# Patient Record
Sex: Female | Born: 1955
Health system: Southern US, Community
[De-identification: ages and names within clinical notes are randomized; demographics above are authoritative.]

## PROBLEM LIST (undated history)

## (undated) DIAGNOSIS — M51369 Other intervertebral disc degeneration, lumbar region without mention of lumbar back pain or lower extremity pain: Secondary | ICD-10-CM

## (undated) DIAGNOSIS — H6122 Impacted cerumen, left ear: Secondary | ICD-10-CM

## (undated) DIAGNOSIS — E785 Hyperlipidemia, unspecified: Secondary | ICD-10-CM

## (undated) DIAGNOSIS — M5136 Other intervertebral disc degeneration, lumbar region: Secondary | ICD-10-CM

## (undated) HISTORY — DX: Impacted cerumen, left ear: H61.22

## (undated) HISTORY — DX: Hyperlipidemia, unspecified: E78.5

## (undated) HISTORY — PX: TONSILLECTOMY: SUR1361

## (undated) HISTORY — DX: Other intervertebral disc degeneration, lumbar region without mention of lumbar back pain or lower extremity pain: M51.369

## (undated) HISTORY — PX: ABDOMINAL HYSTERECTOMY: SHX81

## (undated) HISTORY — PX: LAPAROTOMY: SHX154

## (undated) HISTORY — PX: APPENDECTOMY: SHX54

## (undated) HISTORY — DX: Other intervertebral disc degeneration, lumbar region: M51.36

## (undated) HISTORY — PX: JOINT REPLACEMENT: SHX530

---

## 2011-08-15 DIAGNOSIS — R609 Edema, unspecified: Secondary | ICD-10-CM | POA: Diagnosis not present

## 2011-08-17 DIAGNOSIS — N289 Disorder of kidney and ureter, unspecified: Secondary | ICD-10-CM | POA: Diagnosis not present

## 2011-08-17 DIAGNOSIS — R609 Edema, unspecified: Secondary | ICD-10-CM | POA: Diagnosis not present

## 2011-08-17 DIAGNOSIS — N23 Unspecified renal colic: Secondary | ICD-10-CM | POA: Diagnosis not present

## 2011-08-18 DIAGNOSIS — M7989 Other specified soft tissue disorders: Secondary | ICD-10-CM | POA: Diagnosis not present

## 2011-08-28 DIAGNOSIS — M7989 Other specified soft tissue disorders: Secondary | ICD-10-CM | POA: Diagnosis not present

## 2011-08-28 DIAGNOSIS — R609 Edema, unspecified: Secondary | ICD-10-CM | POA: Diagnosis not present

## 2011-09-05 DIAGNOSIS — R609 Edema, unspecified: Secondary | ICD-10-CM | POA: Diagnosis not present

## 2011-09-19 DIAGNOSIS — R609 Edema, unspecified: Secondary | ICD-10-CM | POA: Diagnosis not present

## 2011-09-27 DIAGNOSIS — F3131 Bipolar disorder, current episode depressed, mild: Secondary | ICD-10-CM | POA: Diagnosis not present

## 2011-10-03 DIAGNOSIS — K219 Gastro-esophageal reflux disease without esophagitis: Secondary | ICD-10-CM | POA: Diagnosis not present

## 2011-10-03 DIAGNOSIS — R609 Edema, unspecified: Secondary | ICD-10-CM | POA: Diagnosis not present

## 2011-10-16 DIAGNOSIS — R609 Edema, unspecified: Secondary | ICD-10-CM | POA: Diagnosis not present

## 2011-10-24 DIAGNOSIS — F3131 Bipolar disorder, current episode depressed, mild: Secondary | ICD-10-CM | POA: Diagnosis not present

## 2011-10-31 DIAGNOSIS — R609 Edema, unspecified: Secondary | ICD-10-CM | POA: Diagnosis not present

## 2012-01-05 DIAGNOSIS — R609 Edema, unspecified: Secondary | ICD-10-CM | POA: Diagnosis not present

## 2012-01-05 DIAGNOSIS — R079 Chest pain, unspecified: Secondary | ICD-10-CM | POA: Diagnosis not present

## 2012-01-06 DIAGNOSIS — R079 Chest pain, unspecified: Secondary | ICD-10-CM | POA: Diagnosis not present

## 2012-01-10 DIAGNOSIS — I872 Venous insufficiency (chronic) (peripheral): Secondary | ICD-10-CM | POA: Diagnosis not present

## 2012-01-10 DIAGNOSIS — R638 Other symptoms and signs concerning food and fluid intake: Secondary | ICD-10-CM | POA: Diagnosis not present

## 2012-01-10 DIAGNOSIS — F17200 Nicotine dependence, unspecified, uncomplicated: Secondary | ICD-10-CM | POA: Diagnosis not present

## 2012-01-10 DIAGNOSIS — E7800 Pure hypercholesterolemia, unspecified: Secondary | ICD-10-CM | POA: Diagnosis not present

## 2012-01-15 DIAGNOSIS — N3289 Other specified disorders of bladder: Secondary | ICD-10-CM | POA: Diagnosis not present

## 2012-01-18 DIAGNOSIS — E785 Hyperlipidemia, unspecified: Secondary | ICD-10-CM | POA: Diagnosis not present

## 2012-01-18 DIAGNOSIS — I10 Essential (primary) hypertension: Secondary | ICD-10-CM | POA: Diagnosis not present

## 2012-01-18 DIAGNOSIS — F17200 Nicotine dependence, unspecified, uncomplicated: Secondary | ICD-10-CM | POA: Diagnosis not present

## 2012-01-18 DIAGNOSIS — I872 Venous insufficiency (chronic) (peripheral): Secondary | ICD-10-CM | POA: Diagnosis not present

## 2012-01-18 DIAGNOSIS — F319 Bipolar disorder, unspecified: Secondary | ICD-10-CM | POA: Diagnosis not present

## 2012-01-22 DIAGNOSIS — F3131 Bipolar disorder, current episode depressed, mild: Secondary | ICD-10-CM | POA: Diagnosis not present

## 2012-01-29 DIAGNOSIS — F39 Unspecified mood [affective] disorder: Secondary | ICD-10-CM | POA: Diagnosis not present

## 2012-02-01 DIAGNOSIS — R112 Nausea with vomiting, unspecified: Secondary | ICD-10-CM | POA: Diagnosis not present

## 2012-02-01 DIAGNOSIS — R638 Other symptoms and signs concerning food and fluid intake: Secondary | ICD-10-CM | POA: Diagnosis not present

## 2012-02-01 DIAGNOSIS — I10 Essential (primary) hypertension: Secondary | ICD-10-CM | POA: Diagnosis not present

## 2012-02-01 DIAGNOSIS — F319 Bipolar disorder, unspecified: Secondary | ICD-10-CM | POA: Diagnosis not present

## 2012-02-01 DIAGNOSIS — R609 Edema, unspecified: Secondary | ICD-10-CM | POA: Diagnosis not present

## 2012-02-01 DIAGNOSIS — F17200 Nicotine dependence, unspecified, uncomplicated: Secondary | ICD-10-CM | POA: Diagnosis not present

## 2012-02-05 DIAGNOSIS — F39 Unspecified mood [affective] disorder: Secondary | ICD-10-CM | POA: Diagnosis not present

## 2012-02-07 DIAGNOSIS — M549 Dorsalgia, unspecified: Secondary | ICD-10-CM | POA: Diagnosis not present

## 2012-02-09 DIAGNOSIS — R1031 Right lower quadrant pain: Secondary | ICD-10-CM | POA: Diagnosis not present

## 2012-02-13 DIAGNOSIS — F17200 Nicotine dependence, unspecified, uncomplicated: Secondary | ICD-10-CM | POA: Diagnosis not present

## 2012-02-13 DIAGNOSIS — R609 Edema, unspecified: Secondary | ICD-10-CM | POA: Diagnosis not present

## 2012-02-13 DIAGNOSIS — M549 Dorsalgia, unspecified: Secondary | ICD-10-CM | POA: Diagnosis not present

## 2012-02-13 DIAGNOSIS — R209 Unspecified disturbances of skin sensation: Secondary | ICD-10-CM | POA: Diagnosis not present

## 2012-02-13 DIAGNOSIS — I889 Nonspecific lymphadenitis, unspecified: Secondary | ICD-10-CM | POA: Diagnosis not present

## 2012-02-14 DIAGNOSIS — F39 Unspecified mood [affective] disorder: Secondary | ICD-10-CM | POA: Diagnosis not present

## 2012-02-19 DIAGNOSIS — R609 Edema, unspecified: Secondary | ICD-10-CM | POA: Diagnosis not present

## 2012-02-19 DIAGNOSIS — I889 Nonspecific lymphadenitis, unspecified: Secondary | ICD-10-CM | POA: Diagnosis not present

## 2012-02-20 DIAGNOSIS — F39 Unspecified mood [affective] disorder: Secondary | ICD-10-CM | POA: Diagnosis not present

## 2012-02-22 DIAGNOSIS — R609 Edema, unspecified: Secondary | ICD-10-CM | POA: Diagnosis not present

## 2012-02-22 DIAGNOSIS — G589 Mononeuropathy, unspecified: Secondary | ICD-10-CM | POA: Diagnosis not present

## 2012-02-22 DIAGNOSIS — E876 Hypokalemia: Secondary | ICD-10-CM | POA: Diagnosis not present

## 2012-02-22 DIAGNOSIS — F17200 Nicotine dependence, unspecified, uncomplicated: Secondary | ICD-10-CM | POA: Diagnosis not present

## 2012-02-22 DIAGNOSIS — R638 Other symptoms and signs concerning food and fluid intake: Secondary | ICD-10-CM | POA: Diagnosis not present

## 2012-02-22 DIAGNOSIS — F319 Bipolar disorder, unspecified: Secondary | ICD-10-CM | POA: Diagnosis not present

## 2012-02-27 DIAGNOSIS — Z8249 Family history of ischemic heart disease and other diseases of the circulatory system: Secondary | ICD-10-CM | POA: Diagnosis not present

## 2012-02-27 DIAGNOSIS — R609 Edema, unspecified: Secondary | ICD-10-CM | POA: Diagnosis not present

## 2012-02-27 DIAGNOSIS — F39 Unspecified mood [affective] disorder: Secondary | ICD-10-CM | POA: Diagnosis not present

## 2012-02-27 DIAGNOSIS — E7800 Pure hypercholesterolemia, unspecified: Secondary | ICD-10-CM | POA: Diagnosis not present

## 2012-02-27 DIAGNOSIS — J449 Chronic obstructive pulmonary disease, unspecified: Secondary | ICD-10-CM | POA: Diagnosis not present

## 2012-03-20 DIAGNOSIS — M7989 Other specified soft tissue disorders: Secondary | ICD-10-CM | POA: Diagnosis not present

## 2012-03-26 DIAGNOSIS — I509 Heart failure, unspecified: Secondary | ICD-10-CM | POA: Diagnosis not present

## 2012-03-26 DIAGNOSIS — I10 Essential (primary) hypertension: Secondary | ICD-10-CM | POA: Diagnosis not present

## 2012-03-26 DIAGNOSIS — E785 Hyperlipidemia, unspecified: Secondary | ICD-10-CM | POA: Diagnosis not present

## 2012-03-26 DIAGNOSIS — F3130 Bipolar disorder, current episode depressed, mild or moderate severity, unspecified: Secondary | ICD-10-CM | POA: Diagnosis not present

## 2012-03-27 DIAGNOSIS — I10 Essential (primary) hypertension: Secondary | ICD-10-CM | POA: Diagnosis not present

## 2012-03-27 DIAGNOSIS — I509 Heart failure, unspecified: Secondary | ICD-10-CM | POA: Diagnosis not present

## 2012-03-27 DIAGNOSIS — F17200 Nicotine dependence, unspecified, uncomplicated: Secondary | ICD-10-CM | POA: Diagnosis not present

## 2012-03-27 DIAGNOSIS — R05 Cough: Secondary | ICD-10-CM | POA: Diagnosis not present

## 2012-03-27 DIAGNOSIS — R059 Cough, unspecified: Secondary | ICD-10-CM | POA: Diagnosis not present

## 2012-04-11 DIAGNOSIS — F319 Bipolar disorder, unspecified: Secondary | ICD-10-CM | POA: Diagnosis not present

## 2012-04-11 DIAGNOSIS — F17200 Nicotine dependence, unspecified, uncomplicated: Secondary | ICD-10-CM | POA: Diagnosis not present

## 2012-04-11 DIAGNOSIS — R638 Other symptoms and signs concerning food and fluid intake: Secondary | ICD-10-CM | POA: Diagnosis not present

## 2012-04-11 DIAGNOSIS — E876 Hypokalemia: Secondary | ICD-10-CM | POA: Diagnosis not present

## 2012-04-11 DIAGNOSIS — R609 Edema, unspecified: Secondary | ICD-10-CM | POA: Diagnosis not present

## 2012-04-17 DIAGNOSIS — F3130 Bipolar disorder, current episode depressed, mild or moderate severity, unspecified: Secondary | ICD-10-CM | POA: Diagnosis not present

## 2012-04-25 DIAGNOSIS — F329 Major depressive disorder, single episode, unspecified: Secondary | ICD-10-CM | POA: Diagnosis not present

## 2012-04-25 DIAGNOSIS — G589 Mononeuropathy, unspecified: Secondary | ICD-10-CM | POA: Diagnosis not present

## 2012-04-25 DIAGNOSIS — C467 Kaposi's sarcoma of other sites: Secondary | ICD-10-CM | POA: Diagnosis not present

## 2012-04-25 DIAGNOSIS — F319 Bipolar disorder, unspecified: Secondary | ICD-10-CM | POA: Diagnosis not present

## 2012-04-25 DIAGNOSIS — M6281 Muscle weakness (generalized): Secondary | ICD-10-CM | POA: Diagnosis not present

## 2012-04-25 DIAGNOSIS — F17200 Nicotine dependence, unspecified, uncomplicated: Secondary | ICD-10-CM | POA: Diagnosis not present

## 2012-05-15 DIAGNOSIS — F3130 Bipolar disorder, current episode depressed, mild or moderate severity, unspecified: Secondary | ICD-10-CM | POA: Diagnosis not present

## 2012-05-16 DIAGNOSIS — F3130 Bipolar disorder, current episode depressed, mild or moderate severity, unspecified: Secondary | ICD-10-CM | POA: Diagnosis not present

## 2012-05-23 DIAGNOSIS — F17200 Nicotine dependence, unspecified, uncomplicated: Secondary | ICD-10-CM | POA: Diagnosis not present

## 2012-05-23 DIAGNOSIS — R638 Other symptoms and signs concerning food and fluid intake: Secondary | ICD-10-CM | POA: Diagnosis not present

## 2012-05-23 DIAGNOSIS — K219 Gastro-esophageal reflux disease without esophagitis: Secondary | ICD-10-CM | POA: Diagnosis not present

## 2012-05-23 DIAGNOSIS — F319 Bipolar disorder, unspecified: Secondary | ICD-10-CM | POA: Diagnosis not present

## 2012-05-30 DIAGNOSIS — Z01419 Encounter for gynecological examination (general) (routine) without abnormal findings: Secondary | ICD-10-CM | POA: Diagnosis not present

## 2012-06-20 DIAGNOSIS — F3130 Bipolar disorder, current episode depressed, mild or moderate severity, unspecified: Secondary | ICD-10-CM | POA: Diagnosis not present

## 2012-06-20 DIAGNOSIS — F411 Generalized anxiety disorder: Secondary | ICD-10-CM | POA: Diagnosis not present

## 2012-07-09 DIAGNOSIS — F3130 Bipolar disorder, current episode depressed, mild or moderate severity, unspecified: Secondary | ICD-10-CM | POA: Diagnosis not present

## 2012-07-09 DIAGNOSIS — F411 Generalized anxiety disorder: Secondary | ICD-10-CM | POA: Diagnosis not present

## 2012-07-18 DIAGNOSIS — F3130 Bipolar disorder, current episode depressed, mild or moderate severity, unspecified: Secondary | ICD-10-CM | POA: Diagnosis not present

## 2012-07-18 DIAGNOSIS — F411 Generalized anxiety disorder: Secondary | ICD-10-CM | POA: Diagnosis not present

## 2012-07-30 DIAGNOSIS — F3130 Bipolar disorder, current episode depressed, mild or moderate severity, unspecified: Secondary | ICD-10-CM | POA: Diagnosis not present

## 2012-07-30 DIAGNOSIS — F411 Generalized anxiety disorder: Secondary | ICD-10-CM | POA: Diagnosis not present

## 2012-08-15 DIAGNOSIS — F3130 Bipolar disorder, current episode depressed, mild or moderate severity, unspecified: Secondary | ICD-10-CM | POA: Diagnosis not present

## 2012-08-15 DIAGNOSIS — F411 Generalized anxiety disorder: Secondary | ICD-10-CM | POA: Diagnosis not present

## 2012-09-03 DIAGNOSIS — F411 Generalized anxiety disorder: Secondary | ICD-10-CM | POA: Diagnosis not present

## 2012-09-03 DIAGNOSIS — F3130 Bipolar disorder, current episode depressed, mild or moderate severity, unspecified: Secondary | ICD-10-CM | POA: Diagnosis not present

## 2012-09-26 DIAGNOSIS — F3130 Bipolar disorder, current episode depressed, mild or moderate severity, unspecified: Secondary | ICD-10-CM | POA: Diagnosis not present

## 2012-09-26 DIAGNOSIS — F411 Generalized anxiety disorder: Secondary | ICD-10-CM | POA: Diagnosis not present

## 2012-10-03 DIAGNOSIS — E876 Hypokalemia: Secondary | ICD-10-CM | POA: Diagnosis not present

## 2012-10-03 DIAGNOSIS — B359 Dermatophytosis, unspecified: Secondary | ICD-10-CM | POA: Diagnosis not present

## 2012-10-03 DIAGNOSIS — Z23 Encounter for immunization: Secondary | ICD-10-CM | POA: Diagnosis not present

## 2012-10-03 DIAGNOSIS — R638 Other symptoms and signs concerning food and fluid intake: Secondary | ICD-10-CM | POA: Diagnosis not present

## 2012-10-03 DIAGNOSIS — F319 Bipolar disorder, unspecified: Secondary | ICD-10-CM | POA: Diagnosis not present

## 2012-11-07 DIAGNOSIS — F3130 Bipolar disorder, current episode depressed, mild or moderate severity, unspecified: Secondary | ICD-10-CM | POA: Diagnosis not present

## 2012-11-07 DIAGNOSIS — F411 Generalized anxiety disorder: Secondary | ICD-10-CM | POA: Diagnosis not present

## 2012-11-28 DIAGNOSIS — F3130 Bipolar disorder, current episode depressed, mild or moderate severity, unspecified: Secondary | ICD-10-CM | POA: Diagnosis not present

## 2012-11-28 DIAGNOSIS — F411 Generalized anxiety disorder: Secondary | ICD-10-CM | POA: Diagnosis not present

## 2012-12-19 DIAGNOSIS — F3130 Bipolar disorder, current episode depressed, mild or moderate severity, unspecified: Secondary | ICD-10-CM | POA: Diagnosis not present

## 2012-12-19 DIAGNOSIS — F411 Generalized anxiety disorder: Secondary | ICD-10-CM | POA: Diagnosis not present

## 2013-01-23 DIAGNOSIS — F411 Generalized anxiety disorder: Secondary | ICD-10-CM | POA: Diagnosis not present

## 2013-01-23 DIAGNOSIS — F3130 Bipolar disorder, current episode depressed, mild or moderate severity, unspecified: Secondary | ICD-10-CM | POA: Diagnosis not present

## 2013-02-18 DIAGNOSIS — F411 Generalized anxiety disorder: Secondary | ICD-10-CM | POA: Diagnosis not present

## 2013-02-18 DIAGNOSIS — F21 Schizotypal disorder: Secondary | ICD-10-CM | POA: Diagnosis not present

## 2013-02-24 DIAGNOSIS — M549 Dorsalgia, unspecified: Secondary | ICD-10-CM | POA: Diagnosis not present

## 2013-03-05 DIAGNOSIS — M549 Dorsalgia, unspecified: Secondary | ICD-10-CM | POA: Diagnosis not present

## 2013-03-05 DIAGNOSIS — R638 Other symptoms and signs concerning food and fluid intake: Secondary | ICD-10-CM | POA: Diagnosis not present

## 2013-03-05 DIAGNOSIS — E876 Hypokalemia: Secondary | ICD-10-CM | POA: Diagnosis not present

## 2013-03-05 DIAGNOSIS — F319 Bipolar disorder, unspecified: Secondary | ICD-10-CM | POA: Diagnosis not present

## 2013-04-17 DIAGNOSIS — F3130 Bipolar disorder, current episode depressed, mild or moderate severity, unspecified: Secondary | ICD-10-CM | POA: Diagnosis not present

## 2013-04-17 DIAGNOSIS — F411 Generalized anxiety disorder: Secondary | ICD-10-CM | POA: Diagnosis not present

## 2013-05-08 DIAGNOSIS — F17200 Nicotine dependence, unspecified, uncomplicated: Secondary | ICD-10-CM | POA: Diagnosis not present

## 2013-05-08 DIAGNOSIS — F411 Generalized anxiety disorder: Secondary | ICD-10-CM | POA: Diagnosis not present

## 2013-05-08 DIAGNOSIS — Z9889 Other specified postprocedural states: Secondary | ICD-10-CM | POA: Diagnosis not present

## 2013-05-08 DIAGNOSIS — K044 Acute apical periodontitis of pulpal origin: Secondary | ICD-10-CM | POA: Diagnosis not present

## 2013-05-08 DIAGNOSIS — R030 Elevated blood-pressure reading, without diagnosis of hypertension: Secondary | ICD-10-CM | POA: Diagnosis not present

## 2013-05-08 DIAGNOSIS — Z79899 Other long term (current) drug therapy: Secondary | ICD-10-CM | POA: Diagnosis not present

## 2013-05-08 DIAGNOSIS — R220 Localized swelling, mass and lump, head: Secondary | ICD-10-CM | POA: Diagnosis not present

## 2013-05-08 DIAGNOSIS — F319 Bipolar disorder, unspecified: Secondary | ICD-10-CM | POA: Diagnosis not present

## 2013-07-09 ENCOUNTER — Encounter: Payer: Self-pay | Admitting: Physician Assistant

## 2013-07-09 ENCOUNTER — Ambulatory Visit (INDEPENDENT_AMBULATORY_CARE_PROVIDER_SITE_OTHER): Payer: Medicare Other | Admitting: Physician Assistant

## 2013-07-09 VITALS — BP 119/72 | HR 92 | Ht 65.0 in | Wt 195.0 lb

## 2013-07-09 DIAGNOSIS — M5137 Other intervertebral disc degeneration, lumbosacral region: Secondary | ICD-10-CM | POA: Insufficient documentation

## 2013-07-09 DIAGNOSIS — Z79899 Other long term (current) drug therapy: Secondary | ICD-10-CM

## 2013-07-09 DIAGNOSIS — M51379 Other intervertebral disc degeneration, lumbosacral region without mention of lumbar back pain or lower extremity pain: Secondary | ICD-10-CM

## 2013-07-09 DIAGNOSIS — M5430 Sciatica, unspecified side: Secondary | ICD-10-CM

## 2013-07-09 DIAGNOSIS — E785 Hyperlipidemia, unspecified: Secondary | ICD-10-CM

## 2013-07-09 DIAGNOSIS — Z131 Encounter for screening for diabetes mellitus: Secondary | ICD-10-CM

## 2013-07-09 DIAGNOSIS — K219 Gastro-esophageal reflux disease without esophagitis: Secondary | ICD-10-CM | POA: Insufficient documentation

## 2013-07-09 DIAGNOSIS — M5440 Lumbago with sciatica, unspecified side: Secondary | ICD-10-CM | POA: Insufficient documentation

## 2013-07-09 DIAGNOSIS — F315 Bipolar disorder, current episode depressed, severe, with psychotic features: Secondary | ICD-10-CM | POA: Insufficient documentation

## 2013-07-09 DIAGNOSIS — F319 Bipolar disorder, unspecified: Secondary | ICD-10-CM

## 2013-07-09 MED ORDER — HYDROCODONE-ACETAMINOPHEN 5-325 MG PO TABS: 1.0000 | ORAL_TABLET | ORAL | Status: DC | PRN

## 2013-07-09 MED ORDER — PREDNISONE 50 MG PO TABS: ORAL_TABLET | ORAL | Status: DC

## 2013-07-09 MED ORDER — CYCLOBENZAPRINE HCL 10 MG PO TABS: 10.0000 mg | ORAL_TABLET | Freq: Three times a day (TID) | ORAL | Status: DC | PRN

## 2013-07-09 MED ORDER — OMEPRAZOLE 40 MG PO CPDR: 40.0000 mg | DELAYED_RELEASE_CAPSULE | Freq: Every day | ORAL | Status: DC

## 2013-07-09 MED ORDER — KETOROLAC TROMETHAMINE 30 MG/ML IJ SOLN
30.0000 mg | Freq: Once | INTRAMUSCULAR | Status: AC
  Administered 2013-07-09: 30 mg via INTRAMUSCULAR

## 2013-07-09 NOTE — Progress Notes (Signed)
Subjective:    Patient ID: Amber Rice, female    DOB: [birth year], 57 y.o.   MRN: [id number]  HPI Patient is a 57 year old female who presents to the clinic to establish care. Patient has a past medical history positive for hyperlipidemia and lower back problems. Per patient she has been diagnosed with herniated disc as well as degenerative disc disease of lumbar spine. Per patient she was told she needed surgery but declined surgery. She did try PT for 6 weeks with no relief. She has not had epidural injections but has had trigger point injections. Her back pain is bearable most of the time for for the past 3 weeks has been severe. Patient had Vicodin left over from tooth abscess and has been using it. It does make her nauseated but it helps with pain. She does not tolerate narcotics very well. Due to being on lithium she does not take a lot of ibuprofen. She mainly takes Tylenol when needed for pain. She recently moved down here from [place] and has been doing a lot of heavy lifting. She feels like her back is spasming. Since she has been done here she has been nonactive. She reports she lays in the bed or on the couch most of the day. She feels like she cannot get up and moving. Patient denies any saddle anesthesia or bowel/bladder dysfunction. She does have areas of numbness on her walk bilateral lateral legs. She does occasionally have pain that radiates down her legs into her knees.   Patient also has a lot of psychiatric issues with alcohol or. She has appointment with Dr. Demetrius Charity. down stairs for her psychiatry needs. She does feel well controlled on medications today.  GERD-well-controlled on Prilosec.  Patient does have a history of hyperlipidemia. She tried Lipitor and it decreased her cholesterol to call severe leg swelling. She has not tried any other statins since.  Patient currently smokes daily for the last 42 years.     Review of Systems  All other systems reviewed and are  negative.       Objective:   Physical Exam  Constitutional: She is oriented to person, place, and time. She appears well-developed and well-nourished.  Overweight. Patient not well kept today.  HENT:  Head: Normocephalic and atraumatic.  Cardiovascular: Normal rate, regular rhythm and normal heart sounds.   Pulmonary/Chest: Effort normal and breath sounds normal.  Abdominal: Soft. Bowel sounds are normal.  Musculoskeletal:  Pain with palpation over her lumbar spine. Paraspinous muscles around lumbar spine were very tight. Positive straight leg test bilaterally. Strength 5 out of 5 bilateral legs.   Neurological: She is alert and oriented to person, place, and time.  Skin: Skin is warm and dry.  Psychiatric: She has a normal mood and affect. Her behavior is normal.          Assessment & Plan:  Low back pain with sciatica /DDD Leveda Anna herniation - no red flags present with today's exam. will wait to get medical records to see what type of workup has been done. I will give a dose of prednisone as well as Flexeril. hydrocodone were given for breakthrough pain. A shot of Toradol 30 mg was given in office today. Patient was encouraged to stay away from regular NSAID use do to lithium contraindication. Patient can use Tylenol as needed for pain. Encouraged patient to stay active and work on range of motion exercises. At some point if pain not improving and not being able to be mobile  we need to consider more intervention with her back pain such as epidural injections or reconsidering surgery.   Hyperlipidemia-I would like to recheck LDL today. Patient aware she's been fasting. Discuss Livalo to try. Will wait to see LDL.   GERD-refilled Prozac well controlled today.  Bipolar-will be managed by psychiatrist.  Patient has not had any health maintenance done recently. She is very against interventional screening. We'll continue to work with her about getting mammogram/colonoscopy/flu  shot.

## 2013-07-09 NOTE — Addendum Note (Signed)
Addended by: Donne Anon L on: 07/09/2013 01:28 PM   Modules accepted: Orders

## 2013-07-11 ENCOUNTER — Encounter (HOSPITAL_COMMUNITY): Payer: Self-pay | Admitting: Psychiatry

## 2013-07-11 ENCOUNTER — Ambulatory Visit (INDEPENDENT_AMBULATORY_CARE_PROVIDER_SITE_OTHER): Payer: Medicare Other | Admitting: Psychiatry

## 2013-07-11 VITALS — BP 126/73 | HR 95 | Ht 63.5 in | Wt 197.0 lb

## 2013-07-11 DIAGNOSIS — F312 Bipolar disorder, current episode manic severe with psychotic features: Secondary | ICD-10-CM

## 2013-07-11 MED ORDER — HALOPERIDOL 1 MG PO TABS: 1.0000 mg | ORAL_TABLET | Freq: Three times a day (TID) | ORAL | Status: DC

## 2013-07-11 MED ORDER — SERTRALINE HCL 100 MG PO TABS: 100.0000 mg | ORAL_TABLET | Freq: Two times a day (BID) | ORAL | Status: DC

## 2013-07-11 MED ORDER — LITHIUM CARBONATE 300 MG PO CAPS: 300.0000 mg | ORAL_CAPSULE | Freq: Two times a day (BID) | ORAL | Status: DC
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
## 2013-07-15 ENCOUNTER — Telehealth (HOSPITAL_COMMUNITY): Payer: Self-pay

## 2013-07-15 DIAGNOSIS — F312 Bipolar disorder, current episode manic severe with psychotic features: Secondary | ICD-10-CM

## 2013-07-16 DIAGNOSIS — E785 Hyperlipidemia, unspecified: Secondary | ICD-10-CM | POA: Diagnosis not present

## 2013-07-16 DIAGNOSIS — Z79899 Other long term (current) drug therapy: Secondary | ICD-10-CM | POA: Diagnosis not present

## 2013-07-16 DIAGNOSIS — Z131 Encounter for screening for diabetes mellitus: Secondary | ICD-10-CM | POA: Diagnosis not present

## 2013-07-16 LAB — LIPID PANEL
Cholesterol: 363 mg/dL — ABNORMAL HIGH (ref 0–200)
HDL: 64 mg/dL (ref >39)
LDL Cholesterol: 236 mg/dL — ABNORMAL HIGH (ref 0–99)
Total CHOL/HDL Ratio: 5.7 Ratio
Triglycerides: 314 mg/dL — ABNORMAL HIGH (ref <150)

## 2013-07-16 LAB — COMPLETE METABOLIC PANEL WITH GFR
AST: 11 U/L (ref 0–37)
Alkaline Phosphatase: 82 U/L (ref 39–117)
BUN: 16 mg/dL (ref 6–23)
CO2: 25 mEq/L (ref 19–32)
Calcium: 10 mg/dL (ref 8.4–10.5)
Creat: 0.88 mg/dL (ref 0.50–1.10)
Sodium: 137 mEq/L (ref 135–145)
Total Bilirubin: 0.5 mg/dL (ref 0.3–1.2)
Total Protein: 7 g/dL (ref 6.0–8.3)

## 2013-07-16 MED ORDER — HALOPERIDOL 1 MG PO TABS: 1.0000 mg | ORAL_TABLET | Freq: Three times a day (TID) | ORAL | Status: DC

## 2013-07-16 NOTE — Telephone Encounter (Signed)
Sent in new prescription of Haldol, informed patient of the same.

## 2013-07-18 ENCOUNTER — Telehealth: Payer: Self-pay | Admitting: *Deleted

## 2013-07-18 MED ORDER — PITAVASTATIN CALCIUM 4 MG PO TABS: 4.0000 mg | ORAL_TABLET | Freq: Every day | ORAL | Status: DC

## 2013-07-18 NOTE — Addendum Note (Signed)
Addended by: Donne Anon L on: 07/18/2013 10:08 AM   Modules accepted: Orders

## 2013-07-21 ENCOUNTER — Telehealth: Payer: Self-pay | Admitting: *Deleted

## 2013-07-21 ENCOUNTER — Other Ambulatory Visit: Payer: Self-pay | Admitting: Physician Assistant

## 2013-07-21 MED ORDER — SIMVASTATIN 40 MG PO TABS: 40.0000 mg | ORAL_TABLET | Freq: Every day | ORAL | Status: DC

## 2013-07-21 NOTE — Telephone Encounter (Signed)
Pt notified & is willing to try zocor.

## 2013-07-21 NOTE — Telephone Encounter (Signed)
Call pt: livalo was denied until failed two first line statins. Will she be willing to try zocor daily and if failed we can get livalo approved?

## 2013-07-21 NOTE — Telephone Encounter (Signed)
Ok sent to pharmacy

## 2013-07-21 NOTE — Telephone Encounter (Signed)
Ok i will put paperwork in box to list tried and failed lipitor and zocor.

## 2013-07-21 NOTE — Telephone Encounter (Signed)
Pt left message stating that she has in fact tried zocor a while back.  She stated that she did not react bad to it; it was just ineffective.

## 2013-07-28 ENCOUNTER — Telehealth: Payer: Self-pay | Admitting: *Deleted

## 2013-07-28 NOTE — Telephone Encounter (Signed)
Pt left message stating that she has tried the zocor & it made her tired & achy all over.  Is this enough for [place] to resend the PA for the Livalo?

## 2013-07-28 NOTE — Telephone Encounter (Signed)
I added to intolerance list. Yes send info in to get prior approval.

## 2013-08-14 NOTE — Telephone Encounter (Signed)
All information was resent to ins co for PA.

## 2013-08-20 ENCOUNTER — Other Ambulatory Visit (HOSPITAL_COMMUNITY): Payer: Self-pay | Admitting: Psychiatry

## 2013-08-20 ENCOUNTER — Ambulatory Visit (INDEPENDENT_AMBULATORY_CARE_PROVIDER_SITE_OTHER): Payer: Medicare Other | Admitting: Psychiatry

## 2013-08-20 ENCOUNTER — Encounter (HOSPITAL_COMMUNITY): Payer: Self-pay | Admitting: Psychiatry

## 2013-08-20 VITALS — BP 117/67 | HR 93 | Wt 199.0 lb

## 2013-08-20 DIAGNOSIS — F312 Bipolar disorder, current episode manic severe with psychotic features: Secondary | ICD-10-CM

## 2013-08-20 MED ORDER — SERTRALINE HCL 100 MG PO TABS: 100.0000 mg | ORAL_TABLET | Freq: Every day | ORAL | Status: DC

## 2013-08-20 MED ORDER — HALOPERIDOL 1 MG PO TABS: 3.0000 mg | ORAL_TABLET | Freq: Every day | ORAL | Status: DC

## 2013-08-20 MED ORDER — LITHIUM CARBONATE ER 300 MG PO TBCR: 600.0000 mg | EXTENDED_RELEASE_TABLET | Freq: Every day | ORAL | Status: DC
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
## 2013-08-21 LAB — LITHIUM LEVEL: Lithium Lvl: 0.4 mEq/L — ABNORMAL LOW (ref 0.80–1.40)

## 2013-08-24 LAB — T4: T4, Total: 8 ug/dL (ref 5.0–12.5)

## 2013-08-24 LAB — T3, FREE: T3, Free: 2.7 pg/mL (ref 2.3–4.2)

## 2013-08-24 LAB — T4, FREE: Free T4: 1.01 ng/dL (ref 0.80–1.80)

## 2013-08-25 ENCOUNTER — Telehealth (HOSPITAL_COMMUNITY): Payer: Self-pay | Admitting: Psychiatry

## 2013-08-25 LAB — TSH: TSH: 4.703 u[IU]/mL — ABNORMAL HIGH (ref 0.350–4.500)

## 2013-08-25 NOTE — Telephone Encounter (Signed)
Called patient. Discussed lab results.

## 2013-08-28 ENCOUNTER — Encounter: Payer: Self-pay | Admitting: *Deleted

## 2013-09-12 ENCOUNTER — Other Ambulatory Visit (HOSPITAL_COMMUNITY): Payer: Self-pay | Admitting: Psychiatry

## 2013-09-12 NOTE — Telephone Encounter (Signed)
Refused refill of IR lithium, patient has prescription of Lithium CR on file.

## 2013-09-19 ENCOUNTER — Telehealth (HOSPITAL_COMMUNITY): Payer: Self-pay | Admitting: Psychiatry

## 2013-09-19 NOTE — Telephone Encounter (Signed)
Patient picked up Zoloft per pharmacy.

## 2013-09-25 ENCOUNTER — Telehealth (HOSPITAL_COMMUNITY): Payer: Self-pay

## 2013-09-25 DIAGNOSIS — F312 Bipolar disorder, current episode manic severe with psychotic features: Secondary | ICD-10-CM

## 2013-09-25 MED ORDER — SERTRALINE HCL 100 MG PO TABS: 200.0000 mg | ORAL_TABLET | Freq: Every day | ORAL | Status: DC

## 2013-09-25 NOTE — Telephone Encounter (Signed)
Sent in a new prescription for zoloft.

## 2013-09-26 ENCOUNTER — Telehealth (HOSPITAL_COMMUNITY): Payer: Self-pay | Admitting: Psychiatry

## 2013-09-26 NOTE — Telephone Encounter (Signed)
This has already been addressed. Called pharmacy, they have the prescription ready.

## 2013-09-29 ENCOUNTER — Telehealth (HOSPITAL_COMMUNITY): Payer: Self-pay

## 2013-09-29 NOTE — Telephone Encounter (Signed)
This has already been addressed

## 2013-10-15 ENCOUNTER — Other Ambulatory Visit (HOSPITAL_COMMUNITY): Payer: Self-pay | Admitting: Psychiatry

## 2013-10-22 ENCOUNTER — Encounter (HOSPITAL_COMMUNITY): Payer: Self-pay | Admitting: Psychiatry

## 2013-10-22 ENCOUNTER — Ambulatory Visit (INDEPENDENT_AMBULATORY_CARE_PROVIDER_SITE_OTHER): Payer: Medicare Other | Admitting: Psychiatry

## 2013-10-22 ENCOUNTER — Other Ambulatory Visit (HOSPITAL_COMMUNITY): Payer: Self-pay | Admitting: Psychiatry

## 2013-10-22 VITALS — BP 113/74 | HR 109 | Wt 201.0 lb

## 2013-10-22 DIAGNOSIS — F312 Bipolar disorder, current episode manic severe with psychotic features: Secondary | ICD-10-CM | POA: Diagnosis not present

## 2013-10-22 MED ORDER — LITHIUM CARBONATE ER 300 MG PO TBCR: 600.0000 mg | EXTENDED_RELEASE_TABLET | Freq: Every day | ORAL | Status: DC

## 2013-10-22 MED ORDER — HALOPERIDOL 1 MG PO TABS: 3.0000 mg | ORAL_TABLET | Freq: Every day | ORAL | Status: DC

## 2013-10-22 MED ORDER — SERTRALINE HCL 100 MG PO TABS: 200.0000 mg | ORAL_TABLET | Freq: Every day | ORAL | Status: DC
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
## 2013-10-22 NOTE — Telephone Encounter (Signed)
Patient will be seen in clinic today

## 2013-10-23 LAB — THYROID PANEL WITH TSH
Free Thyroxine Index: 2 (ref 1.0–3.9)
T3 Uptake: 29.6 % (ref 22.5–37.0)
T4, Total: 6.8 ug/dL (ref 5.0–12.5)
TSH: 6.382 u[IU]/mL — AB (ref 0.350–4.500)

## 2013-10-23 LAB — CREATININE, SERUM: CREATININE: 0.81 mg/dL (ref 0.50–1.10)

## 2013-10-23 LAB — BUN+CREAT: BUN / CREAT RATIO: 13.6 ratio

## 2013-10-23 LAB — BUN: BUN: 11 mg/dL (ref 6–23)

## 2013-10-24 ENCOUNTER — Telehealth (HOSPITAL_COMMUNITY): Payer: Self-pay | Admitting: Psychiatry

## 2013-10-24 LAB — T3, FREE: T3, Free: 2.4 pg/mL (ref 2.3–4.2)

## 2013-10-24 LAB — T4, FREE: FREE T4: 1.12 ng/dL (ref 0.80–1.80)

## 2013-10-24 NOTE — Telephone Encounter (Signed)
Called patient reviewed labs with patient. I will forward results of labs to her PCP.

## 2013-10-24 NOTE — Telephone Encounter (Signed)
Called lab added free T4 her panel.

## 2013-10-26 NOTE — Telephone Encounter (Signed)
Thank you. I received labs and following up with her.   We will miss you.

## 2013-10-28 ENCOUNTER — Encounter: Payer: Self-pay | Admitting: Physician Assistant

## 2013-10-28 ENCOUNTER — Ambulatory Visit (INDEPENDENT_AMBULATORY_CARE_PROVIDER_SITE_OTHER): Payer: Medicare Other | Admitting: Physician Assistant

## 2013-10-28 VITALS — BP 118/77 | HR 105 | Wt 200.0 lb

## 2013-10-28 DIAGNOSIS — E061 Subacute thyroiditis: Secondary | ICD-10-CM

## 2013-10-28 DIAGNOSIS — M5430 Sciatica, unspecified side: Secondary | ICD-10-CM

## 2013-10-28 DIAGNOSIS — E785 Hyperlipidemia, unspecified: Secondary | ICD-10-CM | POA: Diagnosis not present

## 2013-10-28 DIAGNOSIS — M5440 Lumbago with sciatica, unspecified side: Secondary | ICD-10-CM

## 2013-10-28 MED ORDER — LEVOTHYROXINE SODIUM 25 MCG PO TABS: 25.0000 ug | ORAL_TABLET | Freq: Every day | ORAL | Status: DC

## 2013-10-28 MED ORDER — PITAVASTATIN CALCIUM 4 MG PO TABS: 4.0000 mg | ORAL_TABLET | Freq: Every day | ORAL | Status: DC

## 2013-10-28 MED ORDER — HYDROCODONE-ACETAMINOPHEN 5-325 MG PO TABS: 1.0000 | ORAL_TABLET | ORAL | Status: DC | PRN

## 2013-10-28 NOTE — Progress Notes (Signed)
   Subjective:    Patient ID: Amber Rice, female    DOB: 1955-10-20, 58 y.o.   MRN: [id number]  HPI Patient is a 58 year old female who presents to the clinic to discuss elevated TSH lab. She was being seen by Dr. Mamie Nick downstairs for bipolar 1 disorder. She had complained of fatigue with him and he ordered a thyroid lab that came back elevated. She admits to feeling cold a lot and having more dry skin and hair changes.  She would also like a Vicodin refill for her low back pain. She only uses when needed. She was using NSAIDs but they interfere with her lithium. She also uses Flexeril as needed. 30 tablets of Vicodin have lasted her since December 2014.  Hyperlipidemia- Patient was previously preformed level but in the new year for approval was denied. She is working on getting the approval back.   Review of Systems     Objective:   Physical Exam  Constitutional: She is oriented to person, place, and time. She appears well-developed and well-nourished.  HENT:  Head: Normocephalic and atraumatic.  Neck: Normal range of motion. Neck supple. No thyromegaly present.  Cardiovascular: Regular rhythm and normal heart sounds.   Recheck heart rate 105.  Pulmonary/Chest: Effort normal and breath sounds normal. She has no wheezes.  Lymphadenopathy:    She has no cervical adenopathy.  Neurological: She is alert and oriented to person, place, and time.  Psychiatric: She has a normal mood and affect. Her behavior is normal.          Assessment & Plan:  Subacute thyroiditis-discuss with patient that TSH was elevated;  However free T4 and T3 levels were normal. Likely these thyroid levels will resolve on their own. Since patient is symptomatic will treat with a low dose levothyroxin. Will start with 25 MCG's in the morning without any other medications and before breakfast. Discussed side effects of medication. Will recheck thyroid levels in 8 weeks.  Low back pain with sciatica-patient is not  to straining any abusive sounds. Will refill Vicodin for as needed low back pain. It is unfortunate patient is unable to take NSAIDs do to interaction with lithium. Continue to use Flexeril as needed. Encouraged patient to stay active with regular walking.  Hyperlipidemia-encouraged patient to get back on the left for LDL reduction. If there is anything we can do for approval please let us know.

## 2013-11-06 ENCOUNTER — Telehealth (HOSPITAL_COMMUNITY): Payer: Self-pay

## 2013-11-07 MED ORDER — HALOPERIDOL 1 MG PO TABS: 2.0000 mg | ORAL_TABLET | Freq: Every day | ORAL | Status: DC

## 2013-11-07 NOTE — Telephone Encounter (Signed)
Patient is tolerating Haldol at 2 mg. Will provide refills of the same.

## 2013-12-16 ENCOUNTER — Other Ambulatory Visit: Payer: Self-pay | Admitting: Physician Assistant

## 2013-12-31 ENCOUNTER — Ambulatory Visit (INDEPENDENT_AMBULATORY_CARE_PROVIDER_SITE_OTHER): Payer: Medicare Other

## 2013-12-31 ENCOUNTER — Encounter: Payer: Self-pay | Admitting: Physician Assistant

## 2013-12-31 ENCOUNTER — Ambulatory Visit (INDEPENDENT_AMBULATORY_CARE_PROVIDER_SITE_OTHER): Payer: Medicare Other | Admitting: Physician Assistant

## 2013-12-31 VITALS — BP 107/70 | HR 120 | Ht 63.5 in | Wt 200.0 lb

## 2013-12-31 DIAGNOSIS — M5430 Sciatica, unspecified side: Secondary | ICD-10-CM

## 2013-12-31 DIAGNOSIS — M5440 Lumbago with sciatica, unspecified side: Secondary | ICD-10-CM

## 2013-12-31 DIAGNOSIS — M5137 Other intervertebral disc degeneration, lumbosacral region: Secondary | ICD-10-CM

## 2013-12-31 DIAGNOSIS — M51379 Other intervertebral disc degeneration, lumbosacral region without mention of lumbar back pain or lower extremity pain: Secondary | ICD-10-CM

## 2013-12-31 DIAGNOSIS — E785 Hyperlipidemia, unspecified: Secondary | ICD-10-CM | POA: Diagnosis not present

## 2013-12-31 DIAGNOSIS — R946 Abnormal results of thyroid function studies: Secondary | ICD-10-CM | POA: Diagnosis not present

## 2013-12-31 DIAGNOSIS — R7989 Other specified abnormal findings of blood chemistry: Secondary | ICD-10-CM

## 2013-12-31 MED ORDER — HYDROCODONE-ACETAMINOPHEN 5-325 MG PO TABS: 1.0000 | ORAL_TABLET | ORAL | Status: DC | PRN

## 2013-12-31 MED ORDER — PITAVASTATIN CALCIUM 4 MG PO TABS: 4.0000 mg | ORAL_TABLET | Freq: Every day | ORAL | Status: DC

## 2013-12-31 NOTE — Patient Instructions (Signed)

## 2014-01-01 LAB — THYROID PANEL WITH TSH
Free Thyroxine Index: 2.4 (ref 1.0–3.9)
T3 Uptake: 33 % (ref 22.5–37.0)
T4, Total: 7.3 ug/dL (ref 5.0–12.5)
TSH: 5.058 u[IU]/mL — ABNORMAL HIGH (ref 0.350–4.500)

## 2014-01-02 ENCOUNTER — Encounter: Payer: Self-pay | Admitting: Physician Assistant

## 2014-01-02 ENCOUNTER — Other Ambulatory Visit: Payer: Self-pay | Admitting: Physician Assistant

## 2014-01-02 MED ORDER — LEVOTHYROXINE SODIUM 50 MCG PO TABS: 50.0000 ug | ORAL_TABLET | Freq: Every day | ORAL | Status: DC

## 2014-01-04 NOTE — Progress Notes (Signed)
   Subjective:    Patient ID: Amber Rice, female    DOB: 1955/09/11, 58 y.o.   MRN: [id number]  HPI Pt comes in to follow up on thyroid and low back pain.   Elevated tSH- no changes with addition on levothyroxine. Feels the same. No side effects to medication.   Low back pain with radiation- off and on. Hx of DDD. Does not take norco daily just as needed. When she does have pain there is some radiation down both legs but usually at different times. No ongoing numbness or tingling of legs or feet. No bowel or bladder dysfunction or saddle anesthesia.    Review of Systems  All other systems reviewed and are negative.      Objective:   Physical Exam  Constitutional: She is oriented to person, place, and time. She appears well-developed and well-nourished.  HENT:  Head: Normocephalic and atraumatic.  Neck: Normal range of motion. Neck supple. No thyromegaly present.  Cardiovascular: Normal rate, regular rhythm and normal heart sounds.   Pulmonary/Chest: Effort normal and breath sounds normal.  Neurological: She is alert and oriented to person, place, and time.  Psychiatric: She has a normal mood and affect. Her behavior is normal.          Assessment & Plan:  Elevated TSH- started low dose 52mcg of levothyroxine at last visit. Will recheck today.   Low back pain with scatica- will recheck xrays. Do not have any on copy here. Refilled norco and flexeril. Gave low back stretches to do at least twice a day. Consider referral to PT for rehab or back to improve symptoms.      Hyperlipidemia- not been taking livalo. Gave samples today and script. Discussed we have to keep her on these. Her LDL is extremely elevated and cannot tolerate other statins. Will recheck in 6 months. Discussed with pt if she has any problems getting filled then she needs to contact office. Will give samples until we figure it out.

## 2014-01-15 ENCOUNTER — Encounter (INDEPENDENT_AMBULATORY_CARE_PROVIDER_SITE_OTHER): Payer: Self-pay

## 2014-01-15 ENCOUNTER — Encounter (HOSPITAL_COMMUNITY): Payer: Self-pay | Admitting: Psychiatry

## 2014-01-15 ENCOUNTER — Ambulatory Visit (INDEPENDENT_AMBULATORY_CARE_PROVIDER_SITE_OTHER): Payer: Medicare Other | Admitting: Psychiatry

## 2014-01-15 VITALS — HR 90 | Ht 64.0 in | Wt 200.0 lb

## 2014-01-15 DIAGNOSIS — F312 Bipolar disorder, current episode manic severe with psychotic features: Secondary | ICD-10-CM

## 2014-01-15 DIAGNOSIS — F319 Bipolar disorder, unspecified: Secondary | ICD-10-CM

## 2014-01-15 DIAGNOSIS — G4700 Insomnia, unspecified: Secondary | ICD-10-CM

## 2014-01-15 MED ORDER — HALOPERIDOL 1 MG PO TABS: 2.0000 mg | ORAL_TABLET | Freq: Every day | ORAL | Status: DC

## 2014-01-15 MED ORDER — LITHIUM CARBONATE ER 300 MG PO TBCR: 600.0000 mg | EXTENDED_RELEASE_TABLET | Freq: Every day | ORAL | Status: DC

## 2014-01-15 MED ORDER — SERTRALINE HCL 100 MG PO TABS: 200.0000 mg | ORAL_TABLET | Freq: Every day | ORAL | Status: DC

## 2014-01-15 MED ORDER — HYDROXYZINE PAMOATE 25 MG PO CAPS: 100.0000 mg | ORAL_CAPSULE | Freq: Every day | ORAL | Status: DC

## 2014-01-15 NOTE — Progress Notes (Signed)
Patient ID: Amber Rice, female   DOB: 1955/09/01, 58 y.o.   MRN: [id number] [facility] Follow-up Outpatient   Amber Rice [id number] 58 y.o.  01/15/2014  Chief Complaint: Depression and history of bipolar     History of Present Illness:   Patient returns for Medication Follow up and is diagnosed with bipolar disorder. Current episode possibly depressed. April is a difficult month because of her husbands death anniversary. She currently lives alone but she has the support of her sister. She continues to take her medication. She feels her motor balance at times but has difficulty sleeping. Associated factors are stress and loneliness.  Duration of bipolar more than for 5 years  Depression scale is 4/10  She also has D. degenerative joint disease she is currently on pain medications takes when necessary. When she gets depressed she is withdrawn disturbed sleep energy appetite. She was in 2009 when she had a manic episode during which she was talking to herself her mind was racing she was spending too much time in projects and them feeling hyper elevated.  There is no involuntary movements noticeable she is tolerating medication and no reported side effects. Past Medical History  Diagnosis Date  . Hyperlipidemia    Family History  Problem Relation Age of Onset  . Cancer Mother   . Heart attack Father   . Hyperlipidemia Father   . Cancer Maternal Grandmother   . Cancer Paternal Grandmother   . Bipolar disorder Paternal Uncle     Committed suicide in his 68's  . Dementia Paternal Uncle   . Depression Cousin   . Alcohol abuse Neg Hx   . Anxiety disorder Neg Hx   . Schizophrenia Cousin   . Multiple sclerosis Sister     Outpatient Encounter Prescriptions as of 01/15/2014  Medication Sig  . cyclobenzaprine (FLEXERIL) 10 MG tablet Take 1 tablet (10 mg total) by mouth 3 (three) times daily as needed for muscle spasms.  . haloperidol (HALDOL) 1 MG tablet Take 2  tablets (2 mg total) by mouth at bedtime.  Marland Kitchen HYDROcodone-acetaminophen (NORCO/VICODIN) 5-325 MG per tablet Take 1 tablet by mouth as needed for moderate pain.  . hydrOXYzine (VISTARIL) 25 MG capsule Take 4 capsules (100 mg total) by mouth daily at 10 pm.  . levothyroxine (SYNTHROID, LEVOTHROID) 50 MCG tablet Take 1 tablet (50 mcg total) by mouth daily.  Marland Kitchen lithium carbonate (LITHOBID) 300 MG CR tablet Take 2 tablets (600 mg total) by mouth at bedtime.  Marland Kitchen omeprazole (PRILOSEC) 40 MG capsule Take 1 capsule (40 mg total) by mouth daily.  . Pitavastatin Calcium (LIVALO) 4 MG TABS Take 1 tablet (4 mg total) by mouth daily.  . promethazine (PHENERGAN) 25 MG tablet Take 25 mg by mouth as needed for nausea or vomiting.  . sertraline (ZOLOFT) 100 MG tablet Take 2 tablets (200 mg total) by mouth at bedtime.  . [DISCONTINUED] haloperidol (HALDOL) 1 MG tablet Take 2 tablets (2 mg total) by mouth at bedtime.  . [DISCONTINUED] lithium carbonate (LITHOBID) 300 MG CR tablet Take 2 tablets (600 mg total) by mouth at bedtime.  . [DISCONTINUED] sertraline (ZOLOFT) 100 MG tablet Take 2 tablets (200 mg total) by mouth at bedtime.    Recent Results (from the past 2160 hour(s))  BUN+CREAT     Status: None   Collection Time    10/22/13  3:38 PM      Result Value Ref Range   BUN/Creatinine Ratio 13.6    BUN  Status: None   Collection Time    10/22/13  3:38 PM      Result Value Ref Range   BUN 11  6 - 23 mg/dL  CREATININE, SERUM     Status: None   Collection Time    10/22/13  3:38 PM      Result Value Ref Range   Creat 0.81  0.50 - 1.10 mg/dL  THYROID PANEL WITH TSH     Status: Abnormal   Collection Time    10/22/13  3:41 PM      Result Value Ref Range   T4, Total 6.8  5.0 - 12.5 ug/dL   T3 Uptake 29.6  22.5 - 37.0 %   Free Thyroxine Index 2.0  1.0 - 3.9   TSH 6.382 (*) 0.350 - 4.500 uIU/mL  T4, FREE     Status: None   Collection Time    10/22/13  3:41 PM      Result Value Ref Range   Free T4 1.12   0.80 - 1.80 ng/dL  T3, FREE     Status: None   Collection Time    10/22/13  3:41 PM      Result Value Ref Range   T3, Free 2.4  2.3 - 4.2 pg/mL  THYROID PANEL WITH TSH     Status: Abnormal   Collection Time    12/31/13  4:08 PM      Result Value Ref Range   T4, Total 7.3  5.0 - 12.5 ug/dL   T3 Uptake 33.0  22.5 - 37.0 %   Free Thyroxine Index 2.4  1.0 - 3.9   TSH 5.058 (*) 0.350 - 4.500 uIU/mL    Pulse 90  Ht 5\' 4"  (1.626 m)  Wt 200 lb (90.719 kg)  BMI 34.31 kg/m2   Review of Systems  Eyes: Negative.   Gastrointestinal: Negative.   Musculoskeletal: Positive for myalgias.  Neurological: Negative.   Psychiatric/Behavioral: The patient is nervous/anxious and has insomnia.     Mental Status Examination  Appearance: casual Alert: Yes Attention: fair  Cooperative: Yes Eye Contact: Good Speech: normal Psychomotor Activity: Decreased Memory/Concentration: adequate Oriented: person, place, time/date and situation Mood: Euthymic Affect: Congruent Thought Processes and Associations: Coherent Fund of Knowledge: Fair Thought Content: Suicidal ideation and Homicidal ideation were denied Insight: Fair Judgement: Fair  Diagnosis: Bipolar disorder type I manic per history for psychotic features. In partial remission insomnia NOS  Treatment Plan:   Continue current prescription she follows a primary care physician regarding her physical health. She'll is also on low thyroid. Discussed sleep hygiene in detail and also start Vistaril for hydroxyzine 25 mg capsule at night  Pertinent Labs and Relevant Prior Notes reviewed. Medication Side effects, benefits and risks reviewed/discussed with Patient. Time given for patient to respond and asks questions regarding the Diagnosis and Medications. Safety concerns and to report to ER if suicidal or call 911. Relevant Medications refilled or called in to pharmacy. Discussed weight maintenance and Sleep Hygiene. Follow up with Primary  care provider in regards to Medical conditions. Recommend compliance with medications and follow up office appointments. Discussed to avail opportunity to consider or/and continue Individual therapy with Counselor. Greater than 50% of time was spend in counseling and coordination of care with the patient.  Schedule for Follow up visit in 2 months or call in earlier as necessary.  [name], MD 01/15/2014

## 2014-02-18 ENCOUNTER — Ambulatory Visit: Payer: Self-pay | Admitting: Physician Assistant

## 2014-02-20 ENCOUNTER — Encounter: Payer: Self-pay | Admitting: Physician Assistant

## 2014-02-20 ENCOUNTER — Ambulatory Visit (INDEPENDENT_AMBULATORY_CARE_PROVIDER_SITE_OTHER): Payer: Medicare Other | Admitting: Physician Assistant

## 2014-02-20 VITALS — BP 135/78 | HR 105 | Ht 63.5 in | Wt 200.0 lb

## 2014-02-20 DIAGNOSIS — E039 Hypothyroidism, unspecified: Secondary | ICD-10-CM

## 2014-02-20 DIAGNOSIS — M5441 Lumbago with sciatica, right side: Secondary | ICD-10-CM

## 2014-02-20 DIAGNOSIS — M5137 Other intervertebral disc degeneration, lumbosacral region: Secondary | ICD-10-CM

## 2014-02-20 DIAGNOSIS — M51379 Other intervertebral disc degeneration, lumbosacral region without mention of lumbar back pain or lower extremity pain: Secondary | ICD-10-CM

## 2014-02-20 DIAGNOSIS — F319 Bipolar disorder, unspecified: Secondary | ICD-10-CM

## 2014-02-20 DIAGNOSIS — M5430 Sciatica, unspecified side: Secondary | ICD-10-CM | POA: Diagnosis not present

## 2014-02-20 DIAGNOSIS — Z79899 Other long term (current) drug therapy: Secondary | ICD-10-CM | POA: Diagnosis not present

## 2014-02-20 DIAGNOSIS — Z1239 Encounter for other screening for malignant neoplasm of breast: Secondary | ICD-10-CM

## 2014-02-20 DIAGNOSIS — M5442 Lumbago with sciatica, left side: Secondary | ICD-10-CM

## 2014-02-20 DIAGNOSIS — E785 Hyperlipidemia, unspecified: Secondary | ICD-10-CM

## 2014-02-20 DIAGNOSIS — Z1211 Encounter for screening for malignant neoplasm of colon: Secondary | ICD-10-CM

## 2014-02-20 MED ORDER — HYDROCODONE-ACETAMINOPHEN 5-325 MG PO TABS: 1.0000 | ORAL_TABLET | ORAL | Status: DC | PRN

## 2014-02-20 MED ORDER — CYCLOBENZAPRINE HCL 10 MG PO TABS: 10.0000 mg | ORAL_TABLET | Freq: Three times a day (TID) | ORAL | Status: DC | PRN

## 2014-02-20 MED ORDER — PREDNISONE 50 MG PO TABS: ORAL_TABLET | ORAL | Status: DC

## 2014-02-21 LAB — COMPLETE METABOLIC PANEL WITH GFR
ALBUMIN: 4.1 g/dL (ref 3.5–5.2)
ALK PHOS: 88 U/L (ref 39–117)
ALT: 24 U/L (ref 0–35)
AST: 21 U/L (ref 0–37)
BUN: 9 mg/dL (ref 6–23)
CO2: 22 mEq/L (ref 19–32)
Calcium: 9.3 mg/dL (ref 8.4–10.5)
Chloride: 105 mEq/L (ref 96–112)
Creat: 0.92 mg/dL (ref 0.50–1.10)
GFR, Est African American: 80 mL/min
GFR, Est Non African American: 69 mL/min
Glucose, Bld: 89 mg/dL (ref 70–99)
POTASSIUM: 4.9 meq/L (ref 3.5–5.3)
SODIUM: 134 meq/L — AB (ref 135–145)
TOTAL PROTEIN: 6.7 g/dL (ref 6.0–8.3)
Total Bilirubin: 0.4 mg/dL (ref 0.2–1.2)

## 2014-02-21 LAB — TSH: TSH: 4.134 u[IU]/mL (ref 0.350–4.500)

## 2014-02-21 LAB — LITHIUM LEVEL: LITHIUM LVL: 0.4 meq/L — AB (ref 0.80–1.40)

## 2014-02-21 LAB — T4, FREE: Free T4: 1.07 ng/dL (ref 0.80–1.80)

## 2014-02-23 ENCOUNTER — Other Ambulatory Visit: Payer: Self-pay | Admitting: *Deleted

## 2014-02-23 ENCOUNTER — Telehealth: Payer: Self-pay | Admitting: *Deleted

## 2014-02-23 LAB — POC HEMOCCULT BLD/STL (HOME/3-CARD/SCREEN)
Card #2 Fecal Occult Blod, POC: NEGATIVE
Card #3 Fecal Occult Blood, POC: NEGATIVE
Fecal Occult Blood, POC: NEGATIVE

## 2014-02-23 MED ORDER — LEVOTHYROXINE SODIUM 50 MCG PO TABS: 50.0000 ug | ORAL_TABLET | Freq: Every day | ORAL | Status: DC

## 2014-02-23 NOTE — Telephone Encounter (Signed)
Prior auth obtained for Livalo 4mg .  Approval dates are 02/23/14-02/24/15.

## 2014-02-23 NOTE — Addendum Note (Signed)
Addended by: Beatris Ship L on: 02/23/2014 11:33 AM   Modules accepted: Orders

## 2014-02-23 NOTE — Progress Notes (Signed)
   Subjective:    Patient ID: Amber Rice, female    DOB: Sep 10, 1955, 58 y.o.   MRN: [id number]  HPI Pt is a 58 yo woman who comes in for medication refills.   Bipolar- managed by behavioral health.   Hypothyroidism- TSH has been elevated the last couple of times checked. Pt is on levothyroxine. Pt feels fine today. No overt tiredness, swelling, heat/cold intolerances.   DDD/Low back pain with sciatica- ongoing chronic back pain. Fell into wall on backside on June 15th. Has had worsening pain since. Localized to low back over where she bulging disc. She does have some radiation of pain into bilateral legs(alternates sides). Denies any bowel or bladder dysfunction or saddle anthesia.   Hyperlipidemia- LDL remains above 200. Not taking livalo because cost. Insurance approved once but has not approved again for this year.    Review of Systems  All other systems reviewed and are negative.      Objective:   Physical Exam  Constitutional: She is oriented to person, place, and time. She appears well-developed and well-nourished.  HENT:  Head: Normocephalic and atraumatic.  Cardiovascular: Regular rhythm and normal heart sounds.   Tachycardia at 105.   Pulmonary/Chest: Effort normal and breath sounds normal.  Neurological: She is alert and oriented to person, place, and time.  Skin: Skin is dry.  Psychiatric: She has a normal mood and affect. Her behavior is normal.          Assessment & Plan:  Hypothyroidism- will recheck labs today and rx accordingly.   DDD/low back pain with sciatica/acute injury- pt has failed PT per pt. Prednisone burst was given, flexeril, and norco for acute pain. Follow up with Dr. Darene Lamer to discuss epidural injections and other management of pain. Discussed need for another MRI. Pt would like to wait at this time. Certainly she will need one to consider epidural injections. Last MRI was 2005. Discussed naproxen twice a day for inflammation. Ice/heat area of  discomfort.   Hyperlipidemia- we are calling pharmacy and drug rep to see what we can do to get approved. No samples today. Discussed how important it is to keep LDL levels low to decrease risk for stroke/MI.    Bipolar- since we are getting bloodwork today will check lithium level and cmp. appt august 11th downstairs.   Pt agrees for mammogram. Declined colonoscopy, pap, pneumococcal vaccines. Pt aware of risk.

## 2014-02-24 ENCOUNTER — Ambulatory Visit (INDEPENDENT_AMBULATORY_CARE_PROVIDER_SITE_OTHER): Payer: Medicare Other

## 2014-02-24 DIAGNOSIS — Z1231 Encounter for screening mammogram for malignant neoplasm of breast: Secondary | ICD-10-CM | POA: Diagnosis not present

## 2014-03-17 ENCOUNTER — Ambulatory Visit (INDEPENDENT_AMBULATORY_CARE_PROVIDER_SITE_OTHER): Payer: Medicare Other | Admitting: Psychiatry

## 2014-03-17 ENCOUNTER — Encounter (INDEPENDENT_AMBULATORY_CARE_PROVIDER_SITE_OTHER): Payer: Self-pay

## 2014-03-17 DIAGNOSIS — F312 Bipolar disorder, current episode manic severe with psychotic features: Secondary | ICD-10-CM

## 2014-03-17 DIAGNOSIS — F4322 Adjustment disorder with anxiety: Secondary | ICD-10-CM

## 2014-03-17 DIAGNOSIS — F3110 Bipolar disorder, current episode manic without psychotic features, unspecified: Secondary | ICD-10-CM | POA: Diagnosis not present

## 2014-03-17 DIAGNOSIS — G4700 Insomnia, unspecified: Secondary | ICD-10-CM | POA: Diagnosis not present

## 2014-03-17 MED ORDER — LORAZEPAM 2 MG PO TABS: ORAL_TABLET | ORAL | Status: DC

## 2014-03-17 NOTE — Progress Notes (Signed)
Patient ID: Amber Rice, female   DOB: 04-29-1956, 58 y.o.   MRN: [id number] Amber Rice Follow-up Outpatient   Kelda Azad [id number] 58 y.o.  03/17/2014  Chief Complaint: Depression and history of bipolar     History of Present Illness:   Patient returns for Medication Follow up and is diagnosed with bipolar disorder. Current episode possibly depressed.  Patient has been feeling lonely and depressed. Significant financial issues and she has to move into a smaller apartment. She does have a sister who she talks with but otherwise she misses her husband her husbands anniversary was in the month of April and May. She still misses him but does not endorse hopelessness to the suicide. She is having medical issues that she gets treated stress. She takes her pain medication only if needed. She's not drinking alcohol or using drugs. Today she wanted to be on Ativan. Says that in the past when she was anxious at this point Ativan did help. I cautioned that Ativan can become addictive. She feels overly stressed and anxious does not endorse hopelessness but feels down and withdrawn. Says Mr. did not help her anxiety worse, so she stopped it. Duration of bipolar more than for 5 years  Depression scale is 4/10  Lithium level is 0.4. TSH being managed by a medical provider. She also has D. degenerative joint disease she is currently on pain medications takes when necessary. When she gets depressed she is withdrawn disturbed sleep energy appetite. She was in 2009 when she had a manic episode during which she was talking to herself her mind was racing she was spending too much time in projects and them feeling hyper elevated.  There is no involuntary movements noticeable she is tolerating medication and no reported side effects. Past Medical History  Diagnosis Date  . Hyperlipidemia    Family History  Problem Relation Age of Onset  . Cancer Mother   . Heart attack Father   .  Hyperlipidemia Father   . Cancer Maternal Grandmother   . Cancer Paternal Grandmother   . Bipolar disorder Paternal Uncle     Committed suicide in his 63's  . Dementia Paternal Uncle   . Depression Cousin   . Alcohol abuse Neg Hx   . Anxiety disorder Neg Hx   . Schizophrenia Cousin   . Multiple sclerosis Sister     Outpatient Encounter Prescriptions as of 03/17/2014  Medication Sig  . cyclobenzaprine (FLEXERIL) 10 MG tablet Take 1 tablet (10 mg total) by mouth 3 (three) times daily as needed for muscle spasms.  . haloperidol (HALDOL) 1 MG tablet Take 2 tablets (2 mg total) by mouth at bedtime.  Marland Kitchen HYDROcodone-acetaminophen (NORCO/VICODIN) 5-325 MG per tablet Take 1 tablet by mouth as needed for moderate pain.  Marland Kitchen levothyroxine (SYNTHROID, LEVOTHROID) 50 MCG tablet Take 1 tablet (50 mcg total) by mouth daily.  Marland Kitchen lithium carbonate (LITHOBID) 300 MG CR tablet Take 2 tablets (600 mg total) by mouth at bedtime.  Marland Kitchen LORazepam (ATIVAN) 2 MG tablet Take one table for anxiety if needed a day  . Pitavastatin Calcium (LIVALO) 4 MG TABS Take 1 tablet (4 mg total) by mouth daily.  . predniSONE (DELTASONE) 50 MG tablet Take one tablet for 5 days.  Marland Kitchen sertraline (ZOLOFT) 100 MG tablet Take 2 tablets (200 mg total) by mouth at bedtime.    Recent Results (from the past 2160 hour(s))  THYROID PANEL WITH TSH     Status: Abnormal   Collection Time  12/31/13  4:08 PM      Result Value Ref Range   T4, Total 7.3  5.0 - 12.5 ug/dL   T3 Uptake 33.0  22.5 - 37.0 %   Free Thyroxine Index 2.4  1.0 - 3.9   TSH 5.058 (*) 0.350 - 4.500 uIU/mL  LITHIUM LEVEL     Status: Abnormal   Collection Time    02/20/14  4:22 PM      Result Value Ref Range   Lithium Lvl 0.40 (*) 0.80 - 1.40 mEq/L  COMPLETE METABOLIC PANEL WITH GFR     Status: Abnormal   Collection Time    02/20/14  4:22 PM      Result Value Ref Range   Sodium 134 (*) 135 - 145 mEq/L   Potassium 4.9  3.5 - 5.3 mEq/L   Chloride 105  96 - 112 mEq/L    CO2 22  19 - 32 mEq/L   Glucose, Bld 89  70 - 99 mg/dL   BUN 9  6 - 23 mg/dL   Creat 0.92  0.50 - 1.10 mg/dL   Total Bilirubin 0.4  0.2 - 1.2 mg/dL   Alkaline Phosphatase 88  39 - 117 U/L   AST 21  0 - 37 U/L   ALT 24  0 - 35 U/L   Total Protein 6.7  6.0 - 8.3 g/dL   Albumin 4.1  3.5 - 5.2 g/dL   Calcium 9.3  8.4 - 10.5 mg/dL   GFR, Est African American 80     GFR, Est Non African American 69     Comment:       The estimated GFR is a calculation valid for adults (>=18 years old)     that uses the CKD-EPI algorithm to adjust for age and sex. It is       not to be used for children, pregnant women, hospitalized patients,        patients on dialysis, or with rapidly changing kidney function.     According to the NKDEP, eGFR >89 is normal, 60-89 shows mild     impairment, 30-59 shows moderate impairment, 15-29 shows severe     impairment and <15 is ESRD.        TSH     Status: None   Collection Time    02/20/14  4:22 PM      Result Value Ref Range   TSH 4.134  0.350 - 4.500 uIU/mL  T4, FREE     Status: None   Collection Time    02/20/14  4:22 PM      Result Value Ref Range   Free T4 1.07  0.80 - 1.80 ng/dL  POC HEMOCCULT BLD/STL (HOME/3-CARD/SCREEN)     Status: None   Collection Time    02/23/14 11:32 AM      Result Value Ref Range   Card #1 Date 717/15     Fecal Occult Blood, POC Negative     Card #2 Date 02/21/14     Card #2 Fecal Occult Blod, POC Negative     Card #3 Date 02/22/14     Card #3 Fecal Occult Blood, POC Negative      There were no vitals taken for this visit.   Review of Systems  Constitutional: Positive for malaise/fatigue.  Eyes: Negative.   Gastrointestinal: Negative.  Negative for nausea.  Musculoskeletal: Positive for myalgias.  Neurological: Negative.   Psychiatric/Behavioral: Positive for depression. Negative for suicidal ideas, hallucinations and substance abuse.   The patient is nervous/anxious and has insomnia.     Mental Status Examination   Appearance: casual Alert: Yes Attention: fair  Cooperative: Yes Eye Contact: Good Speech: normal Psychomotor Activity: Decreased Memory/Concentration: adequate Oriented: person, place, time/date and situation Mood: Euthymic Affect: Congruent Thought Processes and Associations: Coherent Fund of Knowledge: Fair Thought Content: Suicidal ideation and Homicidal ideation were denied Insight: Fair Judgement: Fair  Diagnosis: Bipolar disorder type I manic per history for psychotic features. Insomnia NOS. Adjustment disorder with anxiety . Treatment Plan:   Significant psychosocial stressors. Discussed for possible start therapy. Add Ativan 1 mg take half or one tablet only if needed for the first 20 days. Cautioned not to continue long-term and she has agreed to the plan. Continue other mood stabilizers the medications she has have refills on them.  Pertinent Labs and Relevant Prior Notes reviewed. Medication Side effects, benefits and risks reviewed/discussed with Patient. Time given for patient to respond and asks questions regarding the Diagnosis and Medications. Safety concerns and to report to ER if suicidal or call 911. Relevant Medications refilled or called in to pharmacy. Discussed weight maintenance and Sleep Hygiene. Follow up with Primary care provider in regards to Medical conditions. Recommend compliance with medications and follow up office appointments. Discussed to avail opportunity to consider or/and continue Individual therapy with Counselor. Greater than 50% of time was spend in counseling and coordination of care with the patient.  Schedule for Follow up visit in 1 months or call in earlier as necessary.  [name], MD 03/17/2014

## 2014-04-15 ENCOUNTER — Encounter: Payer: Self-pay | Admitting: Physician Assistant

## 2014-04-15 ENCOUNTER — Ambulatory Visit (INDEPENDENT_AMBULATORY_CARE_PROVIDER_SITE_OTHER): Payer: Medicare Other | Admitting: Physician Assistant

## 2014-04-15 VITALS — BP 121/62 | HR 109 | Ht 63.5 in

## 2014-04-15 DIAGNOSIS — M25519 Pain in unspecified shoulder: Secondary | ICD-10-CM

## 2014-04-15 DIAGNOSIS — M25512 Pain in left shoulder: Secondary | ICD-10-CM

## 2014-04-15 MED ORDER — HYDROCODONE-ACETAMINOPHEN 5-325 MG PO TABS: 1.0000 | ORAL_TABLET | ORAL | Status: DC | PRN

## 2014-04-15 MED ORDER — CYCLOBENZAPRINE HCL 10 MG PO TABS: 10.0000 mg | ORAL_TABLET | Freq: Three times a day (TID) | ORAL | Status: DC | PRN

## 2014-04-15 MED ORDER — MELOXICAM 15 MG PO TABS: 15.0000 mg | ORAL_TABLET | Freq: Every day | ORAL | Status: DC

## 2014-04-15 NOTE — Progress Notes (Signed)
   Subjective:    Patient ID: Amber Rice, female    DOB: 12/05/1955, 58 y.o.   MRN: [id number]  HPI Pt presents to the clinic with left shoulder pain and into upper back. Started one month ago more in the upper shoulder blade area. She has tried flexeril, heating pad, exercising and continues to get worse. No trauma. Pain is 10/10 at worst per pt. She often gets nauseated from pain. Hydrocodone helps the most but only for about 3 hours. Movement makes worse   Review of Systems  All other systems reviewed and are negative.      Objective:   Physical Exam  Constitutional: She appears well-developed and well-nourished.  Musculoskeletal:  Left shoulder pain over anterior and posterior shoulder where bursa would lie. ROM limited due to pain. Abduction to 70 degrees.  Hawkins negative.  Empty gain negative.  Strength 5/5 of upper extremities bilaterally.  Hand grip 5/5.           Assessment & Plan:  Left shoulder pain- bursitis vs adhesive  Rotator cuff seems stable. Pt declined xray today. If not improving will have done. Injection given today for symptomatic relief. Exercises given for bursitis. norco refilled. Continue flexeril. Start mobic daily for next 2 weeks.  Ice shoulder. If not improving in 2 weeks follow up.   Shoulder Injection Procedure Note  Pre-operative Diagnosis: left shoulder pain  Post-operative Diagnosis: same  Indications: symptomatic relief bursitis vs adhesive capsulitis  Anesthesia: ethyl chloride   Procedure Details   Verbal consent was obtained for the procedure. The shoulder was prepped with iodine and the skin was anesthetized. Using a 22 gauge needle the glenohumeral joint is injected with 9 mL 1% lidocaine and 1 mL of depo medrol 40mg  under the posterior aspect of the acromion. The injection site was cleansed with topical isopropyl alcohol and a dressing was applied.  Complications:  None; patient tolerated the procedure well.

## 2014-04-17 ENCOUNTER — Encounter (HOSPITAL_COMMUNITY): Payer: Self-pay | Admitting: Psychiatry

## 2014-04-17 ENCOUNTER — Ambulatory Visit (INDEPENDENT_AMBULATORY_CARE_PROVIDER_SITE_OTHER): Payer: Medicare Other | Admitting: Psychiatry

## 2014-04-17 VITALS — BP 150/84 | HR 100 | Ht 65.0 in | Wt 200.0 lb

## 2014-04-17 DIAGNOSIS — F312 Bipolar disorder, current episode manic severe with psychotic features: Secondary | ICD-10-CM | POA: Diagnosis not present

## 2014-04-17 DIAGNOSIS — F4322 Adjustment disorder with anxiety: Secondary | ICD-10-CM | POA: Diagnosis not present

## 2014-04-17 DIAGNOSIS — F419 Anxiety disorder, unspecified: Secondary | ICD-10-CM

## 2014-04-17 DIAGNOSIS — G4700 Insomnia, unspecified: Secondary | ICD-10-CM

## 2014-04-17 MED ORDER — SERTRALINE HCL 100 MG PO TABS: 100.0000 mg | ORAL_TABLET | Freq: Every day | ORAL | Status: DC

## 2014-04-17 MED ORDER — LORAZEPAM 2 MG PO TABS: ORAL_TABLET | ORAL | Status: DC

## 2014-04-17 MED ORDER — LITHIUM CARBONATE 300 MG PO CAPS: 300.0000 mg | ORAL_CAPSULE | Freq: Every day | ORAL | Status: DC

## 2014-04-17 MED ORDER — HALOPERIDOL 2 MG PO TABS: 2.0000 mg | ORAL_TABLET | Freq: Every day | ORAL | Status: DC

## 2014-04-17 NOTE — Progress Notes (Signed)
Patient ID: Amber Rice, female   DOB: 07-20-1956, 58 y.o.   MRN: [id number] [place] [id number] 58 y.o.  04/17/2014  Chief Complaint: Depression and history of bipolar     History of Present Illness:   Patient returns for Medication Follow up and is diagnosed with bipolar disorder. Current episode possibly depressed.  Patient has been feeling lonely and depressed. Significant financial issues and she has to move into a smaller apartment. She does have a sister who she talks with but otherwise she misses her husband her husbands anniversary was in the month of April and May. She still misses him but does not endorse hopelessness to the suicide. She is having medical issues that she gets treated stress. She takes her pain medication only if needed. She's not drinking alcohol or using drugs. Today she is endorsing feeling somewhat better regarding anxiety.  She is very satisfied with the Ativan. She's not taking Ativan at night half a tablet of 2 mg. She feels taking Ativan and sleeping better has improved her depression and anxiety during the day. She still gets overwhelm when she talks about the move and finances but overall adjusting and restarting ativan has significantly helped her. Has been on ativan in past but was discontinued since then her anxiety has not been in control which effects then her sleep and mood.  Duration of bipolar more than for 5 years  Depression scale is 6/10. Improved from 4. Modyifiyng factors. meds and going out to walk. Aggravating factors: poor sleep and finances. medcial conditions and pain.   Lithium level is 0.4. TSH being managed by a medical provider.  She also has D. degenerative joint disease she is currently on pain medications takes when necessary. When she gets depressed she is withdrawn disturbed sleep energy appetite. She was in 2009 when she had a manic episode during which she was talking  to herself her mind was racing she was spending too much time in projects and them feeling hyper elevated.  There is no involuntary movements noticeable she is tolerating medication and no reported side effects. Past Medical History  Diagnosis Date  . Hyperlipidemia    Family History  Problem Relation Age of Onset  . Cancer Mother   . Heart attack Father   . Hyperlipidemia Father   . Cancer Maternal Grandmother   . Cancer Paternal Grandmother   . Bipolar disorder Paternal Uncle     Committed suicide in his 66's  . Dementia Paternal Uncle   . Depression Cousin   . Alcohol abuse Neg Hx   . Anxiety disorder Neg Hx   . Schizophrenia Cousin   . Multiple sclerosis Sister     Outpatient Encounter Prescriptions as of 04/17/2014  Medication Sig  . cyclobenzaprine (FLEXERIL) 10 MG tablet Take 1 tablet (10 mg total) by mouth 3 (three) times daily as needed for muscle spasms.  . haloperidol (HALDOL) 2 MG tablet Take 1 tablet (2 mg total) by mouth daily.  . Hydrocodone-Acetaminophen 5-300 MG TABS Take by mouth.  . levothyroxine (SYNTHROID, LEVOTHROID) 50 MCG tablet Take 1 tablet (50 mcg total) by mouth daily.  Marland Kitchen lithium carbonate 300 MG capsule Take 1 capsule (300 mg total) by mouth daily.  Marland Kitchen LORazepam (ATIVAN) 2 MG tablet Take half tablet a night for anxiety and sleep.  . meloxicam (MOBIC) 15 MG tablet Take 1 tablet (15 mg total) by mouth daily.  . sertraline (ZOLOFT) 100 MG tablet Take 1 tablet (  100 mg total) by mouth daily.  . [DISCONTINUED] haloperidol (HALDOL) 1 MG tablet Take 2 tablets (2 mg total) by mouth at bedtime.  . [DISCONTINUED] haloperidol (HALDOL) 2 MG tablet Take 2 mg by mouth.  . [DISCONTINUED] HYDROcodone-acetaminophen (NORCO/VICODIN) 5-325 MG per tablet Take 1 tablet by mouth as needed for moderate pain.  . [DISCONTINUED] lithium carbonate (LITHOBID) 300 MG CR tablet Take 2 tablets (600 mg total) by mouth at bedtime.  . [DISCONTINUED] lithium carbonate 300 MG capsule Take  300 mg by mouth.  . [DISCONTINUED] LORazepam (ATIVAN) 2 MG tablet Take one table for anxiety if needed a day  . [DISCONTINUED] sertraline (ZOLOFT) 100 MG tablet Take 2 tablets (200 mg total) by mouth at bedtime.  . [DISCONTINUED] sertraline (ZOLOFT) 100 MG tablet Take 100 mg by mouth.  . Pitavastatin Calcium (LIVALO) 4 MG TABS Take 1 tablet (4 mg total) by mouth daily.    Recent Results (from the past 2160 hour(s))  LITHIUM LEVEL     Status: Abnormal   Collection Time    02/20/14  4:22 PM      Result Value Ref Range   Lithium Lvl 0.40 (*) 0.80 - 1.40 mEq/L  COMPLETE METABOLIC PANEL WITH GFR     Status: Abnormal   Collection Time    02/20/14  4:22 PM      Result Value Ref Range   Sodium 134 (*) 135 - 145 mEq/L   Potassium 4.9  3.5 - 5.3 mEq/L   Chloride 105  96 - 112 mEq/L   CO2 22  19 - 32 mEq/L   Glucose, Bld 89  70 - 99 mg/dL   BUN 9  6 - 23 mg/dL   Creat 0.92  0.50 - 1.10 mg/dL   Total Bilirubin 0.4  0.2 - 1.2 mg/dL   Alkaline Phosphatase 88  39 - 117 U/L   AST 21  0 - 37 U/L   ALT 24  0 - 35 U/L   Total Protein 6.7  6.0 - 8.3 g/dL   Albumin 4.1  3.5 - 5.2 g/dL   Calcium 9.3  8.4 - 10.5 mg/dL   GFR, Est African American 80     GFR, Est Non African American 69     Comment:       The estimated GFR is a calculation valid for adults (>=34 years old)     that uses the CKD-EPI algorithm to adjust for age and sex. It is       not to be used for children, pregnant women, hospitalized patients,        patients on dialysis, or with rapidly changing kidney function.     According to the NKDEP, eGFR >89 is normal, 60-89 shows mild     impairment, 30-59 shows moderate impairment, 15-29 shows severe     impairment and <15 is ESRD.        TSH     Status: None   Collection Time    02/20/14  4:22 PM      Result Value Ref Range   TSH 4.134  0.350 - 4.500 uIU/mL  T4, FREE     Status: None   Collection Time    02/20/14  4:22 PM      Result Value Ref Range   Free T4 1.07  0.80 - 1.80  ng/dL  POC HEMOCCULT BLD/STL (HOME/3-CARD/SCREEN)     Status: None   Collection Time    02/23/14 11:32 AM  Result Value Ref Range   Card #1 Date 717/15     Fecal Occult Blood, POC Negative     Card #2 Date 02/21/14     Card #2 Fecal Occult Blod, POC Negative     Card #3 Date 02/22/14     Card #3 Fecal Occult Blood, POC Negative      BP 150/84  Pulse 100  Ht _0  (1.651 m)  Wt 200 lb (90.719 kg)  BMI 33.28 kg/m2   Review of Systems  Constitutional: Positive for malaise/fatigue.  Eyes: Negative.   Gastrointestinal: Negative.  Negative for nausea.  Musculoskeletal: Positive for myalgias.  Neurological: Negative.  Negative for tingling, sensory change and headaches.  Psychiatric/Behavioral: Positive for depression. Negative for suicidal ideas, hallucinations and substance abuse. The patient is nervous/anxious. The patient does not have insomnia.     Mental Status Examination  Appearance: casual Alert: Yes Attention: fair  Cooperative: Yes Eye Contact: Good Speech: normal Psychomotor Activity: Decreased Memory/Concentration: adequate Oriented: person, place, time/date and situation Mood: Euthymic Affect: Congruent Thought Processes and Associations: Coherent Fund of Knowledge: Fair Thought Content: Suicidal ideation and Homicidal ideation were denied Insight: Fair Judgement: Fair  Diagnosis: Bipolar disorder type I manic per history for psychotic features. Insomnia NOS. Adjustment disorder with anxiety . Treatment Plan:   Psychosocial and financial issues aggravate her stress level. Ativan helped her sleep and improved mood . Will continue 29m (half tablet at night only).  Continue lithium and zoloft.  Recommend therapy. Pertinent Labs and Relevant Prior Notes reviewed. Medication Side effects, benefits and risks reviewed/discussed with Patient. Time given for patient to respond and asks questions regarding the Diagnosis and Medications. Safety concerns and to  report to ER if suicidal or call 911. Relevant Medications refilled or called in to pharmacy. Discussed weight maintenance and Sleep Hygiene. Follow up with Primary care provider in regards to Medical conditions. Recommend compliance with medications and follow up office appointments. Discussed to avail opportunity to consider or/and continue Individual therapy with Counselor. Greater than 50% of time was spend in counseling and coordination of care with the patient.  Schedule for Follow up visit in 1 months or call in earlier as necessary.  AMerian Capron MD 04/17/2014

## 2014-04-29 ENCOUNTER — Ambulatory Visit (INDEPENDENT_AMBULATORY_CARE_PROVIDER_SITE_OTHER): Payer: Medicare Other

## 2014-04-29 ENCOUNTER — Encounter: Payer: Self-pay | Admitting: Physician Assistant

## 2014-04-29 ENCOUNTER — Ambulatory Visit (INDEPENDENT_AMBULATORY_CARE_PROVIDER_SITE_OTHER): Payer: Medicare Other | Admitting: Physician Assistant

## 2014-04-29 VITALS — BP 120/72 | HR 114 | Ht 63.5 in | Wt 204.0 lb

## 2014-04-29 DIAGNOSIS — M25519 Pain in unspecified shoulder: Secondary | ICD-10-CM | POA: Diagnosis not present

## 2014-04-29 DIAGNOSIS — M25512 Pain in left shoulder: Secondary | ICD-10-CM

## 2014-04-29 MED ORDER — PREDNISONE 50 MG PO TABS: ORAL_TABLET | ORAL | Status: DC

## 2014-04-29 MED ORDER — HYDROCODONE-ACETAMINOPHEN 5-325 MG PO TABS: 1.0000 | ORAL_TABLET | Freq: Three times a day (TID) | ORAL | Status: DC | PRN

## 2014-04-29 NOTE — Progress Notes (Signed)
   Subjective:    Patient ID: Amber Rice, female    DOB: 11/15/1955, 58 y.o.   MRN: [id number]  HPI Pt continues to have left shoulder pain. Pain is still 10/10. ROM decreased. Taking norco twice daily for pain. Takes mobic every morning. Not doing exercise due to pain. Icing but no relief. Movement makes worse. Steroid injection did not help at all.    Review of Systems  All other systems reviewed and are negative.      Objective:   Physical Exam  Musculoskeletal:  Left shoulder: ROM limited due to pain abduction 90 degrees.  Left arm strength decreased 4/5.  Pain over anterior and posterior shoulder and into scapular area to palpation. Left upper cervical tenderness and tightness.  Hand grip 5/5.  Pain with hawkins test.            Assessment & Plan:  Left shoulder pain- bursitis vs adhesive capsulitis. Did not get any improvement with injection. ROM still very decrease. Continue mobic. Trial of burst of prednisone. Refilled norco. Certainly do think there is some muscular involvement but flexeril is not helping. Follow up with Dr. Darene Rice ASAP. Pt reports not able to do exercise given due to pain.

## 2014-04-30 ENCOUNTER — Encounter: Payer: Self-pay | Admitting: Sports Medicine

## 2014-04-30 ENCOUNTER — Ambulatory Visit (INDEPENDENT_AMBULATORY_CARE_PROVIDER_SITE_OTHER): Payer: Medicare Other | Admitting: Sports Medicine

## 2014-04-30 VITALS — BP 131/83 | HR 107 | Ht 65.0 in | Wt 206.0 lb

## 2014-04-30 DIAGNOSIS — M75100 Unspecified rotator cuff tear or rupture of unspecified shoulder, not specified as traumatic: Secondary | ICD-10-CM

## 2014-04-30 DIAGNOSIS — M25512 Pain in left shoulder: Secondary | ICD-10-CM

## 2014-04-30 DIAGNOSIS — IMO0002 Reserved for concepts with insufficient information to code with codable children: Secondary | ICD-10-CM

## 2014-04-30 DIAGNOSIS — M19012 Primary osteoarthritis, left shoulder: Secondary | ICD-10-CM | POA: Insufficient documentation

## 2014-04-30 NOTE — Assessment & Plan Note (Signed)
Clinically represents a subacromial bursitis. Subacromial injection as above. Continue rehabilitation exercises, return in 4 weeks.

## 2014-04-30 NOTE — Progress Notes (Signed)
   Subjective:    I'm seeing this patient as a consultation for:  Iran Planas, PA-C  CC: Left shoulder pain  HPI: This is a very pleasant 58 year old female with a several week history, 6 weeks, the pain on the lateral left shoulder worse with overhead activities, trauma, or constitutional symptoms. She has done a few weeks of conservative measures. Pain is moderate, persistent.  Past medical history, Surgical history, Family history not pertinant except as noted below, Social history, Allergies, and medications have been entered into the medical record, reviewed, and no changes needed.   Review of Systems: No headache, visual changes, nausea, vomiting, diarrhea, constipation, dizziness, abdominal pain, skin rash, fevers, chills, night sweats, weight loss, swollen lymph nodes, body aches, joint swelling, muscle aches, chest pain, shortness of breath, mood changes, visual or auditory hallucinations.   Objective:   General: Well Developed, well nourished, and in no acute distress.  Neuro/Psych: Alert and oriented x3, extra-ocular muscles intact, able to move all 4 extremities, sensation grossly intact. Skin: Warm and dry, no rashes noted.  Respiratory: Not using accessory muscles, speaking in full sentences, trachea midline.  Cardiovascular: Pulses palpable, no extremity edema. Abdomen: Does not appear distended. Left Shoulder: Inspection reveals no abnormalities, atrophy or asymmetry. Palpation is normal with no tenderness over AC joint or bicipital groove. ROM is full in all planes. Rotator cuff strength normal throughout. Positive Neer and Hawkin's tests, empty can. Speeds and Yergason's tests normal. No labral pathology noted with negative Obrien's, negative crank, negative clunk, and good stability. Normal scapular function observed. No painful arc and no drop arm sign. No apprehension sign  Procedure: Real-time Ultrasound Guided Injection of left subacromial bursa Device: GE  Logiq E  Verbal informed consent obtained.  Time-out conducted.  Noted no overlying erythema, induration, or other signs of local infection.  Skin prepped in a sterile fashion.  Local anesthesia: Topical Ethyl chloride.  With sterile technique and under real time ultrasound guidance:  1 cc Kenalog 40, 4 cc lidocaine injected easily. Completed without difficulty  Pain immediately resolved suggesting accurate placement of the medication.  Advised to call if fevers/chills, erythema, induration, drainage, or persistent bleeding.  Images permanently stored and available for review in the ultrasound unit.  Impression: Technically successful ultrasound guided injection.  Impression and Recommendations:   This case required medical decision making of moderate complexity.

## 2014-05-19 ENCOUNTER — Telehealth (HOSPITAL_COMMUNITY): Payer: Self-pay | Admitting: Psychiatry

## 2014-05-19 MED ORDER — SERTRALINE HCL 100 MG PO TABS: 100.0000 mg | ORAL_TABLET | Freq: Every day | ORAL | Status: DC

## 2014-05-19 NOTE — Telephone Encounter (Signed)
zoloft prescription sent to pharmacy

## 2014-05-25 ENCOUNTER — Ambulatory Visit: Payer: Self-pay | Admitting: Physician Assistant

## 2014-05-28 ENCOUNTER — Ambulatory Visit (INDEPENDENT_AMBULATORY_CARE_PROVIDER_SITE_OTHER): Payer: Medicare Other | Admitting: Sports Medicine

## 2014-05-28 ENCOUNTER — Encounter: Payer: Self-pay | Admitting: Sports Medicine

## 2014-05-28 VITALS — BP 134/80 | HR 115 | Ht 65.0 in | Wt 203.0 lb

## 2014-05-28 DIAGNOSIS — M25512 Pain in left shoulder: Secondary | ICD-10-CM | POA: Diagnosis not present

## 2014-05-28 NOTE — Progress Notes (Signed)
  Subjective:    CC: Followup  HPI: Left shoulder pain: Improved significantly after subacromial injection, pain is now referable to the glenohumeral joint and she desires repeat interventional treatment. Pain is moderate, persistent without radiation.  Past medical history, Surgical history, Family history not pertinant except as noted below, Social history, Allergies, and medications have been entered into the medical record, reviewed, and no changes needed.   Review of Systems: No fevers, chills, night sweats, weight loss, chest pain, or shortness of breath.   Objective:    General: Well Developed, well nourished, and in no acute distress.  Neuro: Alert and oriented x3, extra-ocular muscles intact, sensation grossly intact.  HEENT: Normocephalic, atraumatic, pupils equal round reactive to light, neck supple, no masses, no lymphadenopathy, thyroid nonpalpable.  Skin: Warm and dry, no rashes. Cardiac: Regular rate and rhythm, no murmurs rubs or gallops, no lower extremity edema.  Respiratory: Clear to auscultation bilaterally. Not using accessory muscles, speaking in full sentences. Left Shoulder: Inspection reveals no abnormalities, atrophy or asymmetry. Palpation is normal with no tenderness over AC joint or bicipital groove. ROM is full in all planes. Rotator cuff strength normal throughout. No signs of impingement with negative Neer and Hawkin's tests, empty can. Speeds and Yergason's tests normal. Positive crank test. Normal scapular function observed. No painful arc and no drop arm sign. No apprehension sign.  Procedure: Real-time Ultrasound Guided Injection of left glenohumeral joint Device: GE Logiq E  Verbal informed consent obtained.  Time-out conducted.  Noted no overlying erythema, induration, or other signs of local infection.  Skin prepped in a sterile fashion.  Local anesthesia: Topical Ethyl chloride.  With sterile technique and under real time ultrasound  guidance:  Spinal needle advanced into joint taking care to avoid the labrum, 1 cc kenalog 40, 4 cc lidocaine injected easily. Completed without difficulty  Pain immediately resolved suggesting accurate placement of the medication.  Advised to call if fevers/chills, erythema, induration, drainage, or persistent bleeding.  Images permanently stored and available for review in the ultrasound unit.  Impression: Technically successful ultrasound guided injection.  Impression and Recommendations:

## 2014-05-28 NOTE — Assessment & Plan Note (Signed)
Impingement type symptoms have resolved after injection. Pain today is now referable to the glenohumeral joint. Glenohumeral joint injection today as above. Return in one month.

## 2014-06-08 ENCOUNTER — Encounter (HOSPITAL_COMMUNITY): Payer: Self-pay | Admitting: Psychiatry

## 2014-06-08 ENCOUNTER — Ambulatory Visit (INDEPENDENT_AMBULATORY_CARE_PROVIDER_SITE_OTHER): Payer: Medicare Other | Admitting: Psychiatry

## 2014-06-08 VITALS — BP 126/85 | HR 90 | Ht 65.0 in | Wt 200.0 lb

## 2014-06-08 DIAGNOSIS — F4322 Adjustment disorder with anxiety: Secondary | ICD-10-CM | POA: Diagnosis not present

## 2014-06-08 DIAGNOSIS — G4700 Insomnia, unspecified: Secondary | ICD-10-CM | POA: Diagnosis not present

## 2014-06-08 DIAGNOSIS — F419 Anxiety disorder, unspecified: Secondary | ICD-10-CM

## 2014-06-08 DIAGNOSIS — F312 Bipolar disorder, current episode manic severe with psychotic features: Secondary | ICD-10-CM | POA: Diagnosis not present

## 2014-06-08 MED ORDER — LORAZEPAM 2 MG PO TABS: ORAL_TABLET | ORAL | Status: DC

## 2014-06-08 MED ORDER — HALOPERIDOL 2 MG PO TABS: 2.0000 mg | ORAL_TABLET | Freq: Every day | ORAL | Status: DC

## 2014-06-08 MED ORDER — SERTRALINE HCL 100 MG PO TABS: 200.0000 mg | ORAL_TABLET | Freq: Every day | ORAL | Status: DC

## 2014-06-08 MED ORDER — LITHIUM CARBONATE 300 MG PO CAPS
600.0000 mg | ORAL_CAPSULE | Freq: Every day | ORAL | Status: DC
Start: 2014-06-08 — End: 2014-06-23

## 2014-06-08 NOTE — Progress Notes (Signed)
Patient ID: Amber Rice, female   DOB: 1956/05/13, 58 y.o.   MRN: [id number] [place] [id number] 58 y.o.  06/08/2014  Chief Complaint: Depression and history of bipolar     History of Present Illness:   Patient returns for Medication Follow up and is diagnosed with bipolar disorder. Current episode possibly depressed.  Patient has been feeling lonely and last visit had concern of moving. She had moved and adjusting. Recently hurt her knee. Ongoing financial issues and stress of being lonely. She does have a sister who she talks with but otherwise she misses her husband her husbands anniversary was in the month of April and May. She still misses him but does not endorse hopelessness to the suicide. She is having medical issues that she gets treated stress. She takes her pain medication only if needed. She's not drinking alcohol or using drugs. Today does not endorse any worsening of depression or side effects.  She is very satisfied with the Ativan. She's not taking Ativan at night half a tablet of 2 mg. She feels taking Ativan and sleeping better has improved her depression and anxiety during the day. She still gets overwhelm when she talks about the move and finances but overall adjusting and restarting ativan has significantly helped her. Has been on ativan in past but was discontinued since then her anxiety has not been in control which effects then her sleep and mood.  Duration of bipolar more than for 5 years  Depression scale is 6/10. Improved from 4. Modyifiyng factors. meds and going out to walk. Aggravating factors: poor sleep and finances. medcial conditions and pain.   Lithium level is 0.4. TSH being managed by a medical provider.  She also has D. degenerative joint disease she is currently on pain medications takes when necessary. When she gets depressed she is withdrawn disturbed sleep energy appetite. She was in 2009  when she had a manic episode during which she was talking to herself her mind was racing she was spending too much time in projects and them feeling hyper elevated.  There is no involuntary movements noticeable she is tolerating medication and no reported side effects. Past Medical History  Diagnosis Date  . Hyperlipidemia    Family History  Problem Relation Age of Onset  . Cancer Mother   . Heart attack Father   . Hyperlipidemia Father   . Cancer Maternal Grandmother   . Cancer Paternal Grandmother   . Bipolar disorder Paternal Uncle     Committed suicide in his 23's  . Dementia Paternal Uncle   . Depression Cousin   . Alcohol abuse Neg Hx   . Anxiety disorder Neg Hx   . Schizophrenia Cousin   . Multiple sclerosis Sister     Outpatient Encounter Prescriptions as of 06/08/2014  Medication Sig  . cyclobenzaprine (FLEXERIL) 10 MG tablet Take 1 tablet (10 mg total) by mouth 3 (three) times daily as needed for muscle spasms.  . haloperidol (HALDOL) 2 MG tablet Take 1 tablet (2 mg total) by mouth daily.  Marland Kitchen HYDROcodone-acetaminophen (NORCO/VICODIN) 5-325 MG per tablet Take 1 tablet by mouth every 8 (eight) hours as needed for moderate pain.  Marland Kitchen levothyroxine (SYNTHROID, LEVOTHROID) 50 MCG tablet Take 1 tablet (50 mcg total) by mouth daily.  Marland Kitchen lithium carbonate 300 MG capsule Take 2 capsules (600 mg total) by mouth daily.  Marland Kitchen LORazepam (ATIVAN) 2 MG tablet Take half tablet a night for anxiety and sleep.  Marland Kitchen  meloxicam (MOBIC) 15 MG tablet Take 1 tablet (15 mg total) by mouth daily.  . Pitavastatin Calcium (LIVALO) 4 MG TABS Take 1 tablet (4 mg total) by mouth daily.  . sertraline (ZOLOFT) 100 MG tablet Take 2 tablets (200 mg total) by mouth daily.  . [DISCONTINUED] haloperidol (HALDOL) 2 MG tablet Take 1 tablet (2 mg total) by mouth daily.  . [DISCONTINUED] lithium carbonate 300 MG capsule Take 1 capsule (300 mg total) by mouth daily.  . [DISCONTINUED] LORazepam (ATIVAN) 2 MG tablet Take  half tablet a night for anxiety and sleep.  . [DISCONTINUED] sertraline (ZOLOFT) 100 MG tablet Take 1 tablet (100 mg total) by mouth daily.    No results found for this or any previous visit (from the past 2160 hour(s)).  BP 126/85 mmHg  Pulse 90  Ht 5\' 5"  (1.651 m)  Wt 200 lb (90.719 kg)  BMI 33.28 kg/m2   Review of Systems  Constitutional: Positive for malaise/fatigue. Negative for fever.  Eyes: Negative.   Respiratory: Negative for cough.   Cardiovascular: Negative for chest pain.  Gastrointestinal: Negative.  Negative for nausea.  Musculoskeletal: Positive for myalgias.  Neurological: Negative.  Negative for tingling, tremors and headaches.  Psychiatric/Behavioral: Positive for depression. Negative for suicidal ideas, hallucinations and substance abuse. The patient is nervous/anxious. The patient does not have insomnia.     Mental Status Examination  Appearance: casual Alert: Yes Attention: fair  Cooperative: Yes Eye Contact: Good Speech: normal Psychomotor Activity: Decreased Memory/Concentration: adequate Oriented: person, place, time/date and situation Mood: Euthymic Affect: Congruent Thought Processes and Associations: Coherent Fund of Knowledge: Fair Thought Content: Suicidal ideation and Homicidal ideation were denied Insight: Fair Judgement: Fair  Diagnosis: Bipolar disorder type I manic per history for psychotic features. Insomnia NOS. Adjustment disorder with anxiety . Treatment Plan:   Continue Zoloft 200 mg. Lithium 600 mg. Haldol 2 mg at night. There is no involuntary movements noticeable.  Recommend therapy for her stressors including being lonely and she gets involved in negative thoughts that enhances her depression at times. Pertinent Labs and Relevant Prior Notes reviewed. Medication Side effects, benefits and risks reviewed/discussed with Patient. Time given for patient to respond and asks questions regarding the Diagnosis and  Medications. Safety concerns and to report to ER if suicidal or call 911. Relevant Medications refilled or called in to pharmacy. Discussed weight maintenance and Sleep Hygiene. Follow up with Primary care provider in regards to Medical conditions. Recommend compliance with medications and follow up office appointments. Discussed to avail opportunity to consider or/and continue Individual therapy with Counselor. Greater than 50% of time was spend in counseling and coordination of care with the patient.  Schedule for Follow up visit in 2  months or call in earlier as necessary.  [name], MD 06/08/2014

## 2014-06-10 ENCOUNTER — Other Ambulatory Visit: Payer: Self-pay | Admitting: Physician Assistant

## 2014-06-18 ENCOUNTER — Ambulatory Visit: Payer: Self-pay | Admitting: Sports Medicine

## 2014-06-19 ENCOUNTER — Ambulatory Visit (HOSPITAL_COMMUNITY): Payer: Self-pay | Admitting: Psychiatry

## 2014-06-22 ENCOUNTER — Telehealth (HOSPITAL_COMMUNITY): Payer: Self-pay | Admitting: Psychiatry

## 2014-06-23 MED ORDER — LITHIUM CARBONATE 300 MG PO CAPS: 600.0000 mg | ORAL_CAPSULE | Freq: Every day | ORAL | Status: DC

## 2014-06-23 NOTE — Telephone Encounter (Signed)
Lithium refill sent. 300mg  two tablets a day.

## 2014-06-23 NOTE — Telephone Encounter (Signed)
PT needs a refill for Lithium for 60 tablets. She needs it today.

## 2014-06-30 ENCOUNTER — Other Ambulatory Visit: Payer: Self-pay | Admitting: *Deleted

## 2014-06-30 MED ORDER — HYDROCODONE-ACETAMINOPHEN 5-325 MG PO TABS: 1.0000 | ORAL_TABLET | Freq: Three times a day (TID) | ORAL | Status: DC | PRN

## 2014-08-09 ENCOUNTER — Other Ambulatory Visit (HOSPITAL_COMMUNITY): Payer: Self-pay | Admitting: Psychiatry

## 2014-08-11 ENCOUNTER — Encounter (INDEPENDENT_AMBULATORY_CARE_PROVIDER_SITE_OTHER): Payer: Self-pay

## 2014-08-11 ENCOUNTER — Encounter (HOSPITAL_COMMUNITY): Payer: Self-pay | Admitting: Psychiatry

## 2014-08-11 ENCOUNTER — Ambulatory Visit (INDEPENDENT_AMBULATORY_CARE_PROVIDER_SITE_OTHER): Payer: Medicare Other | Admitting: Psychiatry

## 2014-08-11 VITALS — BP 126/81 | HR 107 | Ht 65.0 in | Wt 200.0 lb

## 2014-08-11 DIAGNOSIS — F419 Anxiety disorder, unspecified: Secondary | ICD-10-CM

## 2014-08-11 DIAGNOSIS — F4322 Adjustment disorder with anxiety: Secondary | ICD-10-CM

## 2014-08-11 DIAGNOSIS — F312 Bipolar disorder, current episode manic severe with psychotic features: Secondary | ICD-10-CM | POA: Diagnosis not present

## 2014-08-11 DIAGNOSIS — G4700 Insomnia, unspecified: Secondary | ICD-10-CM

## 2014-08-11 MED ORDER — SERTRALINE HCL 100 MG PO TABS: 200.0000 mg | ORAL_TABLET | Freq: Every day | ORAL | Status: DC

## 2014-08-11 MED ORDER — LORAZEPAM 2 MG PO TABS: ORAL_TABLET | ORAL | Status: DC

## 2014-08-11 MED ORDER — LITHIUM CARBONATE ER 300 MG PO TBCR: 300.0000 mg | EXTENDED_RELEASE_TABLET | Freq: Two times a day (BID) | ORAL | Status: DC

## 2014-08-11 MED ORDER — HALOPERIDOL 2 MG PO TABS: 2.0000 mg | ORAL_TABLET | Freq: Every day | ORAL | Status: DC

## 2014-08-11 NOTE — Progress Notes (Signed)
Patient ID: Amber Rice, female   DOB: 1956-03-28, 59 y.o.   MRN: [id number] [place] [id number] 59 y.o.  08/11/2014  Chief Complaint: Depression and history of bipolar     History of Present Illness:   Patient returns for Medication Follow up and is diagnosed with bipolar disorder. Current episode possibly depressed.  Patient has been feeling lonely and last visit had concern of moving. She had moved and adjusting. Recently hurt her knee. Ongoing financial issues and stress of being lonely. She does have a sister who she talks with but otherwise she misses her husband her husbands anniversary was in the month of April and May. She still misses him but does not endorse hopelessness to the suicide. She is having medical issues that she gets treated stress. She takes her pain medication only if needed. She's not drinking alcohol or using drugs. Today does not endorse any worsening of depression or side effects. Her move has gone fine. Ativan at night helps her sleep. She worries about finances. Her anniversary is dec 28 and she lost her husband few years ago in December. So December has been a rough month.  She feels taking Ativan and sleeping better has improved her depression and anxiety during the day. She still gets overwhelm when she talks about the move and finances but overall adjusting and restarting ativan has significantly helped her. Has been on ativan in past but was discontinued since then her anxiety has not been in control which effects then her sleep and mood. Duration of bipolar more than for 5 years  Depression scale is 6/10. Improved from 4. Modyifiyng factors. meds and going out to walk. Aggravating factors: poor sleep and finances. medcial conditions and pain.   Lithium level is 0.4. TSH being managed by a medical provider.  She also has D. degenerative joint disease she is currently on pain medications takes when  necessary. When she gets depressed she is withdrawn disturbed sleep energy appetite. She was in 2009 when she had a manic episode during which she was talking to herself her mind was racing she was spending too much time in projects and them feeling hyper elevated.  There is no involuntary movements noticeable she is tolerating medication and no reported side effects. Past Medical History  Diagnosis Date  . Hyperlipidemia    Family History  Problem Relation Age of Onset  . Cancer Mother   . Heart attack Father   . Hyperlipidemia Father   . Cancer Maternal Grandmother   . Cancer Paternal Grandmother   . Bipolar disorder Paternal Uncle     Committed suicide in his 62's  . Dementia Paternal Uncle   . Depression Cousin   . Alcohol abuse Neg Hx   . Anxiety disorder Neg Hx   . Schizophrenia Cousin   . Multiple sclerosis Sister     Outpatient Encounter Prescriptions as of 08/11/2014  Medication Sig  . cyclobenzaprine (FLEXERIL) 10 MG tablet TAKE 1 TABLET (10 MG TOTAL) BY MOUTH 3 (THREE) TIMES DAILY AS NEEDED FOR MUSCLE SPASMS.  . haloperidol (HALDOL) 2 MG tablet Take 1 tablet (2 mg total) by mouth daily.  Marland Kitchen HYDROcodone-acetaminophen (NORCO/VICODIN) 5-325 MG per tablet Take 1 tablet by mouth every 8 (eight) hours as needed for moderate pain.  Marland Kitchen levothyroxine (SYNTHROID, LEVOTHROID) 50 MCG tablet Take 1 tablet (50 mcg total) by mouth daily.  Marland Kitchen lithium carbonate (LITHOBID) 300 MG CR tablet Take 1 tablet (300 mg total) by  mouth 2 (two) times daily.  Marland Kitchen LORazepam (ATIVAN) 2 MG tablet Take half tablet a night for anxiety and sleep.  Marland Kitchen sertraline (ZOLOFT) 100 MG tablet Take 2 tablets (200 mg total) by mouth daily.  . [DISCONTINUED] haloperidol (HALDOL) 2 MG tablet Take 1 tablet (2 mg total) by mouth daily.  . [DISCONTINUED] lithium carbonate (LITHOBID) 300 MG CR tablet Take 1 tablet (300 mg total) by mouth 2 (two) times daily.  . [DISCONTINUED] LORazepam (ATIVAN) 2 MG tablet Take half tablet a  night for anxiety and sleep.  . [DISCONTINUED] sertraline (ZOLOFT) 100 MG tablet Take 2 tablets (200 mg total) by mouth daily.  . [DISCONTINUED] lithium carbonate 300 MG capsule Take 2 capsules (600 mg total) by mouth daily. (Patient not taking: Reported on 08/11/2014)  . [DISCONTINUED] meloxicam (MOBIC) 15 MG tablet Take 1 tablet (15 mg total) by mouth daily. (Patient not taking: Reported on 08/11/2014)  . [DISCONTINUED] Pitavastatin Calcium (LIVALO) 4 MG TABS Take 1 tablet (4 mg total) by mouth daily. (Patient not taking: Reported on 08/11/2014)    No results found for this or any previous visit (from the past 2160 hour(s)).  BP 126/81 mmHg  Pulse 107  Ht 5\' 5"  (1.651 m)  Wt 200 lb (90.719 kg)  BMI 33.28 kg/m2   Review of Systems  Constitutional: Positive for malaise/fatigue. Negative for chills.  Eyes: Negative.   Respiratory: Negative for cough.   Cardiovascular: Negative for palpitations.  Gastrointestinal: Negative for nausea.  Musculoskeletal: Positive for myalgias.  Neurological: Negative.  Negative for tingling, tremors and headaches.  Psychiatric/Behavioral: Positive for depression. Negative for suicidal ideas, hallucinations and substance abuse. The patient is nervous/anxious. The patient does not have insomnia.     Mental Status Examination  Appearance: casual Alert: Yes Attention: fair  Cooperative: Yes Eye Contact: Good Speech: normal Psychomotor Activity: Decreased Memory/Concentration: adequate Oriented: person, place, time/date and situation Mood: Euthymic Affect: Congruent Thought Processes and Associations: Coherent Fund of Knowledge: Fair Thought Content: Suicidal ideation and Homicidal ideation were denied Insight: Fair Judgement: Fair  Diagnosis: Bipolar disorder type I manic per history for psychotic features. Insomnia NOS. Adjustment disorder with anxiety . Treatment Plan:   Continue Zoloft 200 mg. Lithium 600 mg. Haldol 2 mg at night. There is no  involuntary movements noticeable. Ativan 2mg  (half tablet at night) 30 tablets last for 2 months.  No involuntary movements.   Recommend therapy for her stressors including being lonely and she gets involved in negative thoughts that enhances her depression at times. Pertinent Labs and Relevant Prior Notes reviewed. Medication Side effects, benefits and risks reviewed/discussed with Patient. Time given for patient to respond and asks questions regarding the Diagnosis and Medications. Safety concerns and to report to ER if suicidal or call 911. Relevant Medications refilled or called in to pharmacy. Discussed weight maintenance and Sleep Hygiene. Follow up with Primary care provider in regards to Medical conditions. Recommend compliance with medications and follow up office appointments. Discussed to avail opportunity to consider or/and continue Individual therapy with Counselor. She will make appointment.  Greater than 50% of time was spend in counseling and coordination of care with the patient.  Schedule for Follow up visit in 2  months or call in earlier as necessary.  [name], MD 08/11/2014

## 2014-08-12 ENCOUNTER — Encounter (HOSPITAL_COMMUNITY): Payer: Self-pay | Admitting: Licensed Clinical Social Worker

## 2014-08-12 ENCOUNTER — Other Ambulatory Visit (HOSPITAL_COMMUNITY): Payer: Self-pay | Admitting: Psychiatry

## 2014-08-12 ENCOUNTER — Ambulatory Visit (INDEPENDENT_AMBULATORY_CARE_PROVIDER_SITE_OTHER): Payer: Medicare Other | Admitting: Licensed Clinical Social Worker

## 2014-08-12 DIAGNOSIS — F3132 Bipolar disorder, current episode depressed, moderate: Secondary | ICD-10-CM | POA: Diagnosis not present

## 2014-08-12 DIAGNOSIS — F3130 Bipolar disorder, current episode depressed, mild or moderate severity, unspecified: Secondary | ICD-10-CM

## 2014-08-12 NOTE — Progress Notes (Signed)
Patient:   Amber Rice   DOB:   [birth year]  MR Number:  [id number]  Location:  [place] [address] Dept: [phone]           Date of Service:   08/12/13  Start Time:   1:05pm End Time:   2:00pm  Provider/Observer:  [facility]       Billing Code/Service: [phone]  Comprehensive Clinical Assessment  Information for assessment provided by: patient   Chief Complaint:    depression     Presenting Problem/Symptoms:  Depression and anxiety  "I'm bipolar."        Behavioral Observation: Amber Rice  presents as a 59 y.o.-year-old  Caucasian Female who appeared her stated age. her dress was Appropriate and she was Casual and her manners were Appropriate to the situation.   she displayed an appropriate level of cooperation and motivation.      Mental Health Symptoms:    Depression:    Current symptoms include depressed mood, anhedonia, insomnia, hypersomnia, psychomotor agitation, fatigue, feelings of worthlessness/guilt, difficulty concentrating,.    Sleep varies a lot   I can't watch a movie.  Attention span lasts only about 5 minutes.   Family history positive for depression in the patient's paternal uncle and paternal cousins.  Past episodes of depression: yes Had a severe episode in 2009.  Spent several months in bed.  Lost 40 lbs.  Also had psychotic symptoms.  Hospitalized at that time.    Remembers having issues with depression as a teenager.  Anxiety: not excessive, worries about finances mostly  Panic Attacks: Absent   Self-Harm Potential: Thoughts of Self-Harm: vague current thoughts Method: no plan Availability of means: na Is there a family history of suicide? Yes, uncle Previous attempts? no Preoccupation with death? no History of acts of self-harm? no  Dangerousness to Others Potential: Denies Family  history of violence? Grew up in a tough neighborhood in [place], but no violence in her home  Previous attempts? no   Mania/hypomania: history of elevated euphoric mood, excessive irritability, increased energy and activity present most of the day, overconfidence, decreased need for sleep, more talkative than usual, racing thoughts or flight of ideas, easily distracted, increased in goal-directed activity, buying sprees, increased sex drive  Remembers having a manic episode in 2013.  Doctor had taken her off of psych meds.  "I was spending money left and right."   Psychosis: History of delusions and hallucinations.  Last experienced in 2011.      Abuse/Trauma History: denies   PTSD symptoms: na         Mental Status  Interactions:    Active   Attention:   Good  Memory:   Intact  Speech:   Normal  Flow of Thought:   circumstantial  Thought Content:  WNL  Orientation:   person, place and time/date  Judgment:   Good  "As long as I'm on my meds"  Affect/Mood:   Appropriate  Insight:   Good  Intelligence:   normal      Medical History:    Past Medical History  Diagnosis Date  . Hyperlipidemia    Degenerative disc disease  Current medications: Name, dosage, regimen, # of months, taking as prescribed?        Outpatient Encounter Prescriptions as of 08/12/2014  Medication Sig  . cyclobenzaprine (FLEXERIL) 10 MG tablet TAKE 1 TABLET (10  MG TOTAL) BY MOUTH 3 (THREE) TIMES DAILY AS NEEDED FOR MUSCLE SPASMS.  . haloperidol (HALDOL) 2 MG tablet Take 1 tablet (2 mg total) by mouth daily.  Marland Kitchen HYDROcodone-acetaminophen (NORCO/VICODIN) 5-325 MG per tablet Take 1 tablet by mouth every 8 (eight) hours as needed for moderate pain.  Marland Kitchen levothyroxine (SYNTHROID, LEVOTHROID) 50 MCG tablet Take 1 tablet (50 mcg total) by mouth daily.  Marland Kitchen lithium carbonate (LITHOBID) 300 MG CR tablet Take 1 tablet (300 mg total) by mouth 2 (two) times daily.  Marland Kitchen LORazepam (ATIVAN) 2 MG  tablet Take half tablet a night for anxiety and sleep.  Marland Kitchen sertraline (ZOLOFT) 100 MG tablet Take 2 tablets (200 mg total) by mouth daily.              Mental Health/Substance Use Treatment History:    Saw a therapist in [place] from 2010-2014.  Helpful  Started [facility] in 2009.        Family Med/Psych History:  Family History  Problem Relation Age of Onset  . Cancer Mother   . Heart attack Father   . Hyperlipidemia Father   . Cancer Maternal Grandmother   . Cancer Paternal Grandmother   . Bipolar disorder Paternal Uncle     Committed suicide in his 28's  . Dementia Paternal Uncle   . Depression Cousin   . Alcohol abuse Neg Hx   . Anxiety disorder Neg Hx   . Schizophrenia Cousin   . Multiple sclerosis Sister        Substance Use History:    Tobacco use for 27 years   Marital Status: Widowed  She was married for 20 years.  "My marriage was wonderful."    Lost her husband in April 2011.  He had a genetic disease, AS.  When he was diagnosed they told him he had 10 years to live.   Difficult over those 10 years to watch him get worse.  He used a wheelchair.  Was in intensive care the last 5 months of his life.    Lives with:  Self  [place]oved here in [place] fom [place].    Family Relationships:  Good relationship with sister who has a close by apartment.   3 brothers and another sister.  They live in [place].  Not particularly close She is the oldest child.   Mom died in 65.  Dad died 20 years later.  Was not able to have children.  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  Raised Catholic.  Doesn't go to church.  Hobbies:  Used to enjoy reading and watching TV, but now has problems concentrating  Strengths/Protective  Factors:  Organized, clean, patient, caring     Impression/DX:  Patient meets criteria for:  F31.32 Bipolar I Disorder current episode depressed, moderate    A PHQ-9 administered today indicates she is experiencing a moderately severe level of depressive symptoms.  Disposition/Plan:  Recommending individual therapy with a focus on CBT for depression.  Continue medication management with Dr. [name].

## 2014-08-15 ENCOUNTER — Other Ambulatory Visit (HOSPITAL_COMMUNITY): Payer: Self-pay | Admitting: Psychiatry

## 2014-09-07 ENCOUNTER — Encounter: Payer: Self-pay | Admitting: Sports Medicine

## 2014-09-07 ENCOUNTER — Ambulatory Visit (INDEPENDENT_AMBULATORY_CARE_PROVIDER_SITE_OTHER): Payer: Medicare Other | Admitting: Sports Medicine

## 2014-09-07 DIAGNOSIS — M25512 Pain in left shoulder: Secondary | ICD-10-CM

## 2014-09-07 MED ORDER — CYCLOBENZAPRINE HCL 10 MG PO TABS: ORAL_TABLET | ORAL | Status: DC

## 2014-09-07 MED ORDER — HYDROCODONE-ACETAMINOPHEN 5-325 MG PO TABS: 1.0000 | ORAL_TABLET | Freq: Three times a day (TID) | ORAL | Status: DC | PRN

## 2014-09-07 NOTE — Assessment & Plan Note (Signed)
Pain is again referable to the left glenohumeral joint, and she is over 3 months after her last injection. Left glenohumeral injection as above, formal physical therapy in the hopes of spacing out the amount of time she needs between injections. Return in approximately a month.

## 2014-09-07 NOTE — Progress Notes (Signed)
  Subjective:    CC: Follow-up  HPI: Left shoulder pain: I have not seen Amber Rice in 3-1/2 months, she did extremely well after a left glenohumeral injection, and is desiring a repeat. Pain is mild, persistent. It is localized to the glenohumeral joint line.  Past medical history, Surgical history, Family history not pertinant except as noted below, Social history, Allergies, and medications have been entered into the medical record, reviewed, and no changes needed.   Review of Systems: No fevers, chills, night sweats, weight loss, chest pain, or shortness of breath.   Objective:    General: Well Developed, well nourished, and in no acute distress.  Neuro: Alert and oriented x3, extra-ocular muscles intact, sensation grossly intact.  HEENT: Normocephalic, atraumatic, pupils equal round reactive to light, neck supple, no masses, no lymphadenopathy, thyroid nonpalpable.  Skin: Warm and dry, no rashes. Cardiac: Regular rate and rhythm, no murmurs rubs or gallops, no lower extremity edema.  Respiratory: Clear to auscultation bilaterally. Not using accessory muscles, speaking in full sentences.  Procedure: Real-time Ultrasound Guided Injection of left glenohumeral joint Device: GE Logiq E  Verbal informed consent obtained.  Time-out conducted.  Noted no overlying erythema, induration, or other signs of local infection.  Skin prepped in a sterile fashion.  Local anesthesia: Topical Ethyl chloride.  With sterile technique and under real time ultrasound guidance:  Spinal needle advanced into the joint taking care to avoid the labrum, 1 mL kenalog 40, 4 mL lidocaine injected easily. Completed without difficulty  Pain immediately resolved suggesting accurate placement of the medication.  Advised to call if fevers/chills, erythema, induration, drainage, or persistent bleeding.  Images permanently stored and available for review in the ultrasound unit.  Impression: Technically successful  ultrasound guided injection.  Impression and Recommendations:

## 2014-09-15 ENCOUNTER — Ambulatory Visit (HOSPITAL_COMMUNITY): Payer: Self-pay | Admitting: Licensed Clinical Social Worker

## 2014-09-17 ENCOUNTER — Ambulatory Visit (INDEPENDENT_AMBULATORY_CARE_PROVIDER_SITE_OTHER): Payer: Medicare Other | Admitting: Physical Therapy

## 2014-09-17 DIAGNOSIS — M6281 Muscle weakness (generalized): Secondary | ICD-10-CM

## 2014-09-17 DIAGNOSIS — M25519 Pain in unspecified shoulder: Secondary | ICD-10-CM

## 2014-09-17 DIAGNOSIS — M25512 Pain in left shoulder: Secondary | ICD-10-CM | POA: Diagnosis present

## 2014-09-17 DIAGNOSIS — M25619 Stiffness of unspecified shoulder, not elsewhere classified: Secondary | ICD-10-CM | POA: Diagnosis not present

## 2014-10-11 ENCOUNTER — Other Ambulatory Visit (HOSPITAL_COMMUNITY): Payer: Self-pay | Admitting: Psychiatry

## 2014-10-13 ENCOUNTER — Other Ambulatory Visit (HOSPITAL_COMMUNITY): Payer: Self-pay | Admitting: *Deleted

## 2014-10-13 ENCOUNTER — Ambulatory Visit (HOSPITAL_COMMUNITY): Payer: Self-pay | Admitting: Psychiatry

## 2014-10-13 MED ORDER — HALOPERIDOL 2 MG PO TABS: 2.0000 mg | ORAL_TABLET | Freq: Every day | ORAL | Status: DC

## 2014-10-13 MED ORDER — LITHIUM CARBONATE ER 300 MG PO TBCR: 300.0000 mg | EXTENDED_RELEASE_TABLET | Freq: Two times a day (BID) | ORAL | Status: DC

## 2014-10-13 NOTE — Telephone Encounter (Signed)
Pt called for refills for Haldol 2mg  and Lithium 300mg . Per Dr. [name], pt is authorized for a refill for Haldol 2mg , qty 30 and Lithium 300mg , Qty 60. Pt has a f/u appt on 4/12.  Prescription was sent to [facility]. Pharmacy will notify pt of prescription status.

## 2014-10-14 ENCOUNTER — Ambulatory Visit (HOSPITAL_COMMUNITY): Payer: Self-pay | Admitting: Licensed Clinical Social Worker

## 2014-10-15 ENCOUNTER — Encounter: Payer: Self-pay | Admitting: Physical Therapy

## 2014-10-19 ENCOUNTER — Ambulatory Visit (INDEPENDENT_AMBULATORY_CARE_PROVIDER_SITE_OTHER): Payer: Medicare Other | Admitting: Sports Medicine

## 2014-10-19 ENCOUNTER — Encounter: Payer: Self-pay | Admitting: Sports Medicine

## 2014-10-19 VITALS — BP 131/79 | HR 90 | Ht 65.0 in | Wt 202.0 lb

## 2014-10-19 DIAGNOSIS — M25512 Pain in left shoulder: Secondary | ICD-10-CM | POA: Diagnosis not present

## 2014-10-19 NOTE — Assessment & Plan Note (Signed)
Unfortunately pain has been exacerbated by physical therapy. Repeat glenohumeral injection today. It is a bit early. We will not do any more injections for at least 4 months.

## 2014-10-19 NOTE — Progress Notes (Signed)
  Subjective:    CC: Recurrent pain  HPI: This pleasant 59 year old female returns, she has left-sided shoulder pain most likely due to glenohumeral osteoarthritis, she has done extremely well with occasional glenohumeral joint injections. 6 weeks ago we injected her left glenohumeral joint, unfortunately she had a recurrence of pain after doing physical therapy, and is requesting repeat intervention today. Pain is moderate, persistent, localized at the joint.  Past medical history, Surgical history, Family history not pertinant except as noted below, Social history, Allergies, and medications have been entered into the medical record, reviewed, and no changes needed.   Review of Systems: No fevers, chills, night sweats, weight loss, chest pain, or shortness of breath.   Objective:    General: Well Developed, well nourished, and in no acute distress.  Neuro: Alert and oriented x3, extra-ocular muscles intact, sensation grossly intact.  HEENT: Normocephalic, atraumatic, pupils equal round reactive to light, neck supple, no masses, no lymphadenopathy, thyroid nonpalpable.  Skin: Warm and dry, no rashes. Cardiac: Regular rate and rhythm, no murmurs rubs or gallops, no lower extremity edema.  Respiratory: Clear to auscultation bilaterally. Not using accessory muscles, speaking in full sentences.  Procedure: Real-time Ultrasound Guided Injection of left glenohumeral joint Device: GE Logiq E  Verbal informed consent obtained.  Time-out conducted.  Noted no overlying erythema, induration, or other signs of local infection.  Skin prepped in a sterile fashion.  Local anesthesia: Topical Ethyl chloride.  With sterile technique and under real time ultrasound guidance: Spinal needle advanced into joint taking care to avoid the labrum, 1 mL kenalog 40, 4 mL lidocaine injected easily.  Completed without difficulty  Pain immediately resolved suggesting accurate placement of the medication.  Advised to  call if fevers/chills, erythema, induration, drainage, or persistent bleeding.  Images permanently stored and available for review in the ultrasound unit.  Impression: Technically successful ultrasound guided injection.  Impression and Recommendations:

## 2014-10-22 ENCOUNTER — Encounter (INDEPENDENT_AMBULATORY_CARE_PROVIDER_SITE_OTHER): Payer: Self-pay

## 2014-10-22 ENCOUNTER — Ambulatory Visit (INDEPENDENT_AMBULATORY_CARE_PROVIDER_SITE_OTHER): Payer: Medicare Other | Admitting: Licensed Clinical Social Worker

## 2014-10-22 DIAGNOSIS — F3130 Bipolar disorder, current episode depressed, mild or moderate severity, unspecified: Secondary | ICD-10-CM | POA: Diagnosis not present

## 2014-10-22 NOTE — Psych (Signed)
   THERAPIST PROGRESS NOTE  Session Time: 9:10-10:00am  Participation Level: Active  Behavioral Response: DisheveledAlertDepressed  Type of Therapy: Individual Therapy  Treatment Goals addressed: depression  Interventions: DBT  Therapist Interventions:  Gathered information about significant events and changes in mood and functioning since last seen in early January.  Reviewed results of PHQ-9.    Suggested ways to gradually increase activity as a way to combat depression (visit sister once a week, volunteer). Introduced patient to concept of mindfulness.  Explained how it can be helpful during times when you get stuck on unhelpful thoughts.  Provided her with some reading material about mindfulness.        Summary:  PHQ-9 indicated that depressive symptoms are at the moderately severe level.  Symptoms she indicated as most severe were anhedonia, trouble sleeping, fatigue, and trouble concentrating.  Has been staying in bed a lot listening to music.  Admits to limiting activity because of chronic back pain.  Noted that this is typically a difficult time of year for her because it marks the anniversary of her husband's death.  Indicated she is not likely to pursue spending more time with her sister.  She said, "I don't want to bother her.  She has MS and is always so exhausted." In response to learning about mindfulness she said, "I think it sounds like a good way to get rid of some of the depressive symptoms that I have."    Suicidal/Homicidal: Admitted to occasional vague SI without intent.  Denied HI       Plan: Schedule therapy twice a month.    Diagnosis: Bipolar I, current episode depressed        Amber Rice 10/22/2014

## 2014-11-03 ENCOUNTER — Other Ambulatory Visit: Payer: Self-pay | Admitting: Physician Assistant

## 2014-11-04 ENCOUNTER — Other Ambulatory Visit: Payer: Self-pay | Admitting: *Deleted

## 2014-11-04 MED ORDER — HYDROCODONE-ACETAMINOPHEN 5-325 MG PO TABS: 1.0000 | ORAL_TABLET | Freq: Three times a day (TID) | ORAL | Status: DC | PRN

## 2014-11-04 MED ORDER — CYCLOBENZAPRINE HCL 10 MG PO TABS: ORAL_TABLET | ORAL | Status: DC

## 2014-11-08 ENCOUNTER — Other Ambulatory Visit (HOSPITAL_COMMUNITY): Payer: Self-pay | Admitting: Psychiatry

## 2014-11-09 ENCOUNTER — Other Ambulatory Visit (HOSPITAL_COMMUNITY): Payer: Self-pay | Admitting: Psychiatry

## 2014-11-11 ENCOUNTER — Telehealth (HOSPITAL_COMMUNITY): Payer: Self-pay | Admitting: *Deleted

## 2014-11-11 MED ORDER — HALOPERIDOL 2 MG PO TABS: 2.0000 mg | ORAL_TABLET | Freq: Every day | ORAL | Status: DC

## 2014-11-11 MED ORDER — LITHIUM CARBONATE ER 300 MG PO TBCR: 300.0000 mg | EXTENDED_RELEASE_TABLET | Freq: Two times a day (BID) | ORAL | Status: DC

## 2014-11-11 MED ORDER — SERTRALINE HCL 100 MG PO TABS: 200.0000 mg | ORAL_TABLET | Freq: Every day | ORAL | Status: DC

## 2014-11-11 NOTE — Telephone Encounter (Signed)
Received fax from [facility] for a refill for Haldol 2mg , Zoloft 100mg , and Lithium 300mg . Per Dr. [name], pt is authorized for a refill for Haldol 2mg , Qty 30, Zoloft 100mg , Qty 60 and Lithium 300mg , Qty 60. Prescriptions were sent to pharmacy. Pt has f/u appt on 4/12. Called and informed pt of prescription status.  Pt states and shows understanding.

## 2014-11-17 ENCOUNTER — Ambulatory Visit (INDEPENDENT_AMBULATORY_CARE_PROVIDER_SITE_OTHER): Payer: Medicare Other | Admitting: Psychiatry

## 2014-11-17 ENCOUNTER — Encounter (HOSPITAL_COMMUNITY): Payer: Self-pay | Admitting: Psychiatry

## 2014-11-17 VITALS — HR 82 | Ht 65.0 in | Wt 200.0 lb

## 2014-11-17 DIAGNOSIS — F3130 Bipolar disorder, current episode depressed, mild or moderate severity, unspecified: Secondary | ICD-10-CM | POA: Diagnosis not present

## 2014-11-17 DIAGNOSIS — F419 Anxiety disorder, unspecified: Secondary | ICD-10-CM | POA: Diagnosis not present

## 2014-11-17 DIAGNOSIS — G4700 Insomnia, unspecified: Secondary | ICD-10-CM

## 2014-11-17 DIAGNOSIS — F4322 Adjustment disorder with anxiety: Secondary | ICD-10-CM | POA: Diagnosis not present

## 2014-11-17 MED ORDER — LORAZEPAM 2 MG PO TABS
ORAL_TABLET | ORAL | Status: DC
Start: 2014-11-17 — End: 2015-01-12

## 2014-11-17 MED ORDER — LITHIUM CARBONATE ER 300 MG PO TBCR: 300.0000 mg | EXTENDED_RELEASE_TABLET | Freq: Two times a day (BID) | ORAL | Status: DC

## 2014-11-17 MED ORDER — HALOPERIDOL 2 MG PO TABS: 2.0000 mg | ORAL_TABLET | Freq: Every day | ORAL | Status: DC

## 2014-11-17 NOTE — Progress Notes (Signed)
Patient ID: Amber Rice, female   DOB: [birth year], 59 y.o.   MRN: [id number] [place] [id number] 59 y.o.  11/17/2014  Chief Complaint: Depression and history of bipolar     History of Present Illness:   Patient returns for Medication Follow up and is diagnosed with bipolar disorder. Current episode possibly depressed.  Patient has been feeling lonely and last visit had concern of moving. She had moved and adjusting. Recently hurt her knee. 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 Ongoing financial issues and stress of being lonely. She does have a sister who she talks with but otherwise she misses her husband her husbands anniversary was in the month of April. She was able to pass the anniversary week and kept strong. Recently sister is being worked up for uterine workup. This upsets her since she has uterine cancer in family. She's not drinking alcohol or using drugs. She remains somewhat overwhelmed about her sister's physical health. Some tremors noticeable. We will get a lithium level first before we consider it may be related to EPS. She says that she has had these tremors for a while no other involuntary movements  She feels taking Ativan and sleeping better has improved her depression and anxiety during the day. She still gets overwhelm when she talks about the move and finances but overall adjusting and restarting ativan has significantly helped her. Has been on ativan in past but was discontinued since then her anxiety has not been in control which effects then her sleep and mood. Duration of bipolar more than for 5 years  Depression scale is 6/10. Improved from 4. Modyifiyng factors. meds and going out to walk. Aggravating factors: poor sleep and finances. medcial conditions and pain. Her sisters health.  TSH is being  monitored by another provider.   She also has D. degenerative joint disease she is currently on pain medications takes when necessary. When she gets depressed she is withdrawn disturbed sleep energy appetite. She was in 2009 when she had a manic episode during which she was talking to herself her mind was racing she was spending too much time in projects and them feeling hyper elevated.  There is no involuntary movements noticeable she is tolerating medication and no reported side effects. Past Medical History  Diagnosis Date  . Hyperlipidemia   . Degenerative disc disease, lumbar     diagnosed in 2005   Family History  Problem Relation Age of Onset  . Cancer Mother   . Heart attack Father   . Hyperlipidemia Father   . Cancer Maternal Grandmother   . Cancer Paternal Grandmother   . Bipolar disorder Paternal Uncle     Committed suicide in his 44's  . Dementia Paternal Uncle   . Depression Cousin   . Alcohol abuse Neg Hx   . Anxiety disorder Neg Hx   . Schizophrenia Cousin   . Multiple sclerosis Sister     Outpatient Encounter Prescriptions as of 11/17/2014  Medication Sig  . cyclobenzaprine (FLEXERIL) 10 MG tablet TAKE 1 TABLET (10 MG TOTAL) BY MOUTH 3 (THREE) TIMES DAILY AS NEEDED FOR MUSCLE SPASMS.  . haloperidol (HALDOL) 2 MG tablet Take 1 tablet (2 mg total) by mouth daily.  Marland Kitchen HYDROcodone-acetaminophen (NORCO/VICODIN) 5-325 MG per tablet Take 1 tablet by mouth every 8 (eight) hours as needed for moderate pain.  Marland Kitchen  lithium carbonate (LITHOBID) 300 MG CR tablet Take 1 tablet (300 mg total) by mouth 2 (two) times daily.  Marland Kitchen LORazepam (ATIVAN) 2 MG tablet Take half tablet a night for anxiety and sleep.  Marland Kitchen sertraline (ZOLOFT) 100 MG tablet Take 2 tablets (200 mg total) by mouth daily.  . [DISCONTINUED] haloperidol (HALDOL) 2 MG tablet Take 1 tablet (2 mg total) by mouth daily.  . [DISCONTINUED] lithium carbonate (LITHOBID) 300 MG CR tablet Take 1 tablet (300 mg total) by mouth 2 (two)  times daily.  . [DISCONTINUED] LORazepam (ATIVAN) 2 MG tablet Take half tablet a night for anxiety and sleep.    No results found for this or any previous visit (from the past 2160 hour(s)).  Pulse 82  Ht 5\' 5"  (1.651 m)  Wt 200 lb (90.719 kg)  BMI 33.28 kg/m2   Review of Systems  Constitutional: Negative.   Cardiovascular: Negative for chest pain.  Skin: Negative for rash.  Neurological: Positive for tremors.  Psychiatric/Behavioral: Negative for suicidal ideas. The patient is nervous/anxious.     Mental Status Examination  Appearance: casual Alert: Yes Attention: fair  Cooperative: Yes Eye Contact: Good Speech: normal Psychomotor Activity: Decreased Memory/Concentration: adequate Oriented: person, place, time/date and situation Mood: somewhat dysphoric Affect: Congruent Thought Processes and Associations: Coherent Fund of Knowledge: Fair Thought Content: Suicidal ideation and Homicidal ideation were denied Insight: Fair Judgement: Fair  Diagnosis: Bipolar disorder type I manic per history for psychotic features. Insomnia NOS. Adjustment disorder with anxiety . Treatment Plan:   Continue Zoloft 200 mg. Lithium 600 mg. Haldol 2 mg at night. There are tremors on outstretched arms but no other involuntary movements noticeable. Ativan 2mg  (half tablet at night) 30 tablets last for 2 months.  Provide down for lithium level and basic lab work. If needed we will cut down the dose of haldol or lithium depending upon the lab work back.   Recommend therapy for her stressors including being lonely and she gets involved in negative thoughts that enhances her depression at times. Pertinent Labs and Relevant Prior Notes reviewed. Medication Side effects, benefits and risks reviewed/discussed with Patient. Time given for patient to respond and asks questions regarding the Diagnosis and Medications. Safety concerns and to report to ER if suicidal or call 911. Relevant Medications  refilled or called in to pharmacy. Discussed weight maintenance and Sleep Hygiene. Follow up with Primary care provider in regards to Medical conditions. Recommend compliance with medications and follow up office appointments. Discussed to avail opportunity to consider or/and continue Individual therapy with Counselor. She will make appointment.  Greater than 50% of time was spend in counseling and coordination of care with the patient.  Schedule for Follow up visit in 2  months or call in earlier as necessary.  [name], MD 11/17/2014

## 2014-11-18 LAB — LITHIUM LEVEL: Lithium Lvl: 0.2 mEq/L — ABNORMAL LOW (ref 0.80–1.40)

## 2014-11-18 LAB — BASIC METABOLIC PANEL
BUN: 10 mg/dL (ref 6–23)
CHLORIDE: 100 meq/L (ref 96–112)
CO2: 26 meq/L (ref 19–32)
Calcium: 9.8 mg/dL (ref 8.4–10.5)
Creat: 0.9 mg/dL (ref 0.50–1.10)
GLUCOSE: 136 mg/dL — AB (ref 70–99)
Potassium: 4.6 mEq/L (ref 3.5–5.3)
SODIUM: 135 meq/L (ref 135–145)

## 2014-11-18 LAB — TSH: TSH: 6.185 u[IU]/mL — ABNORMAL HIGH (ref 0.350–4.500)

## 2014-12-14 ENCOUNTER — Other Ambulatory Visit (HOSPITAL_COMMUNITY): Payer: Self-pay | Admitting: Psychiatry

## 2014-12-14 NOTE — Telephone Encounter (Signed)
Received faxed from pharmacy for Zoloft 100mg . Per Dr. [name], pt is authorized for a refill for Zoloft 100mg , qty 60. Prescription was sent to pharmacy.  Pharmacy will notify the pt of prescription status.  Pt has f/u appt on 6/7.

## 2014-12-22 ENCOUNTER — Ambulatory Visit (INDEPENDENT_AMBULATORY_CARE_PROVIDER_SITE_OTHER): Payer: Medicare Other | Admitting: Licensed Clinical Social Worker

## 2014-12-22 ENCOUNTER — Other Ambulatory Visit: Payer: Self-pay

## 2014-12-22 ENCOUNTER — Other Ambulatory Visit: Payer: Self-pay | Admitting: Physician Assistant

## 2014-12-22 DIAGNOSIS — F3130 Bipolar disorder, current episode depressed, mild or moderate severity, unspecified: Secondary | ICD-10-CM | POA: Diagnosis not present

## 2014-12-22 MED ORDER — HYDROCODONE-ACETAMINOPHEN 5-325 MG PO TABS: 1.0000 | ORAL_TABLET | Freq: Three times a day (TID) | ORAL | Status: DC | PRN

## 2014-12-22 NOTE — Psych (Signed)
   THERAPIST PROGRESS NOTE  Session Time: 9:05-10:00am  Participation Level: Active  Behavioral Response: DisheveledAlert Euthymic  Type of Therapy: Individual Therapy  Treatment Goals addressed: depression  Interventions: Supportive and strengths-based   Therapist Interventions:  Gathered information about significant events and changes in mood and functioning since last seen in March.  Had her complete a PHQ-9 to assess level of depression.  Discussed ways she has been more active.  Discussed how continued social support has been helpful.       Summary:    Has increased her daily activities since last seen.  Helping take care of her sister.  Has been reading more.  Reports praying daily.  Talks to her friends on the phone.   PHQ-9 score was 17.  This is an improvement.  Indicated she is enjoying activities more and having better concentration.   Talked about how she finds comfort in talking to her deceased husband.  Often reminds herself that she did the best she could to take care of him.      ."    Suicidal/Homicidal: Admitted to occasional vague SI without intent.  Denied HI       Plan:   Scheduled to return in approximately 2 weeks.     Diagnosis: Bipolar I, current episode depressed        Armandina Stammer 12/22/2014

## 2015-01-05 ENCOUNTER — Ambulatory Visit (INDEPENDENT_AMBULATORY_CARE_PROVIDER_SITE_OTHER): Payer: Medicare Other | Admitting: Licensed Clinical Social Worker

## 2015-01-05 DIAGNOSIS — F3130 Bipolar disorder, current episode depressed, mild or moderate severity, unspecified: Secondary | ICD-10-CM | POA: Diagnosis not present

## 2015-01-05 NOTE — Progress Notes (Signed)
THERAPIST PROGRESS NOTE  Session Time: 10:25am-11:00am  Participation Level: Active  Behavioral Response: DisheveledAlert Euthymic  Type of Therapy: Individual Therapy  Treatment Goals addressed: depression  Interventions: Supportive and strengths-based   Therapist Interventions:Discussed how patient has continued to focus on being a support for her sister and this has given her a sense of purpose.  Talked about how she can encourage her to think positively.       Summary:  Reported that she has continued to enjoy activities, reading especially.  Concentration has improved.   Sister had a bad experience with a doctor and is transferring care to a different doctor.  Patient has been encouraging her not to lose faith about her condition.  They have been talking on the phone frequently.  Patient is happy to allow her sister to "vent."    Plan: Scheduled to return in approximately one month.

## 2015-01-12 ENCOUNTER — Ambulatory Visit (INDEPENDENT_AMBULATORY_CARE_PROVIDER_SITE_OTHER): Payer: Medicare Other | Admitting: Psychiatry

## 2015-01-12 ENCOUNTER — Encounter (HOSPITAL_COMMUNITY): Payer: Self-pay | Admitting: Psychiatry

## 2015-01-12 ENCOUNTER — Other Ambulatory Visit (HOSPITAL_COMMUNITY): Payer: Self-pay | Admitting: Psychiatry

## 2015-01-12 ENCOUNTER — Encounter (INDEPENDENT_AMBULATORY_CARE_PROVIDER_SITE_OTHER): Payer: Self-pay

## 2015-01-12 VITALS — BP 140/80 | HR 98 | Ht 65.0 in | Wt 200.0 lb

## 2015-01-12 DIAGNOSIS — G259 Extrapyramidal and movement disorder, unspecified: Secondary | ICD-10-CM | POA: Diagnosis not present

## 2015-01-12 DIAGNOSIS — F4322 Adjustment disorder with anxiety: Secondary | ICD-10-CM | POA: Diagnosis not present

## 2015-01-12 DIAGNOSIS — F419 Anxiety disorder, unspecified: Secondary | ICD-10-CM

## 2015-01-12 DIAGNOSIS — F3110 Bipolar disorder, current episode manic without psychotic features, unspecified: Secondary | ICD-10-CM

## 2015-01-12 DIAGNOSIS — F3130 Bipolar disorder, current episode depressed, mild or moderate severity, unspecified: Secondary | ICD-10-CM

## 2015-01-12 DIAGNOSIS — G4700 Insomnia, unspecified: Secondary | ICD-10-CM | POA: Diagnosis not present

## 2015-01-12 MED ORDER — LITHIUM CARBONATE ER 300 MG PO TBCR: 300.0000 mg | EXTENDED_RELEASE_TABLET | Freq: Two times a day (BID) | ORAL | Status: DC

## 2015-01-12 MED ORDER — HALOPERIDOL 1 MG PO TABS: 1.0000 mg | ORAL_TABLET | Freq: Every day | ORAL | Status: DC

## 2015-01-12 MED ORDER — SERTRALINE HCL 100 MG PO TABS: ORAL_TABLET | ORAL | Status: DC

## 2015-01-12 MED ORDER — LORAZEPAM 2 MG PO TABS: ORAL_TABLET | ORAL | Status: DC

## 2015-01-12 NOTE — Progress Notes (Signed)
Patient ID: Menaal Russum, female   DOB: 13-Aug-1955, 59 y.o.   MRN: [id number] [place] [id number] 59 y.o.  01/12/2015  Chief Complaint: Follow up bipolar disorder     History of Present Illness:   Patient returns for Medication Follow up and is diagnosed with bipolar disorder. Current episode possibly depressed. Anxiety disorder NOS.  Noticeable extrapyramidal tremors.  Patient is not endorsing depression or hopelessness but she remains worried about her daughter who may need hysterectomy. Says uterine cancer runs in the family so that this upsets her. She continues to take care of lithium. Lithium level checked at this point to which is low. TSH is high, apparently not taking her levothyroxine.  She's not drinking alcohol or using drugs. She remains somewhat overwhelmed about her sister's physical health. Some tremors noticeable. No other involuntary movements  She feels taking Ativan and sleeping better has improved her depression and anxiety during the day. Ativan also helps her tremors some. She talks about the move and finances but overall adjusting and restarting ativan has significantly helped her.  Duration of bipolar more than for 5 years  Depression scale is 6/10.  Modyifiyng factors. meds and going out to walk. Aggravating factors: poor sleep and finances. medcial conditions and pain. Her sisters health.  TSH is being monitored by another provider.   She also has D. degenerative joint disease she is currently on pain medications takes when necessary. When she gets depressed she is withdrawn disturbed sleep energy appetite. She was in 2009 when she had a manic episode during which she was talking to herself her mind was racing she was spending too much time in projects and them feeling hyper elevated.  There is no involuntary movements noticeable she is tolerating medication and no reported side effects. Past Medical  History  Diagnosis Date  . Hyperlipidemia   . Degenerative disc disease, lumbar     diagnosed in 2005   Family History  Problem Relation Age of Onset  . Cancer Mother   . Heart attack Father   . Hyperlipidemia Father   . Cancer Maternal Grandmother   . Cancer Paternal Grandmother   . Bipolar disorder Paternal Uncle     Committed suicide in his 90's  . Dementia Paternal Uncle   . Depression Cousin   . Alcohol abuse Neg Hx   . Anxiety disorder Neg Hx   . Schizophrenia Cousin   . Multiple sclerosis Sister     Outpatient Encounter Prescriptions as of 01/12/2015  Medication Sig  . cyclobenzaprine (FLEXERIL) 10 MG tablet TAKE 1 TABLET (10 MG TOTAL) BY MOUTH 3 (THREE) TIMES DAILY AS NEEDED FOR MUSCLE SPASMS.  . haloperidol (HALDOL) 1 MG tablet Take 1 tablet (1 mg total) by mouth daily.  Marland Kitchen HYDROcodone-acetaminophen (NORCO/VICODIN) 5-325 MG per tablet Take 1 tablet by mouth every 8 (eight) hours as needed for moderate pain.  Marland Kitchen lithium carbonate (LITHOBID) 300 MG CR tablet Take 1 tablet (300 mg total) by mouth 2 (two) times daily.  Marland Kitchen LORazepam (ATIVAN) 2 MG tablet Take half tablet a night for anxiety and sleep.  Marland Kitchen sertraline (ZOLOFT) 100 MG tablet TAKE 2 TABLETS (200 MG TOTAL) BY MOUTH DAILY.  . [DISCONTINUED] haloperidol (HALDOL) 2 MG tablet Take 1 tablet (2 mg total) by mouth daily.  . [DISCONTINUED] lithium carbonate (LITHOBID) 300 MG CR tablet Take 1 tablet (300 mg total) by mouth 2 (two) times daily.  . [DISCONTINUED] LORazepam (ATIVAN) 2 MG tablet Take  half tablet a night for anxiety and sleep.  . [DISCONTINUED] sertraline (ZOLOFT) 100 MG tablet TAKE 2 TABLETS (200 MG TOTAL) BY MOUTH DAILY.   No facility-administered encounter medications on file as of 01/12/2015.    Recent Results (from the past 2160 hour(s))  Lithium level     Status: Abnormal   Collection Time: 11/17/14  3:42 PM  Result Value Ref Range   Lithium Lvl 0.20 (L) 0.80 - 1.40 mEq/L  TSH     Status: Abnormal    Collection Time: 11/17/14  3:42 PM  Result Value Ref Range   TSH 6.185 (H) 0.350 - 4.500 uIU/mL  Basic metabolic panel     Status: Abnormal   Collection Time: 11/17/14  3:42 PM  Result Value Ref Range   Sodium 135 135 - 145 mEq/L   Potassium 4.6 3.5 - 5.3 mEq/L   Chloride 100 96 - 112 mEq/L   CO2 26 19 - 32 mEq/L   Glucose, Bld 136 (H) 70 - 99 mg/dL   BUN 10 6 - 23 mg/dL   Creat 0.90 0.50 - 1.10 mg/dL   Calcium 9.8 8.4 - 10.5 mg/dL    BP 140/80 mmHg  Pulse 98  Ht 5\' 5"  (1.651 m)  Wt 200 lb (90.719 kg)  BMI 33.28 kg/m2  SpO2 96%   Review of Systems  Constitutional: Negative.   Neurological: Positive for tremors. Negative for headaches.  Psychiatric/Behavioral: Negative for depression and suicidal ideas.    Mental Status Examination  Appearance: casual Alert: Yes Attention: fair  Cooperative: Yes Eye Contact: Good Speech: normal Psychomotor Activity: Decreased Memory/Concentration: adequate Oriented: person, place, time/date and situation Mood: somewhat dysphoric Affect: Congruent Thought Processes and Associations: Coherent Fund of Knowledge: Fair Thought Content: Suicidal ideation and Homicidal ideation were denied Insight: Fair Judgement: Fair  Diagnosis: Bipolar disorder type I manic per history for psychotic features. Insomnia NOS. Adjustment disorder with anxiety . EPS Treatment Plan:   Continue Zoloft 200 mg. Lithium 600 mg.  Lower Haldol 1 mg at night for EPS. There are tremors on outstretched arms but no other involuntary movements noticeable. Ativan 2mg  (half tablet at night) 30 tablets last for 2 months.  Refer to Primary care regard hypothyroid. Says she will fill up her levothyroxine for now.  Recommend therapy for her stressors including being lonely and she gets involved in negative thoughts that enhances her depression at times. Pertinent Labs and Relevant Prior Notes reviewed. Medication Side effects, benefits and risks reviewed/discussed with  Patient. Time given for patient to respond and asks questions regarding the Diagnosis and Medications. Safety concerns and to report to ER if suicidal or call 911. Relevant Medications refilled or called in to pharmacy. Discussed weight maintenance and Sleep Hygiene. Follow up with Primary care provider in regards to Medical conditions. Recommend compliance with medications and follow up office appointments. Discussed to avail opportunity to consider or/and continue Individual therapy with Counselor. She will make appointment.  Greater than 50% of time was spend in counseling and coordination of care with the patient.  Schedule for Follow up visit in 2  months or call in earlier as necessary.  [name], MD 01/12/2015

## 2015-01-19 ENCOUNTER — Ambulatory Visit (HOSPITAL_COMMUNITY): Payer: Self-pay | Admitting: Licensed Clinical Social Worker

## 2015-01-20 ENCOUNTER — Other Ambulatory Visit: Payer: Self-pay | Admitting: Physician Assistant

## 2015-01-20 DIAGNOSIS — Z1211 Encounter for screening for malignant neoplasm of colon: Secondary | ICD-10-CM

## 2015-01-20 DIAGNOSIS — Z139 Encounter for screening, unspecified: Secondary | ICD-10-CM

## 2015-01-20 DIAGNOSIS — Z1231 Encounter for screening mammogram for malignant neoplasm of breast: Secondary | ICD-10-CM

## 2015-01-20 NOTE — Progress Notes (Unsigned)
Pt called requesting a referral for a colonoscopy. She was due for one in 2008. Referral ordered.

## 2015-01-28 ENCOUNTER — Ambulatory Visit (INDEPENDENT_AMBULATORY_CARE_PROVIDER_SITE_OTHER): Payer: Medicare Other | Admitting: Sports Medicine

## 2015-01-28 ENCOUNTER — Encounter: Payer: Self-pay | Admitting: Sports Medicine

## 2015-01-28 VITALS — BP 129/91 | HR 116 | Ht 65.0 in | Wt 202.0 lb

## 2015-01-28 DIAGNOSIS — M19012 Primary osteoarthritis, left shoulder: Secondary | ICD-10-CM

## 2015-01-28 NOTE — Assessment & Plan Note (Signed)
Steroid and Orthovisc injection #1. Return in one week for Orthovisc injection #2 of 4

## 2015-01-28 NOTE — Progress Notes (Signed)
  Subjective:    CC: Left shoulder pain  HPI: Left glenohumeral osteoarthritis: Injection was approximately 3.5 months ago, she is here with a recurrence of pain, at the joint line, injections or typically provided 3-4 months of response. Pain is moderate, persistent. No radiation.  Past medical history, Surgical history, Family history not pertinant except as noted below, Social history, Allergies, and medications have been entered into the medical record, reviewed, and no changes needed.   Review of Systems: No fevers, chills, night sweats, weight loss, chest pain, or shortness of breath.   Objective:    General: Well Developed, well nourished, and in no acute distress.  Neuro: Alert and oriented x3, extra-ocular muscles intact, sensation grossly intact.  HEENT: Normocephalic, atraumatic, pupils equal round reactive to light, neck supple, no masses, no lymphadenopathy, thyroid nonpalpable.  Skin: Warm and dry, no rashes. Cardiac: Regular rate and rhythm, no murmurs rubs or gallops, no lower extremity edema.  Respiratory: Clear to auscultation bilaterally. Not using accessory muscles, speaking in full sentences.  Procedure: Real-time Ultrasound Guided Injection of left glenohumeral joint Device: GE Logiq E  Verbal informed consent obtained.  Time-out conducted.  Noted no overlying erythema, induration, or other signs of local infection.  Skin prepped in a sterile fashion.  Local anesthesia: Topical Ethyl chloride.  With sterile technique and under real time ultrasound guidance:  Spinal needle advanced into joint taking care to avoid the labrum, 30 mg/2 mL of OrthoVisc (sodium hyaluronate) in a prefilled syringe was injected easily into the glenohumeral joint through a 22-gauge spinal needle, syringe switched and 0.5 mL kenalog 40, 3 mL lidocaine injected easily. Completed without difficulty  Pain immediately resolved suggesting accurate placement of the medication.  Advised to call if  fevers/chills, erythema, induration, drainage, or persistent bleeding.  Images permanently stored and available for review in the ultrasound unit.  Impression: Technically successful ultrasound guided injection.  Impression and Recommendations:

## 2015-02-02 ENCOUNTER — Ambulatory Visit (INDEPENDENT_AMBULATORY_CARE_PROVIDER_SITE_OTHER): Payer: Medicare Other | Admitting: Sports Medicine

## 2015-02-02 ENCOUNTER — Encounter: Payer: Self-pay | Admitting: Sports Medicine

## 2015-02-02 ENCOUNTER — Ambulatory Visit (INDEPENDENT_AMBULATORY_CARE_PROVIDER_SITE_OTHER): Payer: Medicare Other | Admitting: Licensed Clinical Social Worker

## 2015-02-02 VITALS — BP 126/81 | HR 121 | Wt 204.0 lb

## 2015-02-02 DIAGNOSIS — F3130 Bipolar disorder, current episode depressed, mild or moderate severity, unspecified: Secondary | ICD-10-CM | POA: Diagnosis not present

## 2015-02-02 DIAGNOSIS — M19012 Primary osteoarthritis, left shoulder: Secondary | ICD-10-CM | POA: Diagnosis not present

## 2015-02-02 NOTE — Assessment & Plan Note (Signed)
Orthovisc injection # 2 of 4 as well as Marcaine. Already starting to feel a response. Return in one week for Orthovisc injection #3

## 2015-02-02 NOTE — Progress Notes (Signed)
  Procedure: Real-time Ultrasound Guided Injection of left glenohumeral joint Device: GE Logiq E  Verbal informed consent obtained.  Time-out conducted.  Noted no overlying erythema, induration, or other signs of local infection.  Skin prepped in a sterile fashion.  Local anesthesia: Topical Ethyl chloride.  With sterile technique and under real time ultrasound guidance:  Spinal needle advanced into the glenohumeral joint  30 mg/2 mL of OrthoVisc (sodium hyaluronate) in a prefilled syringe was injected easily , syringe switched and 3 mL Marcaine injected to flush needle.  Completed without difficulty  Pain immediately resolved suggesting accurate placement of the medication.  Advised to call if fevers/chills, erythema, induration, drainage, or persistent bleeding.  Images permanently stored and available for review in the ultrasound unit.  Impression: Technically successful ultrasound guided injection.

## 2015-02-03 NOTE — Progress Notes (Signed)
THERAPIST PROGRESS NOTE  Session Time: 17:35AP-01:41CV  Participation Level: Active  Behavioral Response: Casual Alert Euthymic  Type of Therapy: Individual Therapy  Treatment Goals addressed: depression  Interventions: Behavioral activation, sleep hygiene   Suicidal/Homicidal Risk: Admits to occasional thoughts of wanting to be with her husband, but denies plan or intent, denies HI  Therapist Interventions: Had patient complete a PHQ-9 to assess for depressive symptoms.   Talked about the importance of building routine into daily life.   Discussed the idea of volunteering because of her history of enjoying caring for others.    Reviewed several tips for improving sleep habits.  Assessed patient's willingness to apply the suggestions.     Summary:  PHQ-9 score was 15 (moderate).  Items she rated as most bothersome were anhedonia, depressed mood, insomnia, fatigue, and trouble concentrating.  Noted she has not been reading as much because she ran out of books.  Therapist suggested going to [employer].  Patient said she did not know where it was but indicated she liked the idea of borrowing books. Admitted to a general lack of routine.  Sleep schedule is not consistent.  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  Eating schedule is not consistent.  Tends to eat mostly fast food.  Not motivated to cook.   Expressed interest in volunteering at an assisted living facility.  Likes the idea of visiting with residents.  Says she will look into it "once things settle down," meaning once she finishes getting injections in her shoulder and her sister gets her hysterectomy. Admits to drinking caffienated beverages throughout the day.  Not willing to cut back on these for the sake of better sleep.  The sleep tips she indicated she is willing to try are turning the clock around so she doesn't obsess about amount of hours of sleep and focusing on breathing while  counting down from 100.         Plan: Scheduled to return in approximately one month.    Diagnosis: Bipolar I Disorder, most recent episode depressed   Rica Records, LCSW 02/02/15

## 2015-02-04 ENCOUNTER — Other Ambulatory Visit: Payer: Self-pay

## 2015-02-04 MED ORDER — HYDROCODONE-ACETAMINOPHEN 5-325 MG PO TABS: 1.0000 | ORAL_TABLET | Freq: Three times a day (TID) | ORAL | Status: DC | PRN

## 2015-02-11 ENCOUNTER — Encounter: Payer: Self-pay | Admitting: Sports Medicine

## 2015-02-11 ENCOUNTER — Ambulatory Visit (INDEPENDENT_AMBULATORY_CARE_PROVIDER_SITE_OTHER): Payer: Medicare Other | Admitting: Sports Medicine

## 2015-02-11 VITALS — BP 134/79 | HR 104 | Ht 65.0 in | Wt 206.0 lb

## 2015-02-11 DIAGNOSIS — M19012 Primary osteoarthritis, left shoulder: Secondary | ICD-10-CM | POA: Diagnosis not present

## 2015-02-11 NOTE — Assessment & Plan Note (Signed)
Orthovisc injection #3 of 4 chased by lidocaine. Feeling good, return in one week for injection #4.

## 2015-02-11 NOTE — Progress Notes (Signed)
  Procedure: Real-time Ultrasound Guided Injection of left glenohumeral joint Device: GE Logiq E  Verbal informed consent obtained.  Time-out conducted.  Noted no overlying erythema, induration, or other signs of local infection.  Skin prepped in a sterile fashion.  Local anesthesia: Topical Ethyl chloride.  With sterile technique and under real time ultrasound guidance:  Spinal needle advanced into the glenohumeral joint  30 mg/2 mL of OrthoVisc (sodium hyaluronate) in a prefilled syringe was injected easily , syringe switched and 75mL lidocaine injected to flush needle.  Completed without difficulty  Pain immediately resolved suggesting accurate placement of the medication.  Advised to call if fevers/chills, erythema, induration, drainage, or persistent bleeding.  Images permanently stored and available for review in the ultrasound unit.  Impression: Technically successful ultrasound guided injection.

## 2015-02-15 ENCOUNTER — Ambulatory Visit: Payer: Self-pay | Admitting: Sports Medicine

## 2015-02-16 ENCOUNTER — Ambulatory Visit (INDEPENDENT_AMBULATORY_CARE_PROVIDER_SITE_OTHER): Payer: Medicare Other | Admitting: Sports Medicine

## 2015-02-16 ENCOUNTER — Encounter: Payer: Self-pay | Admitting: Sports Medicine

## 2015-02-16 VITALS — BP 135/81 | HR 104 | Temp 98.2°F | Wt 203.0 lb

## 2015-02-16 DIAGNOSIS — M19012 Primary osteoarthritis, left shoulder: Secondary | ICD-10-CM

## 2015-02-16 NOTE — Assessment & Plan Note (Signed)
Orthovisc No. 4 of 4 into the left glenohumeral joint.  Currently pain-free.  Return as needed.

## 2015-02-16 NOTE — Progress Notes (Signed)
  Procedure: Real-time Ultrasound Guided Injection of left glenohumeral joint Device: GE Logiq E  Verbal informed consent obtained.  Time-out conducted.  Noted no overlying erythema, induration, or other signs of local infection.  Skin prepped in a sterile fashion.  Local anesthesia: Topical Ethyl chloride.  With sterile technique and under real time ultrasound guidance:  Spinal needle advanced into the glenohumeral joint  30 mg/2 mL of OrthoVisc (sodium hyaluronate) in a prefilled syringe was injected easily , syringe switched and 18mL marcaine injected to flush needle.  Completed without difficulty  Pain immediately resolved suggesting accurate placement of the medication.  Advised to call if fevers/chills, erythema, induration, drainage, or persistent bleeding.  Images permanently stored and available for review in the ultrasound unit.  Impression: Technically successful ultrasound guided injection.

## 2015-02-23 ENCOUNTER — Ambulatory Visit (INDEPENDENT_AMBULATORY_CARE_PROVIDER_SITE_OTHER): Payer: Medicare Other | Admitting: Licensed Clinical Social Worker

## 2015-02-23 DIAGNOSIS — F3130 Bipolar disorder, current episode depressed, mild or moderate severity, unspecified: Secondary | ICD-10-CM | POA: Diagnosis not present

## 2015-02-24 NOTE — Progress Notes (Signed)
THERAPIST PROGRESS NOTE  Session Time: 2:15pm-3:00pm  Participation Level: Active  Behavioral Response: Casual Alert Euthymic  Type of Therapy: Individual Therapy  Treatment Goals addressed: depression  Interventions: Supportive   Suicidal/Homicidal Risk: Denied both    Therapist Interventions: Discussed how patient has been coping with an increase in chronic pain.   Asked about implementation of sleep hygiene tips.   Summary:  Described how she has been experiencing a lot of pain in her back and shoulder.  She also had pain in her mouth from a tooth that had decayed.  She got the tooth pulled last week and since then the pain in her mouth has gradually decreased.  Admitted to spending a lot of time sleeping in part because she is taking pain pills.  REported overall her mood has been "blah."   Reported she did go to [employer] and get a Art therapist card so she could borrow books.  Has enjoyed reading.   Noted that she did practice focused breathing in an attempt to relax her body for sleep.  Michela Pitcher it did help her to feel more relaxed but she still had trouble falling asleep.  She tried turning her clock around, but she couldn't resist the urge to look at the time.        Plan: Scheduled to return in approximately one month.    Diagnosis: Bipolar I Disorder, most recent episode depressed   Rica Records, LCSW 02/23/15

## 2015-03-01 ENCOUNTER — Encounter: Payer: Self-pay | Admitting: Family Medicine

## 2015-03-01 ENCOUNTER — Ambulatory Visit (INDEPENDENT_AMBULATORY_CARE_PROVIDER_SITE_OTHER): Payer: Medicare Other | Admitting: Family Medicine

## 2015-03-01 VITALS — BP 140/87 | HR 104 | Wt 207.0 lb

## 2015-03-01 DIAGNOSIS — M25512 Pain in left shoulder: Secondary | ICD-10-CM

## 2015-03-01 DIAGNOSIS — Z96612 Presence of left artificial shoulder joint: Secondary | ICD-10-CM | POA: Insufficient documentation

## 2015-03-01 MED ORDER — HYDROCODONE-ACETAMINOPHEN 5-325 MG PO TABS: 1.0000 | ORAL_TABLET | Freq: Three times a day (TID) | ORAL | Status: DC | PRN

## 2015-03-01 NOTE — Assessment & Plan Note (Signed)
Left shoulder pain at this point chronic. At this point I'm somewhat unclear where her pain generator is. She may have a glenohumeral disorder versus a rotator cuff issue. Ultrasound was not terribly revealing. She did  experience some relief following subacromial bursa injection. however that she's done well with glenohumeral injections as well. This could indicate a joint sided infraspinatus tear. At this point we'll arrange for MRI to further evaluate the cause of her pain. Additionally refill Norco. Return following MRI.

## 2015-03-01 NOTE — Patient Instructions (Signed)
Thank you for coming in today. Follow up with Dr. Darene Lamer or myself.  You should be called to schedule the MRI.  Let me know if you never hear form them.   Impingement Syndrome, Rotator Cuff, Bursitis with Rehab Impingement syndrome is a condition that involves inflammation of the tendons of the rotator cuff and the subacromial bursa, that causes pain in the shoulder. The rotator cuff consists of four tendons and muscles that control much of the shoulder and upper arm function. The subacromial bursa is a fluid filled sac that helps reduce friction between the rotator cuff and one of the bones of the shoulder (acromion). Impingement syndrome is usually an overuse injury that causes swelling of the bursa (bursitis), swelling of the tendon (tendonitis), and/or a tear of the tendon (strain). Strains are classified into three categories. Grade 1 strains cause pain, but the tendon is not lengthened. Grade 2 strains include a lengthened ligament, due to the ligament being stretched or partially ruptured. With grade 2 strains there is still function, although the function may be decreased. Grade 3 strains include a complete tear of the tendon or muscle, and function is usually impaired. SYMPTOMS   Pain around the shoulder, often at the outer portion of the upper arm.  Pain that gets worse with shoulder function, especially when reaching overhead or lifting.  Sometimes, aching when not using the arm.  Pain that wakes you up at night.  Sometimes, tenderness, swelling, warmth, or redness over the affected area.  Loss of strength.  Limited motion of the shoulder, especially reaching behind the back (to the back pocket or to unhook bra) or across your body.  Crackling sound (crepitation) when moving the arm.  Biceps tendon pain and inflammation (in the front of the shoulder). Worse when bending the elbow or lifting. CAUSES  Impingement syndrome is often an overuse injury, in which chronic (repetitive)  motions cause the tendons or bursa to become inflamed. A strain occurs when a force is paced on the tendon or muscle that is greater than it can withstand. Common mechanisms of injury include: Stress from sudden increase in duration, frequency, or intensity of training.  Direct hit (trauma) to the shoulder.  Aging, erosion of the tendon with normal use.  Bony bump on shoulder (acromial spur). RISK INCREASES WITH:  Contact sports (football, wrestling, boxing).  Throwing sports (baseball, tennis, volleyball).  Weightlifting and bodybuilding.  Heavy labor.  Previous injury to the rotator cuff, including impingement.  Poor shoulder strength and flexibility.  Failure to warm up properly before activity.  Inadequate protective equipment.  Old age.  Bony bump on shoulder (acromial spur). PREVENTION   Warm up and stretch properly before activity.  Allow for adequate recovery between workouts.  Maintain physical fitness:  Strength, flexibility, and endurance.  Cardiovascular fitness.  Learn and use proper exercise technique. PROGNOSIS  If treated properly, impingement syndrome usually goes away within 6 weeks. Sometimes surgery is required.  RELATED COMPLICATIONS   Longer healing time if not properly treated, or if not given enough time to heal.  Recurring symptoms, that result in a chronic condition.  Shoulder stiffness, frozen shoulder, or loss of motion.  Rotator cuff tendon tear.  Recurring symptoms, especially if activity is resumed too soon, with overuse, with a direct blow, or when using poor technique. TREATMENT  Treatment first involves the use of ice and medicine, to reduce pain and inflammation. The use of strengthening and stretching exercises may help reduce pain with activity. These exercises  may be performed at home or with a therapist. If non-surgical treatment is unsuccessful after more than 6 months, surgery may be advised. After surgery and  rehabilitation, activity is usually possible in 3 months.  MEDICATION  If pain medicine is needed, nonsteroidal anti-inflammatory medicines (aspirin and ibuprofen), or other minor pain relievers (acetaminophen), are often advised.  Do not take pain medicine for 7 days before surgery.  Prescription pain relievers may be given, if your caregiver thinks they are needed. Use only as directed and only as much as you need.  Corticosteroid injections may be given by your caregiver. These injections should be reserved for the most serious cases, because they may only be given a certain number of times. HEAT AND COLD  Cold treatment (icing) should be applied for 10 to 15 minutes every 2 to 3 hours for inflammation and pain, and immediately after activity that aggravates your symptoms. Use ice packs or an ice massage.  Heat treatment may be used before performing stretching and strengthening activities prescribed by your caregiver, physical therapist, or athletic trainer. Use a heat pack or a warm water soak. SEEK MEDICAL CARE IF:   Symptoms get worse or do not improve in 4 to 6 weeks, despite treatment.  New, unexplained symptoms develop. (Drugs used in treatment may produce side effects.) EXERCISES  RANGE OF MOTION (ROM) AND STRETCHING EXERCISES - Impingement Syndrome (Rotator Cuff  Tendinitis, Bursitis) These exercises may help you when beginning to rehabilitate your injury. Your symptoms may go away with or without further involvement from your physician, physical therapist or athletic trainer. While completing these exercises, remember:   Restoring tissue flexibility helps normal motion to return to the joints. This allows healthier, less painful movement and activity.  An effective stretch should be held for at least 30 seconds.  A stretch should never be painful. You should only feel a gentle lengthening or release in the stretched tissue. STRETCH - Flexion, Standing  Stand with good  posture. With an underhand grip on your right / left hand, and an overhand grip on the opposite hand, grasp a broomstick or cane so that your hands are a little more than shoulder width apart.  Keeping your right / left elbow straight and shoulder muscles relaxed, push the stick with your opposite hand, to raise your right / left arm in front of your body and then overhead. Raise your arm until you feel a stretch in your right / left shoulder, but before you have increased shoulder pain.  Try to avoid shrugging your right / left shoulder as your arm rises, by keeping your shoulder blade tucked down and toward your mid-back spine. Hold for __________ seconds.  Slowly return to the starting position. Repeat __________ times. Complete this exercise __________ times per day. STRETCH - Abduction, Supine  Lie on your back. With an underhand grip on your right / left hand and an overhand grip on the opposite hand, grasp a broomstick or cane so that your hands are a little more than shoulder width apart.  Keeping your right / left elbow straight and your shoulder muscles relaxed, push the stick with your opposite hand, to raise your right / left arm out to the side of your body and then overhead. Raise your arm until you feel a stretch in your right / left shoulder, but before you have increased shoulder pain.  Try to avoid shrugging your right / left shoulder as your arm rises, by keeping your shoulder blade tucked down  and toward your mid-back spine. Hold for __________ seconds.  Slowly return to the starting position. Repeat __________ times. Complete this exercise __________ times per day. ROM - Flexion, Active-Assisted  Lie on your back. You may bend your knees for comfort.  Grasp a broomstick or cane so your hands are about shoulder width apart. Your right / left hand should grip the end of the stick, so that your hand is positioned "thumbs-up," as if you were about to shake hands.  Using your  healthy arm to lead, raise your right / left arm overhead, until you feel a gentle stretch in your shoulder. Hold for __________ seconds.  Use the stick to assist in returning your right / left arm to its starting position. Repeat __________ times. Complete this exercise __________ times per day.  ROM - Internal Rotation, Supine   Lie on your back on a firm surface. Place your right / left elbow about 60 degrees away from your side. Elevate your elbow with a folded towel, so that the elbow and shoulder are the same height.  Using a broomstick or cane and your strong arm, pull your right / left hand toward your body until you feel a gentle stretch, but no increase in your shoulder pain. Keep your shoulder and elbow in place throughout the exercise.  Hold for __________ seconds. Slowly return to the starting position. Repeat __________ times. Complete this exercise __________ times per day. STRETCH - Internal Rotation  Place your right / left hand behind your back, palm up.  Throw a towel or belt over your opposite shoulder. Grasp the towel with your right / left hand.  While keeping an upright posture, gently pull up on the towel, until you feel a stretch in the front of your right / left shoulder.  Avoid shrugging your right / left shoulder as your arm rises, by keeping your shoulder blade tucked down and toward your mid-back spine.  Hold for __________ seconds. Release the stretch, by lowering your healthy hand. Repeat __________ times. Complete this exercise __________ times per day. ROM - Internal Rotation   Using an underhand grip, grasp a stick behind your back with both hands.  While standing upright with good posture, slide the stick up your back until you feel a mild stretch in the front of your shoulder.  Hold for __________ seconds. Slowly return to your starting position. Repeat __________ times. Complete this exercise __________ times per day.  STRETCH - Posterior Shoulder  Capsule   Stand or sit with good posture. Grasp your right / left elbow and draw it across your chest, keeping it at the same height as your shoulder.  Pull your elbow, so your upper arm comes in closer to your chest. Pull until you feel a gentle stretch in the back of your shoulder.  Hold for __________ seconds. Repeat __________ times. Complete this exercise __________ times per day. STRENGTHENING EXERCISES - Impingement Syndrome (Rotator Cuff Tendinitis, Bursitis) These exercises may help you when beginning to rehabilitate your injury. They may resolve your symptoms with or without further involvement from your physician, physical therapist or athletic trainer. While completing these exercises, remember:  Muscles can gain both the endurance and the strength needed for everyday activities through controlled exercises.  Complete these exercises as instructed by your physician, physical therapist or athletic trainer. Increase the resistance and repetitions only as guided.  You may experience muscle soreness or fatigue, but the pain or discomfort you are trying to eliminate should never  worsen during these exercises. If this pain does get worse, stop and make sure you are following the directions exactly. If the pain is still present after adjustments, discontinue the exercise until you can discuss the trouble with your clinician.  During your recovery, avoid activity or exercises which involve actions that place your injured hand or elbow above your head or behind your back or head. These positions stress the tissues which you are trying to heal. STRENGTH - Scapular Depression and Adduction   With good posture, sit on a firm chair. Support your arms in front of you, with pillows, arm rests, or on a table top. Have your elbows in line with the sides of your body.  Gently draw your shoulder blades down and toward your mid-back spine. Gradually increase the tension, without tensing the muscles  along the top of your shoulders and the back of your neck.  Hold for __________ seconds. Slowly release the tension and relax your muscles completely before starting the next repetition.  After you have practiced this exercise, remove the arm support and complete the exercise in standing as well as sitting position. Repeat __________ times. Complete this exercise __________ times per day.  STRENGTH - Shoulder Abductors, Isometric  With good posture, stand or sit about 4-6 inches from a wall, with your right / left side facing the wall.  Bend your right / left elbow. Gently press your right / left elbow into the wall. Increase the pressure gradually, until you are pressing as hard as you can, without shrugging your shoulder or increasing any shoulder discomfort.  Hold for __________ seconds.  Release the tension slowly. Relax your shoulder muscles completely before you begin the next repetition. Repeat __________ times. Complete this exercise __________ times per day.  STRENGTH - External Rotators, Isometric  Keep your right / left elbow at your side and bend it 90 degrees.  Step into a door frame so that the outside of your right / left wrist can press against the door frame without your upper arm leaving your side.  Gently press your right / left wrist into the door frame, as if you were trying to swing the back of your hand away from your stomach. Gradually increase the tension, until you are pressing as hard as you can, without shrugging your shoulder or increasing any shoulder discomfort.  Hold for __________ seconds.  Release the tension slowly. Relax your shoulder muscles completely before you begin the next repetition. Repeat __________ times. Complete this exercise __________ times per day.  STRENGTH - Supraspinatus   Stand or sit with good posture. Grasp a __________ weight, or an exercise band or tubing, so that your hand is "thumbs-up," like you are shaking hands.  Slowly  lift your right / left arm in a "V" away from your thigh, diagonally into the space between your side and straight ahead. Lift your hand to shoulder height or as far as you can, without increasing any shoulder pain. At first, many people do not lift their hands above shoulder height.  Avoid shrugging your right / left shoulder as your arm rises, by keeping your shoulder blade tucked down and toward your mid-back spine.  Hold for __________ seconds. Control the descent of your hand, as you slowly return to your starting position. Repeat __________ times. Complete this exercise __________ times per day.  STRENGTH - External Rotators  Secure a rubber exercise band or tubing to a fixed object (table, pole) so that it is at the  same height as your right / left elbow when you are standing or sitting on a firm surface.  Stand or sit so that the secured exercise band is at your uninjured side.  Bend your right / left elbow 90 degrees. Place a folded towel or small pillow under your right / left arm, so that your elbow is a few inches away from your side.  Keeping the tension on the exercise band, pull it away from your body, as if pivoting on your elbow. Be sure to keep your body steady, so that the movement is coming only from your rotating shoulder.  Hold for __________ seconds. Release the tension in a controlled manner, as you return to the starting position. Repeat __________ times. Complete this exercise __________ times per day.  STRENGTH - Internal Rotators   Secure a rubber exercise band or tubing to a fixed object (table, pole) so that it is at the same height as your right / left elbow when you are standing or sitting on a firm surface.  Stand or sit so that the secured exercise band is at your right / left side.  Bend your elbow 90 degrees. Place a folded towel or small pillow under your right / left arm so that your elbow is a few inches away from your side.  Keeping the tension on the  exercise band, pull it across your body, toward your stomach. Be sure to keep your body steady, so that the movement is coming only from your rotating shoulder.  Hold for __________ seconds. Release the tension in a controlled manner, as you return to the starting position. Repeat __________ times. Complete this exercise __________ times per day.  STRENGTH - Scapular Protractors, Standing   Stand arms length away from a wall. Place your hands on the wall, keeping your elbows straight.  Begin by dropping your shoulder blades down and toward your mid-back spine.  To strengthen your protractors, keep your shoulder blades down, but slide them forward on your rib cage. It will feel as if you are lifting the back of your rib cage away from the wall. This is a subtle motion and can be challenging to complete. Ask your caregiver for further instruction, if you are not sure you are doing the exercise correctly.  Hold for __________ seconds. Slowly return to the starting position, resting the muscles completely before starting the next repetition. Repeat __________ times. Complete this exercise __________ times per day. STRENGTH - Scapular Protractors, Supine  Lie on your back on a firm surface. Extend your right / left arm straight into the air while holding a __________ weight in your hand.  Keeping your head and back in place, lift your shoulder off the floor.  Hold for __________ seconds. Slowly return to the starting position, and allow your muscles to relax completely before starting the next repetition. Repeat __________ times. Complete this exercise __________ times per day. STRENGTH - Scapular Protractors, Quadruped  Get onto your hands and knees, with your shoulders directly over your hands (or as close as you can be, comfortably).  Keeping your elbows locked, lift the back of your rib cage up into your shoulder blades, so your mid-back rounds out. Keep your neck muscles relaxed.  Hold  this position for __________ seconds. Slowly return to the starting position and allow your muscles to relax completely before starting the next repetition. Repeat __________ times. Complete this exercise __________ times per day.  STRENGTH - Scapular Retractors  Secure a rubber  exercise band or tubing to a fixed object (table, pole), so that it is at the height of your shoulders when you are either standing, or sitting on a firm armless chair.  With a palm down grip, grasp an end of the band in each hand. Straighten your elbows and lift your hands straight in front of you, at shoulder height. Step back, away from the secured end of the band, until it becomes tense.  Squeezing your shoulder blades together, draw your elbows back toward your sides, as you bend them. Keep your upper arms lifted away from your body throughout the exercise.  Hold for __________ seconds. Slowly ease the tension on the band, as you reverse the directions and return to the starting position. Repeat __________ times. Complete this exercise __________ times per day. STRENGTH - Shoulder Extensors   Secure a rubber exercise band or tubing to a fixed object (table, pole) so that it is at the height of your shoulders when you are either standing, or sitting on a firm armless chair.  With a thumbs-up grip, grasp an end of the band in each hand. Straighten your elbows and lift your hands straight in front of you, at shoulder height. Step back, away from the secured end of the band, until it becomes tense.  Squeezing your shoulder blades together, pull your hands down to the sides of your thighs. Do not allow your hands to go behind you.  Hold for __________ seconds. Slowly ease the tension on the band, as you reverse the directions and return to the starting position. Repeat __________ times. Complete this exercise __________ times per day.  STRENGTH - Scapular Retractors and External Rotators   Secure a rubber exercise  band or tubing to a fixed object (table, pole) so that it is at the height as your shoulders, when you are either standing, or sitting on a firm armless chair.  With a palm down grip, grasp an end of the band in each hand. Bend your elbows 90 degrees and lift your elbows to shoulder height, at your sides. Step back, away from the secured end of the band, until it becomes tense.  Squeezing your shoulder blades together, rotate your shoulders so that your upper arms and elbows remain stationary, but your fists travel upward to head height.  Hold for __________ seconds. Slowly ease the tension on the band, as you reverse the directions and return to the starting position. Repeat __________ times. Complete this exercise __________ times per day.  STRENGTH - Scapular Retractors and External Rotators, Rowing   Secure a rubber exercise band or tubing to a fixed object (table, pole) so that it is at the height of your shoulders, when you are either standing, or sitting on a firm armless chair.  With a palm down grip, grasp an end of the band in each hand. Straighten your elbows and lift your hands straight in front of you, at shoulder height. Step back, away from the secured end of the band, until it becomes tense.  Step 1: Squeeze your shoulder blades together. Bending your elbows, draw your hands to your chest, as if you are rowing a boat. At the end of this motion, your hands and elbow should be at shoulder height and your elbows should be out to your sides.  Step 2: Rotate your shoulders, to raise your hands above your head. Your forearms should be vertical and your upper arms should be horizontal.  Hold for __________ seconds. Slowly ease the  tension on the band, as you reverse the directions and return to the starting position. Repeat __________ times. Complete this exercise __________ times per day.  STRENGTH - Scapular Depressors  Find a sturdy chair without wheels, such as a dining room  chair.  Keeping your feet on the floor, and your hands on the chair arms, lift your bottom up from the seat, and lock your elbows.  Keeping your elbows straight, allow gravity to pull your body weight down. Your shoulders will rise toward your ears.  Raise your body against gravity by drawing your shoulder blades down your back, shortening the distance between your shoulders and ears. Although your feet should always maintain contact with the floor, your feet should progressively support less body weight, as you get stronger.  Hold for __________ seconds. In a controlled and slow manner, lower your body weight to begin the next repetition. Repeat __________ times. Complete this exercise __________ times per day.  Document Released: 07/24/2005 Document Revised: 10/16/2011 Document Reviewed: 11/05/2008 [facility] Patient Information 2015 [place], [place]. This information is not intended to replace advice given to you by your health care provider. Make sure you discuss any questions you have with your health care provider.

## 2015-03-01 NOTE — Progress Notes (Signed)
Amber Rice is a 59 y.o. female who presents to [facility]  today for left shoulder pain. Patient has at this point now chronic intermittent left shoulder pain. She's been seen by Dr. Dianah Field previously who diagnosed her with shoulder glenohumeral DJD. She just received her last or Synvisc injection. She notes that she has acute onset of worsening left shoulder pain. The pain is worse with overhead motion reaching back. The pain is located in the anterior lateral aspect of her upper arm. She additionally has pain when she lays on her left side. She denies any fevers or chills. She denies any chest pains or palpitations. She has tried ibuprofen and Norco which have helped. She has not had an MRI of her shoulder yet. X-ray and 2015 did not show severe DJD.   Past Medical History  Diagnosis Date  . Hyperlipidemia   . Degenerative disc disease, lumbar     diagnosed in 2005   Past Surgical History  Procedure Laterality Date  . Abdominal hysterectomy    . Laparotomy    . Appendectomy     History  Substance Use Topics  . Smoking status: Current Every Day Smoker -- 1.00 packs/day for 44 years  . Smokeless tobacco: Not on file  . Alcohol Use: No   ROS as above Medications: Current Outpatient Prescriptions  Medication Sig Dispense Refill  . cyclobenzaprine (FLEXERIL) 10 MG tablet TAKE 1 TABLET (10 MG TOTAL) BY MOUTH 3 (THREE) TIMES DAILY AS NEEDED FOR MUSCLE SPASMS. 30 tablet 0  . haloperidol (HALDOL) 1 MG tablet Take 1 tablet (1 mg total) by mouth daily. 30 tablet 1  . HYDROcodone-acetaminophen (NORCO/VICODIN) 5-325 MG per tablet Take 1 tablet by mouth every 8 (eight) hours as needed for moderate pain. 30 tablet 0  . levothyroxine (SYNTHROID) 50 MCG tablet Take 50 mcg by mouth daily before breakfast.    . lithium carbonate (LITHOBID) 300 MG CR tablet Take 1 tablet (300 mg total) by mouth 2 (two) times daily. 60 tablet 1  . LORazepam (ATIVAN) 2 MG  tablet Take half tablet a night for anxiety and sleep. 30 tablet 0  . sertraline (ZOLOFT) 100 MG tablet TAKE 2 TABLETS (200 MG TOTAL) BY MOUTH DAILY. 60 tablet 1   No current facility-administered medications for this visit.   Allergies  Allergen Reactions  . Pollen Extract Other (See Comments)  . Lipitor [Atorvastatin] Swelling  . Percocet [Oxycodone-Acetaminophen]     Nausea/vomiting  . Zocor [Simvastatin]     Tired and achy all over.      Exam:  BP 140/87 mmHg  Pulse 104  Wt 207 lb (93.895 kg) Gen: Well NAD HEENT: EOMI,  MMM Lungs: Normal work of breathing. CTABL Heart: RRR no MRG Abd: NABS, Soft. Nondistended, Nontender Exts: Brisk capillary refill, warm and well perfused.  Left shoulder: Normal-appearing. Range of motion limited abduction to 100 active and passive External rotation limited to 50 beyond neutral position by pain active and passive. Normal internal rotation. Strength is diminished in abduction testing compared to the contralateral right side. Positive impingement testing. Distal bilateral grip pulses and capillary refill are intact  Limited musculoskeletal ultrasound of the left shoulder: Biceps tendon normal-appearing intact in the groove. Subscapularis insertion normal-appearing Supraspinatus relatively normal appearing with moderate subacromial bursa. Infraspinatus normal appearing Glenohumeral joint without significant effusion  Procedure: Real-time Ultrasound Guided Injection of left subacromial bursa  Device: GE Logiq E  Verbal informed consent obtained. Discussed risks and benefits of procedure.  Warned about infection bleeding damage to structures skin hypopigmentation and fat atrophy among others. Patient expresses understanding and agreement Time-out conducted.  Noted no overlying erythema, induration, or other signs of local infection.  Skin prepped in a sterile fashion.  Local anesthesia: Topical Ethyl chloride.  With sterile  technique and under real time ultrasound guidance: 40 mg Kenalog and 4 mL of Marcaine injected easily.  Completed without difficulty  Pain immediately resolved suggesting accurate placement of the medication.  Advised to call if fevers/chills, erythema, induration, drainage, or persistent bleeding.  Images permanently stored and available for review in the ultrasound unit.  Impression: Technically successful ultrasound guided injection.  No results found for this or any previous visit (from the past 24 hour(s)). No results found.   Please see individual assessment and plan sections.

## 2015-03-03 ENCOUNTER — Telehealth: Payer: Self-pay | Admitting: Physician Assistant

## 2015-03-03 NOTE — Telephone Encounter (Signed)
Pt walked in and states that [facility] ordered for her to have a MRI on Monday August 1 and she would like to have a Valium or Xanex called into her pharmacy so she can take before the MRI

## 2015-03-04 MED ORDER — ALPRAZOLAM 0.25 MG PO TABS: 0.2500 mg | ORAL_TABLET | Freq: Once | ORAL | Status: DC | PRN

## 2015-03-04 NOTE — Telephone Encounter (Signed)
Prescribed 0.25 mg Xanax. She can take 1 or 2 an hour or so or 2 hours before the MRI. Someone else we need to drive her.

## 2015-03-04 NOTE — Telephone Encounter (Signed)
Called and left message notifying pt that the rx will be at the front desk for pick up.

## 2015-03-08 ENCOUNTER — Other Ambulatory Visit: Payer: Self-pay

## 2015-03-09 ENCOUNTER — Ambulatory Visit (HOSPITAL_COMMUNITY): Payer: Self-pay | Admitting: Licensed Clinical Social Worker

## 2015-03-16 ENCOUNTER — Other Ambulatory Visit (HOSPITAL_COMMUNITY): Payer: Self-pay | Admitting: Psychiatry

## 2015-03-17 ENCOUNTER — Other Ambulatory Visit: Payer: Self-pay | Admitting: Physician Assistant

## 2015-03-18 ENCOUNTER — Encounter (HOSPITAL_COMMUNITY): Payer: Self-pay | Admitting: Psychiatry

## 2015-03-18 ENCOUNTER — Ambulatory Visit (INDEPENDENT_AMBULATORY_CARE_PROVIDER_SITE_OTHER): Payer: Medicare Other

## 2015-03-18 ENCOUNTER — Ambulatory Visit (INDEPENDENT_AMBULATORY_CARE_PROVIDER_SITE_OTHER): Payer: Medicare Other | Admitting: Psychiatry

## 2015-03-18 VITALS — BP 130/70 | HR 88 | Ht 65.0 in | Wt 203.0 lb

## 2015-03-18 DIAGNOSIS — F4322 Adjustment disorder with anxiety: Secondary | ICD-10-CM

## 2015-03-18 DIAGNOSIS — Z1231 Encounter for screening mammogram for malignant neoplasm of breast: Secondary | ICD-10-CM

## 2015-03-18 DIAGNOSIS — F3110 Bipolar disorder, current episode manic without psychotic features, unspecified: Secondary | ICD-10-CM

## 2015-03-18 DIAGNOSIS — F419 Anxiety disorder, unspecified: Secondary | ICD-10-CM

## 2015-03-18 DIAGNOSIS — F3130 Bipolar disorder, current episode depressed, mild or moderate severity, unspecified: Secondary | ICD-10-CM

## 2015-03-18 DIAGNOSIS — G259 Extrapyramidal and movement disorder, unspecified: Secondary | ICD-10-CM

## 2015-03-18 MED ORDER — SERTRALINE HCL 100 MG PO TABS: ORAL_TABLET | ORAL | Status: DC

## 2015-03-18 MED ORDER — LORAZEPAM 2 MG PO TABS: ORAL_TABLET | ORAL | Status: DC

## 2015-03-18 MED ORDER — HALOPERIDOL 1 MG PO TABS: 1.0000 mg | ORAL_TABLET | Freq: Every day | ORAL | Status: DC

## 2015-03-18 MED ORDER — LITHIUM CARBONATE ER 300 MG PO TBCR: 300.0000 mg | EXTENDED_RELEASE_TABLET | Freq: Two times a day (BID) | ORAL | Status: DC

## 2015-03-18 NOTE — Progress Notes (Signed)
Patient ID: Amber Rice, female   DOB: 05/16/1956, 59 y.o.   MRN: [id number] [place] [id number] 59 y.o.  03/18/2015  Chief Complaint: Follow up bipolar disorder     History of Present Illness:   Patient returns for Medication Follow up and is diagnosed with bipolar disorder. Current episode possibly depressed. Anxiety disorder NOS.  Noticeable extrapyramidal tremors. Haldol was lowered last visit to 1mg  . She has handled it well with decompensation.  Patient is not endorsing depression or hopelessness. No recent manic symptoms. She has history of getting involve in projects and racing toughts in past.  She is worried about her sister who may need hysterectomy. Says uterine cancer runs in the family so that this upsets her. She continues to take lithium. Lithium level checked at this point to which is low. TSH is high, apparently not taking her levothyroxine. She has had recent shoulder pain that effected her mood but she got injections and is doing better. She's not drinking alcohol or using drugs. She remains somewhat overwhelmed about her sister's physical health. Some tremors noticeable. No other involuntary movements  She feels taking Ativan and sleeping better has improved her depression and anxiety during the day. Ativan also helps her tremors some. She talks about the move and finances but overall adjusting and restarting ativan has significantly helped her.  Duration of bipolar more than for 6  years  Depression scale is 6/10.  Modyifiyng factors. meds and going out to walk. Aggravating factors: poor sleep and finances. medcial conditions and pain. Her sisters health. Shoulder pain  TSH is being monitored by another provider.   She also has D. degenerative joint disease she is currently on pain medications takes when necessary.   There is no involuntary movements noticeable she is tolerating medication and no reported  side effects.  Past Medical History  Diagnosis Date  . Hyperlipidemia   . Degenerative disc disease, lumbar     diagnosed in 2005   Family History  Problem Relation Age of Onset  . Cancer Mother   . Heart attack Father   . Hyperlipidemia Father   . Cancer Maternal Grandmother   . Cancer Paternal Grandmother   . Bipolar disorder Paternal Uncle     Committed suicide in his 51's  . Dementia Paternal Uncle   . Depression Cousin   . Alcohol abuse Neg Hx   . Anxiety disorder Neg Hx   . Schizophrenia Cousin   . Multiple sclerosis Sister     Outpatient Encounter Prescriptions as of 03/18/2015  Medication Sig  . cyclobenzaprine (FLEXERIL) 10 MG tablet TAKE 1 TABLET (10 MG TOTAL) BY MOUTH 3 (THREE) TIMES DAILY AS NEEDED FOR MUSCLE SPASMS.  . haloperidol (HALDOL) 1 MG tablet Take 1 tablet (1 mg total) by mouth daily.  Marland Kitchen HYDROcodone-acetaminophen (NORCO/VICODIN) 5-325 MG per tablet Take 1 tablet by mouth every 8 (eight) hours as needed for moderate pain.  Marland Kitchen levothyroxine (SYNTHROID, LEVOTHROID) 50 MCG tablet Take 1 tablet (50 mcg total) by mouth daily before breakfast. *APPOINTMENT AND LABS ARE REQUIRED FOR FUTURE*  . lithium carbonate (LITHOBID) 300 MG CR tablet Take 1 tablet (300 mg total) by mouth 2 (two) times daily.  Marland Kitchen LORazepam (ATIVAN) 2 MG tablet Take half tablet a night for anxiety and sleep.  Marland Kitchen sertraline (ZOLOFT) 100 MG tablet TAKE 2 TABLETS (200 MG TOTAL) BY MOUTH DAILY.  . [DISCONTINUED] haloperidol (HALDOL) 1 MG tablet Take 1 tablet (1 mg total) by  mouth daily.  . [DISCONTINUED] lithium carbonate (LITHOBID) 300 MG CR tablet Take 1 tablet (300 mg total) by mouth 2 (two) times daily.  . [DISCONTINUED] LORazepam (ATIVAN) 2 MG tablet Take half tablet a night for anxiety and sleep.  . [DISCONTINUED] sertraline (ZOLOFT) 100 MG tablet TAKE 2 TABLETS (200 MG TOTAL) BY MOUTH DAILY.  . [DISCONTINUED] ALPRAZolam (XANAX) 0.25 MG tablet Take 1-2 tablets (0.25-0.5 mg total) by mouth once as  needed (Claustrophobia for MRI). (Patient not taking: Reported on 03/18/2015)   No facility-administered encounter medications on file as of 03/18/2015.    No results found for this or any previous visit (from the past 2160 hour(s)).  BP 130/70 mmHg  Pulse 88  Ht 5\' 5"  (1.651 m)  Wt 203 lb (92.08 kg)  BMI 33.78 kg/m2  SpO2 97%   Review of Systems  Constitutional: Negative for fever.  Skin: Negative for rash.  Neurological: Positive for tremors. Negative for headaches.  Psychiatric/Behavioral: Negative for depression, suicidal ideas and substance abuse.    Mental Status Examination  Appearance: casual Alert: Yes Attention: fair  Cooperative: Yes Eye Contact: Good Speech: normal Psychomotor Activity: Decreased Memory/Concentration: adequate Oriented: person, place, time/date and situation Mood: somewhat dysphoric Affect: Congruent Thought Processes and Associations: Coherent Fund of Knowledge: Fair Thought Content: Suicidal ideation and Homicidal ideation were denied Insight: Fair Judgement: Fair  Diagnosis: Bipolar disorder type I manic per history for psychotic features. Insomnia NOS. Adjustment disorder with anxiety . EPS Treatment Plan:   Continue Zoloft 200 mg. Lithium 600 mg for mood symptoms and bipolar. Haldol 1 mg at night for EPS. There are tremors on outstretched arms but no other involuntary movements noticeable. Ativan 2mg  (half tablet at night) 30 tablets last for 2 months.  Refer to Primary care regard hypothyroid. Says she will fill up her levothyroxine for now.  Recommend therapy for her stressors including being lonely and she gets involved in negative thoughts that enhances her depression at times. Pertinent Labs and Relevant Prior Notes reviewed. Medication Side effects, benefits and risks reviewed/discussed with Patient. Time given for patient to respond and asks questions regarding the Diagnosis and Medications. Safety concerns and to report to ER  if suicidal or call 911. Relevant Medications refilled or called in to pharmacy. Discussed weight maintenance and Sleep Hygiene. Follow up with Primary care provider in regards to Medical conditions. Recommend compliance with medications and follow up office appointments. Discussed to avail opportunity to consider or/and continue Individual therapy with Counselor. She will make appointment.  Greater than 50% of time was spend in counseling and coordination of care with the patient.  Schedule for Follow up visit in 2  months or call in earlier as necessary.  [name], MD 03/18/2015

## 2015-03-23 ENCOUNTER — Ambulatory Visit (HOSPITAL_COMMUNITY): Payer: Self-pay | Admitting: Licensed Clinical Social Worker

## 2015-04-01 ENCOUNTER — Other Ambulatory Visit: Payer: Self-pay

## 2015-04-01 DIAGNOSIS — M25512 Pain in left shoulder: Secondary | ICD-10-CM

## 2015-04-01 MED ORDER — HYDROCODONE-ACETAMINOPHEN 5-325 MG PO TABS: 1.0000 | ORAL_TABLET | Freq: Three times a day (TID) | ORAL | Status: DC | PRN

## 2015-04-01 NOTE — Telephone Encounter (Signed)
Patient advised she will not get another refill without a follow up appointment.

## 2015-04-14 ENCOUNTER — Other Ambulatory Visit: Payer: Self-pay | Admitting: Physician Assistant

## 2015-04-26 ENCOUNTER — Ambulatory Visit (HOSPITAL_COMMUNITY): Payer: Self-pay | Admitting: Licensed Clinical Social Worker

## 2015-05-03 ENCOUNTER — Encounter: Payer: Self-pay | Admitting: Physician Assistant

## 2015-05-03 ENCOUNTER — Ambulatory Visit (INDEPENDENT_AMBULATORY_CARE_PROVIDER_SITE_OTHER): Payer: Medicare Other | Admitting: Physician Assistant

## 2015-05-03 VITALS — BP 151/69 | HR 111 | Ht 65.0 in | Wt 204.0 lb

## 2015-05-03 DIAGNOSIS — E785 Hyperlipidemia, unspecified: Secondary | ICD-10-CM | POA: Diagnosis not present

## 2015-05-03 DIAGNOSIS — M5116 Intervertebral disc disorders with radiculopathy, lumbar region: Secondary | ICD-10-CM | POA: Insufficient documentation

## 2015-05-03 DIAGNOSIS — G5601 Carpal tunnel syndrome, right upper limb: Secondary | ICD-10-CM | POA: Diagnosis not present

## 2015-05-03 DIAGNOSIS — M5030 Other cervical disc degeneration, unspecified cervical region: Secondary | ICD-10-CM | POA: Insufficient documentation

## 2015-05-03 DIAGNOSIS — M25512 Pain in left shoulder: Secondary | ICD-10-CM

## 2015-05-03 DIAGNOSIS — E038 Other specified hypothyroidism: Secondary | ICD-10-CM

## 2015-05-03 DIAGNOSIS — M5137 Other intervertebral disc degeneration, lumbosacral region: Secondary | ICD-10-CM

## 2015-05-03 DIAGNOSIS — G5603 Carpal tunnel syndrome, bilateral upper limbs: Secondary | ICD-10-CM

## 2015-05-03 DIAGNOSIS — G5602 Carpal tunnel syndrome, left upper limb: Secondary | ICD-10-CM

## 2015-05-03 MED ORDER — CYCLOBENZAPRINE HCL 10 MG PO TABS: ORAL_TABLET | ORAL | Status: DC

## 2015-05-03 MED ORDER — EZETIMIBE 10 MG PO TABS: 10.0000 mg | ORAL_TABLET | Freq: Every day | ORAL | Status: DC

## 2015-05-03 MED ORDER — HYDROCODONE-ACETAMINOPHEN 5-325 MG PO TABS: 1.0000 | ORAL_TABLET | Freq: Three times a day (TID) | ORAL | Status: DC | PRN

## 2015-05-03 NOTE — Patient Instructions (Addendum)
Carpal Tunnel Syndrome The carpal tunnel is a narrow area located on the palm side of your wrist. The tunnel is formed by the wrist bones and ligaments. Nerves, blood vessels, and tendons pass through the carpal tunnel. Repeated wrist motion or certain diseases may cause swelling within the tunnel. This swelling pinches the main nerve in the wrist (median nerve) and causes the painful hand and arm condition called carpal tunnel syndrome. CAUSES   Repeated wrist motions.  Wrist injuries.  Certain diseases like arthritis, diabetes, alcoholism, hyperthyroidism, and kidney failure.  Obesity.  Pregnancy. SYMPTOMS   A "pins and needles" feeling in your fingers or hand, especially in your thumb, index and middle fingers.  Tingling or numbness in your fingers or hand.  An aching feeling in your entire arm, especially when your wrist and elbow are bent for long periods of time.  Wrist pain that goes up your arm to your shoulder.  Pain that goes down into your palm or fingers.  A weak feeling in your hands. DIAGNOSIS  Your health care provider will take your history and perform a physical exam. An electromyography test may be needed. This test measures electrical signals sent out by your nerves into the muscles. The electrical signals are usually slowed by carpal tunnel syndrome. You may also need X-rays. TREATMENT  Carpal tunnel syndrome may clear up by itself. Your health care provider may recommend a wrist splint or medicine such as a nonsteroidal anti-inflammatory medicine. Cortisone injections may help. Sometimes, surgery may be needed to free the pinched nerve.  HOME CARE INSTRUCTIONS   Take all medicine as directed by your health care provider. Only take over-the-counter or prescription medicines for pain, discomfort, or fever as directed by your health care provider.  If you were given a splint to keep your wrist from bending, wear it as directed. It is important to wear the splint at  night. Wear the splint for as long as you have pain or numbness in your hand, arm, or wrist. This may take 1 to 2 months.  Rest your wrist from any activity that may be causing your pain. If your symptoms are work-related, you may need to talk to your employer about changing to a job that does not require using your wrist.  Put ice on your wrist after long periods of wrist activity.  Put ice in a plastic bag.  Place a towel between your skin and the bag.  Leave the ice on for 15-20 minutes, 03-04 times a day.  Keep all follow-up visits as directed by your health care provider. This includes any orthopedic referrals, physical therapy, and rehabilitation. Any delay in getting necessary care could result in a delay or failure of your condition to heal. SEEK IMMEDIATE MEDICAL CARE IF:   You have new, unexplained symptoms.  Your symptoms get worse and are not helped or controlled with medicines. MAKE SURE YOU:   Understand these instructions.  Will watch your condition.  Will get help right away if you are not doing well or get worse. Document Released: 07/21/2000 Document Revised: 12/08/2013 Document Reviewed: 06/09/2011 [facility] Patient Information 2015 [place], [place]. This information is not intended to replace advice given to you by your health care provider. Make sure you discuss any questions you have with your health care provider.   Carpal Tunnel Syndrome Carpal tunnel syndrome is a disorder of the nervous system in the wrist that causes pain, hand weakness, and/or loss of feeling. Carpal tunnel syndrome is caused by  the compression, stretching, or irritation of the median nerve at the wrist joint. Athletes who experience carpal tunnel syndrome may notice a decrease in their performance to the condition, especially for sports that require strong hand or wrist action.  SYMPTOMS   Tingling, numbness, or burning pain in the hand or fingers.  Inability to sleep due to pain in the  hand.  Sharp pains that shoot from the wrist up the arm or to the fingers, especially at night.  Morning stiffness or cramping of the hand.  Thumb weakness, resulting in difficulty holding objects or making a fist.  Shiny, dry skin on the hand.  Reduced performance in any sport requiring a strong grip. CAUSES   Median nerve damage at the wrist is caused by pressure due to swelling, inflammation, or scarred tissue.  Sources of pressure include:  Repetitive gripping or squeezing that causes inflammation of the tendon sheaths.  Scarring or shortening of the ligament that covers the median nerve.  Traumatic injury to the wrist or forearm such as fracture, sprain, or dislocation.  Prolonged hyperextension (wrist bent backward) or hyperflexion (wrist bent downward) of the wrist. RISK INCREASES WITH:  Diabetes mellitus.  Menopause or amenorrhea.  Rheumatoid arthritis.  Raynaud disease.  Pregnancy.  Gout.  Kidney disease.  Ganglion cyst.  Repetitive hand or wrist action.  Hypothyroidism (underactive thyroid gland).  Repetitive jolting or shaking of the hands or wrist.  Prolonged forceful weight-bearing on the hands. PREVENTION  Bracing the hand and wrist straight during activities that involve repetitive grasping.  For activities that require prolonged extension of the wrist (bending towards the top of the forearm) periodically change the position of your wrists.  Learn and use proper technique in activities that result in the wrist position in neutral to slight extension.  Avoid bending the wrist into full extension or flexion (up or down).  Keep the wrist in a straight (neutral) position. To keep the wrist in this position, wear a splint.  Avoid repetitive hand and wrist motions.  When possible avoid prolonged grasping of items (steering wheel of a car, a pen, a vacuum cleaner, or a rake).  Loosen your grip for activities that require prolonged grasping of  items.  Place keyboards and writing surfaces at the correct height as to decrease strain on the wrist and hand.  Alternate work tasks to avoid prolonged wrist flexion.  Avoid pinching activities (needlework and writing) as they may irritate your carpal tunnel syndrome.  If these activities are necessary, complete them for shorter periods of time.  When writing, use a felt tip or rollerball pen and/or build up the grip on a pen to decrease the forces required for writing. PROGNOSIS  Carpal tunnel syndrome is usually curable with appropriate conservative treatment and sometimes resolves spontaneously. For some cases, surgery is necessary, especially if muscle wasting or nerve changes have developed.  RELATED COMPLICATIONS   Permanent numbness and a weak thumb or fingers in the affected hand.  Permanent paralysis of a portion of the hand and fingers. TREATMENT  Treatment initially consists of stopping activities that aggravate the symptoms as well as medication and ice to reduce inflammation. A wrist splint is often recommended for wear during activities of repetitive motion as well as at night. It is also important to learn and use proper technique when performing activities that typically cause pain. On occasion, a corticosteroid injection may be given. If symptoms persist despite conservative treatment, surgery may be an option. Surgical techniques free the pinched  or compressed nerve. Carpal tunnel surgery is usually performed on an outpatient basis, meaning you go home the same day as surgery. These procedures provide almost complete relief of all symptoms in 95% of patients. Expect at least 2 weeks for healing after surgery. For cases that are the result of repeated jolting or shaking of the hand or wrist or prolonged hyperextension, surgery is not usually recommended because stretching of the median nerve, not compression, is usually the cause of carpal tunnel syndrome in these  cases. MEDICATION   If pain medication is necessary, nonsteroidal anti-inflammatory medications, such as aspirin and ibuprofen, or other minor pain relievers, such as acetaminophen, are often recommended.  Do not take pain medication for 7 days before surgery.  Prescription pain relievers are usually only prescribed after surgery. Use only as directed and only as much as you need.  Corticosteroid injections may be given to reduce inflammation. However, they are not always recommended.  Vitamin B6 (pyridoxine) may reduce symptoms; use only if prescribed for your disorder. SEEK MEDICAL CARE IF:   Symptoms get worse or do not improve in 2 weeks despite treatment.  You also have a current or recent history of neck or shoulder injury that has resulted in pain or tingling elsewhere in your arm. Document Released: 07/24/2005 Document Revised: 12/08/2013 Document Reviewed: 11/05/2008 [facility] Patient Information 2015 [place], [place]. This information is not intended to replace advice given to you by your health care provider. Make sure you discuss any questions you have with your health care provider.

## 2015-05-03 NOTE — Progress Notes (Signed)
   Subjective:    Patient ID: Amber Rice, female    DOB: 1955/08/27, 60 y.o.   MRN: [id number]  HPI  Patient is a 59 year old female who presents to the clinic to discuss multiple concerns.   Left shoulder pain ongoing managed by Dr. [name].   Lumbar DDD- last MRI done many years ago and did show some disc bulge. Concerned that could be worsening since for last 2 years her legs go numb from buttocks down the back of her legs into both feet. Worse with walking and standing. Better with sitting. PT made worse in 2015. Denies any saddle anesthesia or bowel or bladder dysfunction.   Review of Systems  All other systems reviewed and are negative.      Objective:   Physical Exam  Constitutional: She is oriented to person, place, and time. She appears well-developed and well-nourished.  HENT:  Head: Normocephalic and atraumatic.  Cardiovascular: Normal rate, regular rhythm and normal heart sounds.   Pulmonary/Chest: Effort normal and breath sounds normal.  Musculoskeletal:  Tenderness to palpation over lumbar spine.  Decreased 1+ patellar reflexes on right.  2+ left patellar.  Positive straight leg bilaterally with numbness into bilateral feet.  ROM decreased in all directions at waist due to pain.  Strength 4/5 bilateral lower extremities.   Positive Phalens bilaterally hand numbness worse on left.  Negative Tinels.  No tenderness over bilateral wrists and hands.  No atrophy over thenar emience.  Strength 5/5 hand grip.   Neurological: She is oriented to person, place, and time.  Skin: Skin is dry.  Psychiatric: She has a normal mood and affect. Her behavior is normal.          Assessment & Plan:  Lumbar DDD, herniation- will order MRI to further evaluate. Refilled flexeril and Norco. Discussed prednisone but wanted to hold off at this time. It does tend to change her mood.  PT made worse in 2015. Declined PT. After MRI could consider EMG or ABI.  Left shoulder pain- MRI  ordered by Dr. [name]. norco for pain.   Carpal tunnel, bilateral- night wrist splints given today. Continue NSAIds. Discussed Ice. Gave handout. Will consider EMG if not improving.   Hyperlipidemia- not been checked in a while but not on medication. livalo was tolerated but not affordable. Will start zetia and recheck in 3 months.

## 2015-05-04 ENCOUNTER — Other Ambulatory Visit: Payer: Self-pay | Admitting: *Deleted

## 2015-05-04 LAB — TSH: TSH: 3.413 u[IU]/mL (ref 0.350–4.500)

## 2015-05-04 MED ORDER — LEVOTHYROXINE SODIUM 50 MCG PO TABS: ORAL_TABLET | ORAL | Status: DC

## 2015-05-10 ENCOUNTER — Ambulatory Visit (INDEPENDENT_AMBULATORY_CARE_PROVIDER_SITE_OTHER): Payer: Medicare Other

## 2015-05-10 DIAGNOSIS — M5137 Other intervertebral disc degeneration, lumbosacral region: Secondary | ICD-10-CM

## 2015-05-10 DIAGNOSIS — M5116 Intervertebral disc disorders with radiculopathy, lumbar region: Secondary | ICD-10-CM

## 2015-05-10 DIAGNOSIS — M5126 Other intervertebral disc displacement, lumbar region: Secondary | ICD-10-CM | POA: Diagnosis not present

## 2015-05-13 ENCOUNTER — Encounter (HOSPITAL_COMMUNITY): Payer: Self-pay | Admitting: Psychiatry

## 2015-05-13 ENCOUNTER — Ambulatory Visit (INDEPENDENT_AMBULATORY_CARE_PROVIDER_SITE_OTHER): Payer: Medicare Other | Admitting: Psychiatry

## 2015-05-13 VITALS — BP 134/82 | HR 91 | Ht 65.0 in | Wt 205.0 lb

## 2015-05-13 DIAGNOSIS — G259 Extrapyramidal and movement disorder, unspecified: Secondary | ICD-10-CM

## 2015-05-13 DIAGNOSIS — F3130 Bipolar disorder, current episode depressed, mild or moderate severity, unspecified: Secondary | ICD-10-CM | POA: Diagnosis not present

## 2015-05-13 DIAGNOSIS — F419 Anxiety disorder, unspecified: Secondary | ICD-10-CM | POA: Diagnosis not present

## 2015-05-13 MED ORDER — SERTRALINE HCL 100 MG PO TABS: ORAL_TABLET | ORAL | Status: DC

## 2015-05-13 MED ORDER — LITHIUM CARBONATE ER 300 MG PO TBCR: 300.0000 mg | EXTENDED_RELEASE_TABLET | Freq: Two times a day (BID) | ORAL | Status: DC

## 2015-05-13 MED ORDER — HALOPERIDOL 1 MG PO TABS: 1.0000 mg | ORAL_TABLET | Freq: Every day | ORAL | Status: DC

## 2015-05-13 MED ORDER — LORAZEPAM 2 MG PO TABS: ORAL_TABLET | ORAL | Status: DC

## 2015-05-13 NOTE — Progress Notes (Signed)
Patient ID: Amber Rice, female   DOB: June 16, 1956, 59 y.o.   MRN: [id number] [place] [id number] 59 y.o.  05/13/2015  Chief Complaint: Follow up bipolar disorder     History of Present Illness:   Patient returns for Medication Follow up and is diagnosed with bipolar disorder. Current episode possibly depressed. Anxiety disorder NOS.   Since Haldol was lowered last visit to 1mg , she is experiencing less EPS.  Patient is not endorsing depression or hopelessness. No recent manic symptoms. She has history of getting involve in projects and racing toughts in past.  She is worried about her sister who recently had hysterectomy. Says uterine cancer runs in the family so that this upsets her. Endometriosis was widespread. She continues to take lithium. Lithium level checked at this point to which is low. TSH is high, apparently not taking her levothyroxine. She has had recent shoulder pain that effected her mood but she got injections and is doing better. She's not drinking alcohol or using drugs. She remains somewhat overwhelmed about her sister's physical health. Some tremors noticeable. No other involuntary movements  She feels taking Ativan and sleeping better has improved her depression and anxiety during the day. Ativan also helps her tremors some. She talks about the move and finances but overall adjusting and restarting ativan has significantly helped her.  Duration of bipolar more than for 6  years  Depression scale is 6/10.  Modyifiyng factors. meds and going out to walk. Aggravating factors: poor sleep and finances. medcial conditions and pain. Her sisters health. Shoulder pain  TSH is being monitored by another provider.   She also has D. degenerative joint disease she is currently on pain medications takes when necessary.   There is no involuntary movements noticeable she is tolerating medication and no reported side  effects.  Past Medical History  Diagnosis Date  . Hyperlipidemia   . Degenerative disc disease, lumbar     diagnosed in 2005   Family History  Problem Relation Age of Onset  . Cancer Mother   . Heart attack Father   . Hyperlipidemia Father   . Cancer Maternal Grandmother   . Cancer Paternal Grandmother   . Bipolar disorder Paternal Uncle     Committed suicide in his 57's  . Dementia Paternal Uncle   . Depression Cousin   . Alcohol abuse Neg Hx   . Anxiety disorder Neg Hx   . Schizophrenia Cousin   . Multiple sclerosis Sister     Outpatient Encounter Prescriptions as of 05/13/2015  Medication Sig  . cyclobenzaprine (FLEXERIL) 10 MG tablet TAKE 1 TABLET (10 MG TOTAL) BY MOUTH 3 (THREE) TIMES DAILY AS NEEDED FOR MUSCLE SPASMS.  Marland Kitchen ezetimibe (ZETIA) 10 MG tablet Take 1 tablet (10 mg total) by mouth daily.  . haloperidol (HALDOL) 1 MG tablet Take 1 tablet (1 mg total) by mouth daily.  Marland Kitchen HYDROcodone-acetaminophen (NORCO/VICODIN) 5-325 MG per tablet Take 1 tablet by mouth every 8 (eight) hours as needed for moderate pain.  Marland Kitchen levothyroxine (SYNTHROID, LEVOTHROID) 50 MCG tablet TAKE 1 TABLET BY MOUTH EVERY DAY BEFORE BF  . lithium carbonate (LITHOBID) 300 MG CR tablet Take 1 tablet (300 mg total) by mouth 2 (two) times daily.  Marland Kitchen LORazepam (ATIVAN) 2 MG tablet Take half tablet a night for anxiety and sleep.  Marland Kitchen sertraline (ZOLOFT) 100 MG tablet TAKE 2 TABLETS (200 MG TOTAL) BY MOUTH DAILY.  . [DISCONTINUED] haloperidol (HALDOL) 1 MG tablet Take  1 tablet (1 mg total) by mouth daily.  . [DISCONTINUED] lithium carbonate (LITHOBID) 300 MG CR tablet Take 1 tablet (300 mg total) by mouth 2 (two) times daily.  . [DISCONTINUED] LORazepam (ATIVAN) 2 MG tablet Take half tablet a night for anxiety and sleep.  . [DISCONTINUED] sertraline (ZOLOFT) 100 MG tablet TAKE 2 TABLETS (200 MG TOTAL) BY MOUTH DAILY.   No facility-administered encounter medications on file as of 05/13/2015.    Recent Results  (from the past 2160 hour(s))  TSH     Status: None   Collection Time: 05/03/15  3:15 PM  Result Value Ref Range   TSH 3.413 0.350 - 4.500 uIU/mL    BP 134/82 mmHg  Pulse 91  Ht 5\' 5"  (1.651 m)  Wt 205 lb (92.987 kg)  BMI 34.11 kg/m2  SpO2 96%   Review of Systems  Constitutional: Negative for fever.  Cardiovascular: Negative for chest pain.  Skin: Negative for rash.  Neurological: Positive for tremors. Negative for headaches.  Psychiatric/Behavioral: Negative for depression, suicidal ideas and substance abuse.    Mental Status Examination  Appearance: casual Alert: Yes Attention: fair  Cooperative: Yes Eye Contact: Good Speech: normal Psychomotor Activity: Decreased Memory/Concentration: adequate Oriented: person, place, time/date and situation Mood: somewhat dysphoric Affect: Congruent Thought Processes and Associations: Coherent Fund of Knowledge: Fair Thought Content: Suicidal ideation and Homicidal ideation were denied Insight: Fair Judgement: Fair  Diagnosis: Bipolar disorder type I manic per history for psychotic features. Insomnia NOS. Adjustment disorder with anxiety . EPS Treatment Plan:   Continue Zoloft 200 mg. Lithium 600 mg for mood symptoms and bipolar. Haldol 1 mg at nigh. No significant  EPS now .  Insomnia and anxiety:  Ativan 2mg  (half tablet at night) 30 tablets last for 2 months.  Refer to Primary care regard hypothyroid. Says she will fill up her levothyroxine for now. She is a smoker, not reqdy to quit, counselling done. Recommend therapy for her stressors including being lonely and she gets involved in negative thoughts that enhances her depression at times. Pertinent Labs and Relevant Prior Notes reviewed. Medication Side effects, benefits and risks reviewed/discussed with Patient. Time given for patient to respond and asks questions regarding the Diagnosis and Medications. Safety concerns and to report to ER if suicidal or call  911. Relevant Medications refilled or called in to pharmacy. Discussed weight maintenance and Sleep Hygiene. Follow up with Primary care provider in regards to Medical conditions. Recommend compliance with medications and follow up office appointments. Discussed to avail opportunity to consider or/and continue Individual therapy with Counselor. She will make appointment.  Greater than 50% of time was spend in counseling and coordination of care with the patient.  Schedule for Follow up visit in 2  months or call in earlier as necessary. Time spent: 25 minutes   [name], MD 05/13/2015

## 2015-05-17 ENCOUNTER — Other Ambulatory Visit (HOSPITAL_COMMUNITY): Payer: Self-pay | Admitting: Psychiatry

## 2015-05-17 ENCOUNTER — Ambulatory Visit (INDEPENDENT_AMBULATORY_CARE_PROVIDER_SITE_OTHER): Payer: Medicare Other

## 2015-05-17 DIAGNOSIS — M7582 Other shoulder lesions, left shoulder: Secondary | ICD-10-CM

## 2015-05-17 DIAGNOSIS — M25512 Pain in left shoulder: Secondary | ICD-10-CM

## 2015-05-17 NOTE — Telephone Encounter (Signed)
Received fax from [facility] for a medication request Zoloft 100mg . Per Dr. [name], medication refill was sent to pharmacy on 05/13/15.

## 2015-05-18 NOTE — Progress Notes (Signed)
Quick Note:  MRI shows really bad arthritis and a small rotator cuff tear. F/u with me soon. ______

## 2015-05-19 ENCOUNTER — Ambulatory Visit (INDEPENDENT_AMBULATORY_CARE_PROVIDER_SITE_OTHER): Payer: Medicare Other | Admitting: Family Medicine

## 2015-05-19 ENCOUNTER — Encounter: Payer: Self-pay | Admitting: Family Medicine

## 2015-05-19 ENCOUNTER — Ambulatory Visit: Payer: Self-pay | Admitting: Family Medicine

## 2015-05-19 VITALS — BP 157/76 | HR 108 | Wt 210.0 lb

## 2015-05-19 DIAGNOSIS — M25512 Pain in left shoulder: Secondary | ICD-10-CM | POA: Diagnosis not present

## 2015-05-19 NOTE — Progress Notes (Signed)
Amber Rice is a 59 y.o. female who presents to [facility]: [facility]  today for follow-up left shoulder pain. Patient was seen in July for left shoulder pain. She received a subacromial injection which helped a little bit. At that point MRI was ordered which the patient got last week. She notes persistent severe left shoulder pain. This is despite trials of injection including viscous supplementation injection into her glenohumeral joint.  She denies any fevers chills nausea vomiting or diarrhea.   Past Medical History  Diagnosis Date  . Hyperlipidemia   . Degenerative disc disease, lumbar     diagnosed in 2005   Past Surgical History  Procedure Laterality Date  . Abdominal hysterectomy    . Laparotomy    . Appendectomy     Social History  Substance Use Topics  . Smoking status: Current Every Day Smoker -- 1.00 packs/day for 44 years  . Smokeless tobacco: Not on file  . Alcohol Use: No   family history includes Bipolar disorder in her paternal uncle; Cancer in her maternal grandmother, mother, and paternal grandmother; Dementia in her paternal uncle; Depression in her cousin; Heart attack in her father; Hyperlipidemia in her father; Multiple sclerosis in her sister; Schizophrenia in her cousin. There is no history of Alcohol abuse or Anxiety disorder.  ROS as above Medications: Current Outpatient Prescriptions  Medication Sig Dispense Refill  . cyclobenzaprine (FLEXERIL) 10 MG tablet TAKE 1 TABLET (10 MG TOTAL) BY MOUTH 3 (THREE) TIMES DAILY AS NEEDED FOR MUSCLE SPASMS. 30 tablet 1  . ezetimibe (ZETIA) 10 MG tablet Take 1 tablet (10 mg total) by mouth daily. 90 tablet 0  . haloperidol (HALDOL) 1 MG tablet Take 1 tablet (1 mg total) by mouth daily. 30 tablet 1  . HYDROcodone-acetaminophen (NORCO/VICODIN) 5-325 MG per tablet Take 1 tablet by mouth every 8 (eight) hours as needed for moderate pain. 30 tablet 0  . levothyroxine (SYNTHROID, LEVOTHROID) 50 MCG  tablet TAKE 1 TABLET BY MOUTH EVERY DAY BEFORE BF 30 tablet 5  . lithium carbonate (LITHOBID) 300 MG CR tablet Take 1 tablet (300 mg total) by mouth 2 (two) times daily. 60 tablet 1  . LORazepam (ATIVAN) 2 MG tablet Take half tablet a night for anxiety and sleep. 30 tablet 0  . sertraline (ZOLOFT) 100 MG tablet TAKE 2 TABLETS (200 MG TOTAL) BY MOUTH DAILY. 60 tablet 1   No current facility-administered medications for this visit.   Allergies  Allergen Reactions  . Pollen Extract Other (See Comments)  . Lipitor [Atorvastatin] Swelling  . Percocet [Oxycodone-Acetaminophen]     Nausea/vomiting  . Zocor [Simvastatin]     Tired and achy all over.      Exam:  BP 157/76 mmHg  Pulse 108  Wt 210 lb (95.255 kg) Gen: Well NAD Left shoulder normal-appearing. Nontender. Significant lack of range of motion with external rotation limited to 10-15 beyond neutral position and abduction limited to 100 active and passive. Pain with motion.  Procedure: Real-time Ultrasound Guided Injection of the glenohumeral injection  Device: GE Logiq E  Images permanently stored and available for review in the ultrasound unit. Verbal informed consent obtained. Discussed risks and benefits of procedure. Warned about infection bleeding damage to structures skin hypopigmentation and fat atrophy among others. Patient expresses understanding and agreement Time-out conducted.  Noted no overlying erythema, induration, or other signs of local infection.  Skin prepped in a sterile fashion.  Local anesthesia: Topical Ethyl chloride.  With sterile technique  and under real time ultrasound guidance: 40 mg of Kenalog and 4 mL of Marcaine injected easily.  Completed without difficulty  Pain immediately resolved suggesting accurate placement of the medication.  Advised to call if fevers/chills, erythema, induration, drainage, or persistent bleeding.  Images permanently stored and available for review in the  ultrasound unit.  Impression: Technically successful ultrasound guided injection.   Left Shoulder MRI:  CLINICAL DATA: Chronic left shoulder pain.  EXAM: MRI OF THE LEFT SHOULDER WITHOUT CONTRAST  TECHNIQUE: Multiplanar, multisequence MR imaging of the shoulder was performed. No intravenous contrast was administered.  COMPARISON: None.  FINDINGS: Rotator cuff: Tendinosis of the supraspinatus tendon with a small area of high-grade partial-thickness tear anteriorly measuring 7 mm in anterior-posterior dimension. Infraspinatus tendon is intact. Teres minor tendon is intact. Subscapularis tendon is intact.  Muscles: No atrophy or fatty replacement of nor abnormal signal within, the muscles of the rotator cuff.  Biceps long head: Intact.  Acromioclavicular Joint: Mild degenerative changes of the acromioclavicular joint. Type II acromion. No significant subacromial/subdeltoid bursal fluid.  Glenohumeral Joint: Small joint effusion. High-grade partial-thickness cartilage loss of the glenohumeral joint with subchondral reactive marrow edema. Multiple loose bodies in the subcoracoid recess. Small loose body in the posterior glenohumeral joint.  Labrum: Limited evaluation of the labrum secondary to lack of intra-articular contrast. Attenuation of the superior posterior labrum likely reflecting degeneration.  Bones: No other marrow signal abnormality. No acute fracture or dislocation.  IMPRESSION: 1. High-grade partial-thickness cartilage loss of the glenohumeral joint with subchondral reactive marrow edema most severe in the inferior joint. No significant marginal osteophytosis. 2. Tendinosis of the supraspinatus tendon with a small area of high-grade partial-thickness tear anteriorly measuring 7 mm in anterior - posterior dimension.   Electronically Signed  By: Kathreen Devoid  On: 05/17/2015 16:04    No results found for this or any previous visit  (from the past 24 hour(s)). No results found.   Please see individual assessment and plan sections.

## 2015-05-19 NOTE — Patient Instructions (Signed)
Thank you for coming in today. Return in 1 week to discuss you back and f/u your shoulder.  Call or go to the ER if you develop a large red swollen joint with extreme pain or oozing puss.

## 2015-05-19 NOTE — Assessment & Plan Note (Signed)
Likely due to bad DJD. Plan for repeat trial of corticosteroid injection today. Discussed shoulder replacement surgery. Return in one week.

## 2015-05-21 ENCOUNTER — Ambulatory Visit: Payer: Self-pay | Admitting: Family Medicine

## 2015-05-25 ENCOUNTER — Ambulatory Visit: Payer: Self-pay | Admitting: Family Medicine

## 2015-05-26 ENCOUNTER — Ambulatory Visit (INDEPENDENT_AMBULATORY_CARE_PROVIDER_SITE_OTHER): Payer: Medicare Other | Admitting: Licensed Clinical Social Worker

## 2015-05-26 DIAGNOSIS — F3130 Bipolar disorder, current episode depressed, mild or moderate severity, unspecified: Secondary | ICD-10-CM | POA: Diagnosis not present

## 2015-05-26 DIAGNOSIS — G8929 Other chronic pain: Secondary | ICD-10-CM

## 2015-05-26 NOTE — Progress Notes (Signed)
THERAPIST PROGRESS NOTE  Session Time: 2:00-2:35pm  Participation Level: Active  Behavioral Response: Casual Alert Euthymic  Type of Therapy: Individual Therapy  Treatment Goals addressed: depression  Interventions: Supportive   Suicidal/Homicidal Risk: Denied both    Therapist Interventions:Gathered information about significant events and changes in mood and functioning since last seen in July. Had patient complete a PHQ-9 to assess for severity of depression symptoms. Normalized the increase in depressive thinking since learning about recommendation for shoulder replacement.     Summary:  Reported that her mood had been pretty good up until about a week ago.  That's when she got results from an MRI.  Diagnosed with severe arthritis and a tear in her shoulder.  Her doctor has recommended a total shoulder replacement.  She would like to postpone surgery as long as possible.  Noted that she experiences pain 24/7.  The pain interferes with her ability to sleep.   PHQ-9 score was 13 (moderate).  This is an improvement.  Items she rated as most frequent were anhedonia, depressed mood, insomnia, fatigue, and feeling bad about herself.      Plan: Will schedule next therapy session as needed.   Diagnosis: Bipolar I Disorder, most recent episode depressed                     Chronic pain  Rica Records, LCSW 05/26/15

## 2015-06-25 ENCOUNTER — Ambulatory Visit (HOSPITAL_COMMUNITY): Payer: Self-pay | Admitting: Licensed Clinical Social Worker

## 2015-07-06 ENCOUNTER — Other Ambulatory Visit: Payer: Self-pay | Admitting: *Deleted

## 2015-07-06 DIAGNOSIS — M25512 Pain in left shoulder: Secondary | ICD-10-CM

## 2015-07-06 MED ORDER — HYDROCODONE-ACETAMINOPHEN 5-325 MG PO TABS: 1.0000 | ORAL_TABLET | Freq: Three times a day (TID) | ORAL | Status: DC | PRN

## 2015-07-08 ENCOUNTER — Encounter (HOSPITAL_COMMUNITY): Payer: Self-pay | Admitting: Psychiatry

## 2015-07-08 ENCOUNTER — Ambulatory Visit (INDEPENDENT_AMBULATORY_CARE_PROVIDER_SITE_OTHER): Payer: Medicare Other | Admitting: Psychiatry

## 2015-07-08 VITALS — BP 126/68 | HR 95 | Ht 65.0 in | Wt 208.0 lb

## 2015-07-08 DIAGNOSIS — G8929 Other chronic pain: Secondary | ICD-10-CM | POA: Diagnosis not present

## 2015-07-08 DIAGNOSIS — F3130 Bipolar disorder, current episode depressed, mild or moderate severity, unspecified: Secondary | ICD-10-CM

## 2015-07-08 DIAGNOSIS — F4322 Adjustment disorder with anxiety: Secondary | ICD-10-CM | POA: Diagnosis not present

## 2015-07-08 DIAGNOSIS — F419 Anxiety disorder, unspecified: Secondary | ICD-10-CM

## 2015-07-08 MED ORDER — LITHIUM CARBONATE ER 300 MG PO TBCR: 300.0000 mg | EXTENDED_RELEASE_TABLET | Freq: Two times a day (BID) | ORAL | Status: DC

## 2015-07-08 MED ORDER — SERTRALINE HCL 100 MG PO TABS: ORAL_TABLET | ORAL | Status: DC

## 2015-07-08 MED ORDER — HALOPERIDOL 1 MG PO TABS: 1.0000 mg | ORAL_TABLET | Freq: Every day | ORAL | Status: DC

## 2015-07-08 MED ORDER — BUPROPION HCL 100 MG PO TABS: 100.0000 mg | ORAL_TABLET | Freq: Every morning | ORAL | Status: DC

## 2015-07-08 MED ORDER — LORAZEPAM 2 MG PO TABS: ORAL_TABLET | ORAL | Status: DC

## 2015-07-08 NOTE — Progress Notes (Signed)
Patient ID: Amber Rice, female   DOB: [birth year], 59 y.o.   MRN: [id number] 59 y.o.  07/08/2015  Chief Complaint: Follow up bipolar disorder     History of Present Illness:   Patient returns for Medication Follow up and is diagnosed with bipolar disorder. Current episode  depressed. Anxiety disorder NOS. Insomnia  Since Haldol has been lowered  to 1mg , she is experiencing less EPS.  She is worried about her shoulder which may need replacement because of his MRI results shows degeneration and arthritic changes. She is upset about it and this has led her to get depressed, a motivated decreased energy fatigue and occasional crying spells  She's not drinking alcohol or using drugs. She remains somewhat overwhelmed about her sister's physical health and now her's shoulder surgery pending.  No other involuntary movements  She feels taking Ativan helps her anxiety and tremors.    Duration of bipolar more than for 6  years  Depression scale is 6/10.  Modyifiyng factors. meds and going out to walk. Aggravating factors: poor sleep and finances. medcial conditions and pain. Her sisters health. Shoulder pain and replacement needed.  TSH is being monitored by another provider.   She also has D. degenerative joint disease she is currently on pain medications takes when necessary.     Past Medical History  Diagnosis Date  . Hyperlipidemia   . Degenerative disc disease, lumbar     diagnosed in 2005   Family History  Problem Relation Age of Onset  . Cancer Mother   . Heart attack Father   . Hyperlipidemia Father   . Cancer Maternal Grandmother   . Cancer Paternal Grandmother   . Bipolar disorder Paternal Uncle     Committed suicide in his 85's  . Dementia Paternal Uncle   . Depression Cousin   . Alcohol abuse Neg Hx   . Anxiety disorder Neg Hx   . Schizophrenia Cousin   . Multiple sclerosis Sister      Outpatient Encounter Prescriptions as of 07/08/2015  Medication Sig  . cyclobenzaprine (FLEXERIL) 10 MG tablet TAKE 1 TABLET (10 MG TOTAL) BY MOUTH 3 (THREE) TIMES DAILY AS NEEDED FOR MUSCLE SPASMS.  Marland Kitchen ezetimibe (ZETIA) 10 MG tablet Take 1 tablet (10 mg total) by mouth daily.  Marland Kitchen glucosamine-chondroitin 500-400 MG tablet Take 1 tablet by mouth 2 (two) times daily.  . haloperidol (HALDOL) 1 MG tablet Take 1 tablet (1 mg total) by mouth daily.  Marland Kitchen HYDROcodone-acetaminophen (NORCO/VICODIN) 5-325 MG tablet Take 1 tablet by mouth every 8 (eight) hours as needed for moderate pain.  Marland Kitchen levothyroxine (SYNTHROID, LEVOTHROID) 50 MCG tablet TAKE 1 TABLET BY MOUTH EVERY DAY BEFORE BF  . lithium carbonate (LITHOBID) 300 MG CR tablet Take 1 tablet (300 mg total) by mouth 2 (two) times daily.  Marland Kitchen LORazepam (ATIVAN) 2 MG tablet Take half tablet a night for anxiety and sleep.  Marland Kitchen sertraline (ZOLOFT) 100 MG tablet TAKE 2 TABLETS (200 MG TOTAL) BY MOUTH DAILY.  . [DISCONTINUED] haloperidol (HALDOL) 1 MG tablet Take 1 tablet (1 mg total) by mouth daily.  . [DISCONTINUED] LORazepam (ATIVAN) 2 MG tablet Take half tablet a night for anxiety and sleep.  . [DISCONTINUED] sertraline (ZOLOFT) 100 MG tablet TAKE 2 TABLETS (200 MG TOTAL) BY MOUTH DAILY.  Marland Kitchen buPROPion (WELLBUTRIN) 100 MG tablet Take 1 tablet (100 mg total) by mouth every morning.   No facility-administered encounter medications on file as of  07/08/2015.    Recent Results (from the past 2160 hour(s))  TSH     Status: None   Collection Time: 05/03/15  3:15 PM  Result Value Ref Range   TSH 3.413 0.350 - 4.500 uIU/mL    BP 126/68 mmHg  Pulse 95  Ht 5\' 5"  (1.651 m)  Wt 208 lb (94.348 kg)  BMI 34.61 kg/m2  SpO2 96%   Review of Systems  Constitutional: Negative for fever.  Cardiovascular: Negative for chest pain.  Musculoskeletal: Positive for joint pain.  Skin: Negative for rash.  Neurological: Positive for tremors. Negative for headaches.   Psychiatric/Behavioral: Negative for depression, suicidal ideas and substance abuse.    Mental Status Examination  Appearance: casual Alert: Yes Attention: fair  Cooperative: Yes Eye Contact: Good Speech: normal Psychomotor Activity: Decreased Memory/Concentration: adequate Oriented: person, place, time/date and situation Mood: somewhat dysphoric and concern about her shoulder has added to stress Affect: Congruent Thought Processes and Associations: Coherent Fund of Knowledge: Fair Thought Content: Suicidal ideation and Homicidal ideation were denied Insight: Fair Judgement: Fair  Diagnosis: Bipolar disorder type I manic per history for psychotic features. Insomnia NOS. Adjustment disorder with anxiety . EPS Treatment Plan:   Continue Zoloft 200 mg add small dose of wellbutrin for her current episode of depression.  Lithium 600 mg for mood symptoms and bipolar. Haldol 1 mg at nigh. No significant  EPS now .  Insomnia and anxiety:  Ativan 2mg  (half tablet at night) 30 tablets last for 2 months.  Refer to Primary care regard hypothyroid.  Shoulder pain: has added stress. Would recommend therapy to deal and cope with it.  She is a smoker, not reqdy to quit, counselling done. Recommend therapy for her stressors including being lonely and she gets involved in negative thoughts that enhances her depression at times. Pertinent Labs and Relevant Prior Notes reviewed. Medication Side effects, benefits and risks reviewed/discussed with Patient. Time given for patient to respond and asks questions regarding the Diagnosis and Medications. Safety concerns and to report to ER if suicidal or call 911. Relevant Medications refilled or called in to pharmacy. Discussed weight maintenance and Sleep Hygiene. Follow up with Primary care provider in regards to Medical conditions. Recommend compliance with medications and follow up office appointments. Discussed to avail opportunity to consider  or/and continue Individual therapy with Counselor. She will make appointment.  Greater than 50% of time was spend in counseling and coordination of care with the patient.  Schedule for Follow up visit in 2  months or call in earlier as necessary. Time spent: 25 minutes   [name], MD 07/08/2015

## 2015-07-13 ENCOUNTER — Other Ambulatory Visit (HOSPITAL_COMMUNITY): Payer: Self-pay | Admitting: Psychiatry

## 2015-07-14 NOTE — Telephone Encounter (Signed)
Received medication request from [facility] for Haldol 1mg . Per Dr. [name], medication request is denied. Rx refill for Haldol 1mg  w/ 1 refill was sent to pharmacy on 07/08/15.  Pt has a f/u appt on 09/09/15.

## 2015-07-16 ENCOUNTER — Other Ambulatory Visit: Payer: Self-pay | Admitting: *Deleted

## 2015-07-16 MED ORDER — LEVOTHYROXINE SODIUM 50 MCG PO TABS: ORAL_TABLET | ORAL | Status: DC

## 2015-07-23 ENCOUNTER — Ambulatory Visit (INDEPENDENT_AMBULATORY_CARE_PROVIDER_SITE_OTHER): Payer: Medicare Other | Admitting: Licensed Clinical Social Worker

## 2015-07-23 DIAGNOSIS — F3130 Bipolar disorder, current episode depressed, mild or moderate severity, unspecified: Secondary | ICD-10-CM

## 2015-07-23 NOTE — Progress Notes (Signed)
THERAPIST PROGRESS NOTE  Session Time: 3:05pm-3:50pm  Participation Level: Active  Behavioral Response: Disheveled Alert Depressed  Type of Therapy: Individual Therapy  Treatment Goals addressed: depression  Interventions: Behavioral activation, solution focused  Suicidal/Homicidal Risk: Admitted to having thoughts of "why am I here?" every day but denied having any thoughts of wanting to actually harm herself, denied HI  Therapist Interventions: Had patient complete a PHQ-9 to assess for severity of depression symptoms. Explored current stressors which may be contributing to severity of depression. Talked about the need for patient to adjust daily goals.  For example, washing her hair in the sink if she does not have enough motivation to get into the shower.  Encouraged her to be honest with her sister about her depression so that sister can offer support.      Summary:  Depression screen [facility] 2/9 07/23/2015 05/26/2015 02/02/2015 12/22/2014 10/22/2014  Decreased Interest 3 2 2 2 3   Down, Depressed, Hopeless 3 2 2 2 2   PHQ - 2 Score 6 4 4 4 5   Altered sleeping 3 3 3 3 3   Tired, decreased energy 3 2 2 2 3   Change in appetite 3 0 1 1 1   Feeling bad or failure about yourself  3 2 1 2 1   Trouble concentrating 2 1 2 2 3   Moving slowly or fidgety/restless 3 0 1 2 2   Suicidal thoughts 3 1 1 1 1   PHQ-9 Score 26 13 15 17 19   Difficult doing work/chores Extremely dIfficult - - - -   Reported she has been feeling depressed for "at least the past 6 weeks."  Noted that this is often a difficult time of year for her because of not being able to spend the holidays with her husband, their wedding anniversary on December 28th, and the anniversary of her mother's death on Aug 11, 2023.  Also frustrated about the fact that she needs to get a shoulder replacement.   Admitted she is staying in bed most of the day.  Can't remember the last time she showered.  Sometimes will sleep 17 hours, then be up  for 48 hours.  She is eating some, but mostly junk food. Reflected on how she experienced a similar episode of depression in 2009.  She said, "I was in the bed for months."  Acknowledged that her husband and sister helped her to recover at the time.   Expressed intentions of spending Christmas Eve with her sister, despite how she has been feeling.  Acknowledged she would be disappointed in herself if she did not go.         Plan: Recommended patient schedule another therapy appointment within the next month.   Diagnosis: Bipolar I Disorder, current episode depressed, severe                     Chronic pain  Rica Records, LCSW 07/23/15

## 2015-08-10 ENCOUNTER — Ambulatory Visit (INDEPENDENT_AMBULATORY_CARE_PROVIDER_SITE_OTHER): Payer: Medicare Other | Admitting: Family Medicine

## 2015-08-10 ENCOUNTER — Encounter: Payer: Self-pay | Admitting: Family Medicine

## 2015-08-10 VITALS — BP 126/73 | HR 112 | Wt 213.0 lb

## 2015-08-10 DIAGNOSIS — M19012 Primary osteoarthritis, left shoulder: Secondary | ICD-10-CM

## 2015-08-10 NOTE — Progress Notes (Signed)
Amber Rice is a 60 y.o. female who presents to [facility] today for left shoulder pain. Patient has chronic left shoulder pain attributable to significant glenohumeral chondromalacia. She has been receiving intermittent steroid injections to the glenohumeral joint that has helped significantly. She had one in October that lasted until recently. She would like repeat injection today if possible. She feels well with no other issues.   Past Medical History  Diagnosis Date  . Hyperlipidemia   . Degenerative disc disease, lumbar     diagnosed in 2005   Past Surgical History  Procedure Laterality Date  . Abdominal hysterectomy    . Laparotomy    . Appendectomy     Social History  Substance Use Topics  . Smoking status: Current Every Day Smoker -- 1.00 packs/day for 44 years  . Smokeless tobacco: Not on file  . Alcohol Use: No   family history includes Bipolar disorder in her paternal uncle; Cancer in her maternal grandmother, mother, and paternal grandmother; Dementia in her paternal uncle; Depression in her cousin; Heart attack in her father; Hyperlipidemia in her father; Multiple sclerosis in her sister; Schizophrenia in her cousin. There is no history of Alcohol abuse or Anxiety disorder.  ROS as above Medications: Current Outpatient Prescriptions  Medication Sig Dispense Refill  . cyclobenzaprine (FLEXERIL) 10 MG tablet TAKE 1 TABLET (10 MG TOTAL) BY MOUTH 3 (THREE) TIMES DAILY AS NEEDED FOR MUSCLE SPASMS. 30 tablet 1  . glucosamine-chondroitin 500-400 MG tablet Take 1 tablet by mouth 2 (two) times daily.    . haloperidol (HALDOL) 1 MG tablet Take 1 tablet (1 mg total) by mouth daily. 30 tablet 1  . HYDROcodone-acetaminophen (NORCO/VICODIN) 5-325 MG tablet Take 1 tablet by mouth every 8 (eight) hours as needed for moderate pain. 30 tablet 0  . levothyroxine (SYNTHROID,  LEVOTHROID) 50 MCG tablet TAKE 1 TABLET BY MOUTH EVERY DAY BEFORE BF 90 tablet 1  . lithium carbonate (LITHOBID) 300 MG CR tablet Take 1 tablet (300 mg total) by mouth 2 (two) times daily. 60 tablet 1  . LORazepam (ATIVAN) 2 MG tablet Take half tablet a night for anxiety and sleep. 30 tablet 0  . sertraline (ZOLOFT) 100 MG tablet TAKE 2 TABLETS (200 MG TOTAL) BY MOUTH DAILY. 60 tablet 1   No current facility-administered medications for this visit.   Allergies  Allergen Reactions  . Pollen Extract Other (See Comments)  . Lipitor [Atorvastatin] Swelling  . Percocet [Oxycodone-Acetaminophen]     Nausea/vomiting  . Zocor [Simvastatin]     Tired and achy all over.      Exam:  BP 126/73 mmHg  Pulse 112  Wt 213 lb (96.616 kg) Gen: Well NAD Left shoulder is normal-appearing with no skin changes.  Procedure: Real-time Ultrasound Guided Injection of the glenohumeral joint  Device: GE Logiq E  Images permanently stored and available for review in the ultrasound unit. Verbal informed consent obtained. Discussed risks and benefits of procedure. Warned about infection bleeding damage to structures skin hypopigmentation and fat atrophy among others. Patient expresses understanding and agreement Time-out conducted.  Noted no overlying erythema, induration, or other signs of local infection.  Skin prepped in a sterile fashion.  Local anesthesia: Topical Ethyl chloride.  With sterile technique and under real time ultrasound guidance: 40 mg of Kenalog and 4 mL of Marcaine injected easily.  Completed without difficulty  Pain immediately resolved suggesting accurate placement of the medication.  Advised to  call if fevers/chills, erythema, induration, drainage, or persistent bleeding.  Images permanently stored and available for review in the ultrasound unit.  Impression: Technically successful ultrasound guided injection.    No results found for this or any previous visit (from the  past 24 hour(s)). No results found.   Please see individual assessment and plan sections.

## 2015-08-10 NOTE — Assessment & Plan Note (Signed)
Injection today. Return as needed in 3 months or so. Discussed risks versus benefits of continued injection versus shoulder replacement surgery.

## 2015-08-10 NOTE — Patient Instructions (Signed)
Thank you for coming in today. Return in 3 months or sooner if needed.   Call or go to the ER if you develop a large red swollen joint with extreme pain or oozing puss.

## 2015-08-25 ENCOUNTER — Ambulatory Visit (INDEPENDENT_AMBULATORY_CARE_PROVIDER_SITE_OTHER): Payer: Medicare Other | Admitting: Licensed Clinical Social Worker

## 2015-08-25 DIAGNOSIS — F3130 Bipolar disorder, current episode depressed, mild or moderate severity, unspecified: Secondary | ICD-10-CM | POA: Diagnosis not present

## 2015-08-25 NOTE — Progress Notes (Signed)
THERAPIST PROGRESS NOTE  Session Time: 3:15pm-3:50pm  Participation Level: Active  Behavioral Response: Disheveled Alert Depressed  Type of Therapy: Individual Therapy  Treatment Goals addressed: depression  Interventions: Behavioral activation, solution focused  Suicidal/Homicidal Risk: Denied both  Therapist Interventions:Assessed how patient has been coping with continued symptoms of depression. Praised her for engaging in some activities despite not having motivation. Encouraged her to give herself credit for what she is accomplishing however small a task it may be.    Summary:  Affect was a bit brighter then it had been at patient's last session. Reported having some "good days". Has opened up to her sister about being depressed. Sister is encouraging healthy self-care. Patient's reports having goals to eat healthier and drink more water. He would like to lose some weight. Admits to continuing to spend a lot of time in bed and avoiding showering. Goes to [employer] once every 2-3 weeks to check out some books. Acknowledges it is hard to concentrate. Scheduled for medication management in the near future. Plans to ask psychiatrist if he would recommend any changes to her medication.  Plan: Encouraged patient to schedule next therapy appointment in approximately one month.   Diagnosis: Bipolar I Disorder, current episode depressed, severe                     Chronic pain  Rica Records, LCSW 08/24/15

## 2015-09-07 ENCOUNTER — Other Ambulatory Visit (HOSPITAL_COMMUNITY): Payer: Self-pay | Admitting: Psychiatry

## 2015-09-08 ENCOUNTER — Other Ambulatory Visit: Payer: Self-pay | Admitting: *Deleted

## 2015-09-08 DIAGNOSIS — M25512 Pain in left shoulder: Secondary | ICD-10-CM

## 2015-09-08 MED ORDER — HYDROCODONE-ACETAMINOPHEN 5-325 MG PO TABS: 1.0000 | ORAL_TABLET | Freq: Three times a day (TID) | ORAL | Status: DC | PRN

## 2015-09-09 ENCOUNTER — Encounter (HOSPITAL_COMMUNITY): Payer: Self-pay | Admitting: Psychiatry

## 2015-09-09 ENCOUNTER — Ambulatory Visit (INDEPENDENT_AMBULATORY_CARE_PROVIDER_SITE_OTHER): Payer: Medicare Other | Admitting: Psychiatry

## 2015-09-09 DIAGNOSIS — G259 Extrapyramidal and movement disorder, unspecified: Secondary | ICD-10-CM

## 2015-09-09 DIAGNOSIS — F419 Anxiety disorder, unspecified: Secondary | ICD-10-CM

## 2015-09-09 DIAGNOSIS — G8929 Other chronic pain: Secondary | ICD-10-CM | POA: Diagnosis not present

## 2015-09-09 DIAGNOSIS — F3130 Bipolar disorder, current episode depressed, mild or moderate severity, unspecified: Secondary | ICD-10-CM

## 2015-09-09 MED ORDER — LORAZEPAM 2 MG PO TABS: ORAL_TABLET | ORAL | Status: DC

## 2015-09-09 MED ORDER — SERTRALINE HCL 100 MG PO TABS: ORAL_TABLET | ORAL | Status: DC

## 2015-09-09 MED ORDER — LITHIUM CARBONATE ER 300 MG PO TBCR
300.0000 mg | EXTENDED_RELEASE_TABLET | Freq: Two times a day (BID) | ORAL | Status: DC
Start: 2015-09-09 — End: 2015-11-09

## 2015-09-09 NOTE — Progress Notes (Signed)
Patient ID: Desmonique Helfman, female   DOB: [birth year], 60 y.o.   MRN: [id number] [place] [id number] 60 y.o.  09/09/2015  Chief Complaint: Follow up bipolar disorder     History of Present Illness:   Patient returns for Medication Follow up and is diagnosed with bipolar disorder depressed. Anxiety disorder NOS. Insomnia  She has been cutting down her Haldol as prescribed and recommended. Now she is not unhelpful last for 3 weeks. She feels somewhat more clear. He was cutting down Haldol because of her EPS. Still has some mild shakes but has not gone worse  She is worried about her shoulder which may need replacement because of his MRI results shows degeneration and arthritic changes. She plans to take injections and not do surgery for now She's not drinking alcohol or using drugs. She remains somewhat stressed about her sister's physical health and now her's shoulder surgery pending.  No other involuntary movements  She feels taking Ativan helps her anxiety and tremors.  She appears to be more positive and states that stopping alcohol has made her feel that she can achieve some more goals. No psychotic symptoms Duration of bipolar more than for 6  years  Depression scale is 6/10.  Modyifiyng factors. meds and going out to walk. Aggravating factors: poor sleep and finances. medcial conditions and pain. Her sisters health. Shoulder pain and replacement needed.  TSH is being monitored by another provider.   She also has D. degenerative joint disease she is currently on pain medications takes when necessary.     Past Medical History  Diagnosis Date  . Hyperlipidemia   . Degenerative disc disease, lumbar     diagnosed in 2005   Family History  Problem Relation Age of Onset  . Cancer Mother   . Heart attack Father   . Hyperlipidemia Father   . Cancer Maternal Grandmother   . Cancer Paternal Grandmother   . Bipolar disorder  Paternal Uncle     Committed suicide in his 69's  . Dementia Paternal Uncle   . Depression Cousin   . Alcohol abuse Neg Hx   . Anxiety disorder Neg Hx   . Schizophrenia Cousin   . Multiple sclerosis Sister     Outpatient Encounter Prescriptions as of 09/09/2015  Medication Sig  . cyclobenzaprine (FLEXERIL) 10 MG tablet TAKE 1 TABLET (10 MG TOTAL) BY MOUTH 3 (THREE) TIMES DAILY AS NEEDED FOR MUSCLE SPASMS.  Marland Kitchen glucosamine-chondroitin 500-400 MG tablet Take 1 tablet by mouth 2 (two) times daily.  Marland Kitchen HYDROcodone-acetaminophen (NORCO/VICODIN) 5-325 MG tablet Take 1 tablet by mouth every 8 (eight) hours as needed for moderate pain.  Marland Kitchen levothyroxine (SYNTHROID, LEVOTHROID) 50 MCG tablet TAKE 1 TABLET BY MOUTH EVERY DAY BEFORE BF  . lithium carbonate (LITHOBID) 300 MG CR tablet Take 1 tablet (300 mg total) by mouth 2 (two) times daily.  Marland Kitchen LORazepam (ATIVAN) 2 MG tablet Take half tablet a night for anxiety and sleep.  Marland Kitchen sertraline (ZOLOFT) 100 MG tablet TAKE 2 TABLETS (200 MG TOTAL) BY MOUTH DAILY.  . [DISCONTINUED] haloperidol (HALDOL) 1 MG tablet Take 1 tablet (1 mg total) by mouth daily.  . [DISCONTINUED] lithium carbonate (LITHOBID) 300 MG CR tablet Take 1 tablet (300 mg total) by mouth 2 (two) times daily.  . [DISCONTINUED] LORazepam (ATIVAN) 2 MG tablet Take half tablet a night for anxiety and sleep.  . [DISCONTINUED] sertraline (ZOLOFT) 100 MG tablet TAKE 2 TABLETS (200 MG TOTAL) BY  MOUTH DAILY.   No facility-administered encounter medications on file as of 09/09/2015.    No results found for this or any previous visit (from the past 2160 hour(s)).  There were no vitals taken for this visit.   Review of Systems  Constitutional: Negative for fever.  Cardiovascular: Negative for chest pain.  Musculoskeletal: Positive for joint pain.  Skin: Negative for itching and rash.  Neurological: Positive for tremors. Negative for headaches.  Psychiatric/Behavioral: Negative for depression, suicidal  ideas and substance abuse.    Mental Status Examination  Appearance: casual Alert: Yes Attention: fair  Cooperative: Yes Eye Contact: Good Speech: normal Psychomotor Activity: Decreased Memory/Concentration: adequate Oriented: person, place, time/date and situation Mood: less dysphoric but still at time stressed Affect: Congruent Thought Processes and Associations: Coherent Fund of Knowledge: Fair Thought Content: Suicidal ideation and Homicidal ideation were denied Insight: Fair Judgement: Fair  Diagnosis: Bipolar disorder type I manic per history for psychotic features. Insomnia NOS. Adjustment disorder with anxiety . EPS Treatment Plan:   Continue Zoloft 200 mg add small dose of wellbutrin for her current episode of depression.  Lithium 600 mg for mood symptoms and bipolar. Haldol 1 mg : now stopped Insomnia and anxiety:  Ativan 2mg  (half tablet at night) 30 tablets last for 2 months. Next goal to be tapered down slowly this year Refer to Primary care regard hypothyroid.  Shoulder pain: has added stress. Would recommend therapy to deal and cope with it.  She is a smoker, not reqdy to quit, counselling done.  Medication Side effects, benefits and risks reviewed/discussed with Patient. Time given for patient to respond and asks questions regarding the Diagnosis and Medications. Safety concerns and to report to ER if suicidal or call 911.  Discussed weight maintenance and Sleep Hygiene.   Greater than 50% of time was spend in counseling and coordination of care with the patient.  Schedule for Follow up visit in 2  months or call in earlier as necessary. Time spent: 25 minutes   [name], MD 09/09/2015

## 2015-09-21 ENCOUNTER — Ambulatory Visit (INDEPENDENT_AMBULATORY_CARE_PROVIDER_SITE_OTHER): Payer: Medicare Other | Admitting: Family Medicine

## 2015-09-21 ENCOUNTER — Encounter: Payer: Self-pay | Admitting: Family Medicine

## 2015-09-21 VITALS — BP 165/91 | HR 104 | Wt 215.0 lb

## 2015-09-21 DIAGNOSIS — M533 Sacrococcygeal disorders, not elsewhere classified: Secondary | ICD-10-CM | POA: Diagnosis not present

## 2015-09-21 DIAGNOSIS — G8929 Other chronic pain: Secondary | ICD-10-CM

## 2015-09-21 DIAGNOSIS — M5137 Other intervertebral disc degeneration, lumbosacral region: Secondary | ICD-10-CM

## 2015-09-21 NOTE — Progress Notes (Signed)
Amber Rice is a 60 y.o. female who presents to [facility] today for back pain. Patient has chronic mild back pain. Pain worsened dramatically recently last few days. She denies any new injury. Pain is located predominantly in the lower lumbar back. Pain is worse with extension and better with flexion. No radiating pain weakness or numbness. No new fevers chills nausea vomiting or diarrhea. She's tried some over-the-counter medicines which have helped some.   Past Medical History  Diagnosis Date  . Hyperlipidemia   . Degenerative disc disease, lumbar     diagnosed in 2005   Past Surgical History  Procedure Laterality Date  . Abdominal hysterectomy    . Laparotomy    . Appendectomy     Social History  Substance Use Topics  . Smoking status: Current Every Day Smoker -- 1.00 packs/day for 44 years  . Smokeless tobacco: Not on file  . Alcohol Use: No   family history includes Bipolar disorder in her paternal uncle; Cancer in her maternal grandmother, mother, and paternal grandmother; Dementia in her paternal uncle; Depression in her cousin; Heart attack in her father; Hyperlipidemia in her father; Multiple sclerosis in her sister; Schizophrenia in her cousin. There is no history of Alcohol abuse or Anxiety disorder.  ROS:  No headache, visual changes, nausea, vomiting, diarrhea, constipation, dizziness, abdominal pain, skin rash, fevers, chills, night sweats, weight loss, swollen lymph nodes, body aches, joint swelling, muscle aches, chest pain, shortness of breath, mood changes, visual or auditory hallucinations.    Medications: Current Outpatient Prescriptions  Medication Sig Dispense Refill  . cyclobenzaprine (FLEXERIL) 10 MG tablet TAKE 1 TABLET (10 MG TOTAL) BY MOUTH 3 (THREE) TIMES DAILY AS NEEDED FOR MUSCLE SPASMS. 30 tablet 1  . glucosamine-chondroitin 500-400 MG tablet Take 1 tablet by mouth 2 (two) times daily.    Marland Kitchen  HYDROcodone-acetaminophen (NORCO/VICODIN) 5-325 MG tablet Take 1 tablet by mouth every 8 (eight) hours as needed for moderate pain. 30 tablet 0  . KRILL OIL PO Take by mouth.    . lithium carbonate (LITHOBID) 300 MG CR tablet Take 1 tablet (300 mg total) by mouth 2 (two) times daily. 60 tablet 1  . LORazepam (ATIVAN) 2 MG tablet Take half tablet a night for anxiety and sleep. 30 tablet 0  . sertraline (ZOLOFT) 100 MG tablet TAKE 2 TABLETS (200 MG TOTAL) BY MOUTH DAILY. 60 tablet 1   No current facility-administered medications for this visit.   Allergies  Allergen Reactions  . Pollen Extract Other (See Comments)  . Lipitor [Atorvastatin] Swelling  . Percocet [Oxycodone-Acetaminophen]     Nausea/vomiting  . Zocor [Simvastatin]     Tired and achy all over.      Exam:  BP 165/91 mmHg  Pulse 104  Wt 215 lb (97.523 kg) General: Well Developed, well nourished, and in no acute distress.  Neuro/Psych: Alert and oriented x3, extra-ocular muscles intact, able to move all 4 extremities, sensation grossly intact. Skin: Warm and dry, no rashes noted.  Respiratory: Not using accessory muscles, speaking in full sentences, trachea midline.  Cardiovascular: Pulses palpable, no extremity edema. Abdomen: Does not appear distended. MSK: Back: Nontender to spinal midline. Tender palpation bilateral SI joints. Range of motion very limited extension. Normal flexion and rotation. Lower extremity strength sensation are intact. Patient can stand on her toes and heels squat. She walks with a cane with a slow gait.  Procedure: Real-time Ultrasound Guided Injection of right SI joint  Device:  GE Logiq E  Images permanently stored and available for review in the ultrasound unit. Verbal informed consent obtained. Discussed risks and benefits of procedure. Warned about infection bleeding damage to structures skin hypopigmentation and fat atrophy among others. Patient expresses understanding and  agreement Time-out conducted.  Noted no overlying erythema, induration, or other signs of local infection.  Skin prepped in a sterile fashion.  Local anesthesia: Topical Ethyl chloride.  With sterile technique and under real time ultrasound guidance: 40 mg of Kenalog and 4 mL Marcaine injected easily.  Completed without difficulty  Pain in the SI joint area immediately resolved suggesting accurate placement of the medication.  Advised to call if fevers/chills, erythema, induration, drainage, or persistent bleeding.  Images permanently stored and available for review in the ultrasound unit.  Impression: Technically successful ultrasound guided injection.   Procedure: Real-time Ultrasound Guided Injection of left SI joint  Device: GE Logiq E  Images permanently stored and available for review in the ultrasound unit. Verbal informed consent obtained. Discussed risks and benefits of procedure. Warned about infection bleeding damage to structures skin hypopigmentation and fat atrophy among others. Patient expresses understanding and agreement Time-out conducted.  Noted no overlying erythema, induration, or other signs of local infection.  Skin prepped in a sterile fashion.  Local anesthesia: Topical Ethyl chloride.  With sterile technique and under real time ultrasound guidance: 40 mg of Kenalog and 4 mL of Marcaine injected easily.  Completed without difficulty  Pain in the SI joint immediately resolved suggesting accurate placement of the medication.  Advised to call if fevers/chills, erythema, induration, drainage, or persistent bleeding.  Images permanently stored and available for review in the ultrasound unit.  Impression: Technically successful ultrasound guided injection.   MRI lumbar spine dated October 2016 reviewed   No results found for this or any previous visit (from the past 24 hour(s)). No results found.   Please see individual assessment and plan  sections.

## 2015-09-21 NOTE — Assessment & Plan Note (Addendum)
Patient has posterior element pain as evident by worsening with extension. Suspect her pain is likely due to the bilateral L5-S1 or L4-L5 facets. Plan for facet injection via interventional radiology in the near future. Recheck following injection.  Patient declined physical therapy.

## 2015-09-21 NOTE — Patient Instructions (Signed)
Thank you for coming in today. Return following facet injection.  Call or go to the ER if you develop a large red swollen joint with extreme pain or oozing puss.

## 2015-09-21 NOTE — Assessment & Plan Note (Signed)
Status post injection today. Recheck in a few weeks.

## 2015-09-27 ENCOUNTER — Telehealth: Payer: Self-pay

## 2015-09-27 DIAGNOSIS — M5137 Other intervertebral disc degeneration, lumbosacral region: Secondary | ICD-10-CM

## 2015-09-27 DIAGNOSIS — M5030 Other cervical disc degeneration, unspecified cervical region: Secondary | ICD-10-CM

## 2015-09-27 NOTE — Telephone Encounter (Signed)
Pt called stating that she does not have transportation to het to [facility] radiology for the facet injections you ordered. She would like to know if there is a location in [place] that does these injections.

## 2015-09-28 ENCOUNTER — Ambulatory Visit (INDEPENDENT_AMBULATORY_CARE_PROVIDER_SITE_OTHER): Payer: Medicare Other | Admitting: Licensed Clinical Social Worker

## 2015-09-28 DIAGNOSIS — G8929 Other chronic pain: Secondary | ICD-10-CM

## 2015-09-28 DIAGNOSIS — F3130 Bipolar disorder, current episode depressed, mild or moderate severity, unspecified: Secondary | ICD-10-CM | POA: Diagnosis not present

## 2015-09-28 NOTE — Progress Notes (Signed)
THERAPIST PROGRESS NOTE  Session Time: 3:15pm-3:45pm  Participation Level: Active  Behavioral Response: Disheveled Alert Depressed  Type of Therapy: Individual Therapy  Treatment Goals addressed: depression  Interventions: Behavioral activation, solution focused  Suicidal/Homicidal Risk: Denied both  Therapist Interventions:  Discussed how chronic pain contributes greatly to her depression.  Explored options for engaging in activities despite limitations she faces with her pain.  Encouraged patient to look into getting involved at the local senior center, located next to [employer].     Summary: Reported "I've been so depressed."  Was using a cane to assist her with walking.  Said her back "went out" on February 12th.  Amber Rice "It hurts to do everything."  Has gotten injections to treat her back.  This has lessened the pain to a degree, but not enough for her to engage regularly in activities of daily living.  Spends most of her time in bed lying on her side. Indicated she is open to finding out more information about the local senior center.       Plan: Encouraged Patient to schedule next therapy appointment in approximately one month.   Diagnosis: Bipolar I Disorder, current episode depressed, severe                     Chronic pain  Rica Records, LCSW 09/28/15

## 2015-09-28 NOTE — Telephone Encounter (Signed)
Referral to Dr Earley Favor in [place] ordered.

## 2015-09-28 NOTE — Telephone Encounter (Signed)
Pt.notified

## 2015-09-29 ENCOUNTER — Other Ambulatory Visit: Payer: Self-pay

## 2015-10-07 DIAGNOSIS — G8929 Other chronic pain: Secondary | ICD-10-CM | POA: Diagnosis not present

## 2015-10-07 DIAGNOSIS — M5136 Other intervertebral disc degeneration, lumbar region: Secondary | ICD-10-CM | POA: Diagnosis not present

## 2015-10-07 DIAGNOSIS — M47816 Spondylosis without myelopathy or radiculopathy, lumbar region: Secondary | ICD-10-CM | POA: Diagnosis not present

## 2015-10-07 DIAGNOSIS — Z79899 Other long term (current) drug therapy: Secondary | ICD-10-CM | POA: Diagnosis not present

## 2015-10-26 ENCOUNTER — Ambulatory Visit (HOSPITAL_COMMUNITY): Payer: Self-pay | Admitting: Licensed Clinical Social Worker

## 2015-11-04 DIAGNOSIS — M47816 Spondylosis without myelopathy or radiculopathy, lumbar region: Secondary | ICD-10-CM | POA: Diagnosis not present

## 2015-11-04 DIAGNOSIS — G8929 Other chronic pain: Secondary | ICD-10-CM | POA: Diagnosis not present

## 2015-11-04 DIAGNOSIS — M19012 Primary osteoarthritis, left shoulder: Secondary | ICD-10-CM | POA: Diagnosis not present

## 2015-11-08 ENCOUNTER — Ambulatory Visit (HOSPITAL_COMMUNITY): Payer: Self-pay | Admitting: Psychiatry

## 2015-11-08 ENCOUNTER — Ambulatory Visit: Payer: Self-pay | Admitting: Family Medicine

## 2015-11-08 ENCOUNTER — Other Ambulatory Visit (HOSPITAL_COMMUNITY): Payer: Self-pay | Admitting: Psychiatry

## 2015-11-09 ENCOUNTER — Ambulatory Visit (INDEPENDENT_AMBULATORY_CARE_PROVIDER_SITE_OTHER): Payer: Medicare Other | Admitting: Psychiatry

## 2015-11-09 ENCOUNTER — Encounter (HOSPITAL_COMMUNITY): Payer: Self-pay | Admitting: Psychiatry

## 2015-11-09 VITALS — BP 132/74 | HR 92 | Ht 65.0 in | Wt 210.0 lb

## 2015-11-09 DIAGNOSIS — F419 Anxiety disorder, unspecified: Secondary | ICD-10-CM | POA: Diagnosis not present

## 2015-11-09 DIAGNOSIS — F3130 Bipolar disorder, current episode depressed, mild or moderate severity, unspecified: Secondary | ICD-10-CM | POA: Diagnosis not present

## 2015-11-09 DIAGNOSIS — G259 Extrapyramidal and movement disorder, unspecified: Secondary | ICD-10-CM

## 2015-11-09 DIAGNOSIS — G8929 Other chronic pain: Secondary | ICD-10-CM | POA: Diagnosis not present

## 2015-11-09 MED ORDER — SERTRALINE HCL 100 MG PO TABS: ORAL_TABLET | ORAL | Status: DC

## 2015-11-09 MED ORDER — HALOPERIDOL 2 MG PO TABS: 2.0000 mg | ORAL_TABLET | Freq: Every evening | ORAL | Status: DC | PRN

## 2015-11-09 MED ORDER — LORAZEPAM 2 MG PO TABS: ORAL_TABLET | ORAL | Status: DC

## 2015-11-09 MED ORDER — LITHIUM CARBONATE ER 300 MG PO TBCR: 300.0000 mg | EXTENDED_RELEASE_TABLET | Freq: Two times a day (BID) | ORAL | Status: DC

## 2015-11-09 NOTE — Progress Notes (Signed)
Patient ID: Amber Rice, female   DOB: 07/19/1956, 60 y.o.   MRN: [id number] [place] [id number] 60 y.o.  11/09/2015  Chief Complaint: Follow up bipolar disorder    History of Present Illness:   Patient returns for Medication Follow up and is diagnosed with bipolar disorder depressed. Anxiety disorder NOS. Insomnia   She has been cutting down her Haldol to last visit and it was she was not taking and by that time. Now there is some recurrence of paranoia she feels the government is watching her she feels is not paranoia but for real she feels since 2009 that the government agencies may be looking around and keeping a tap on her .  Other stressors: She is worried about her shoulder which may need replacement because of his MRI results shows degeneration and arthritic changes. She plans to take injections and not do surgery for now She's not drinking alcohol or using drugs. She remains somewhat stressed about her sister's physical health and now her's shoulder surgery pending.  No other involuntary movements  She feels taking Ativan helps her anxiety and tremors.  She feels more distress due to her back and shoulder condition and has to get injections.  Duration of bipolar more than for 6  years  Depression scale is 5/10. (worsened) Modyifiyng factors. meds and going out to walk. Aggravating factors: poor sleep and finances. medcial conditions and pain. Her sisters health. Shoulder pain and replacement needed.  TSH is being monitored by another provider.   She also has D. degenerative joint disease she is currently on pain medications takes when necessary.     Past Medical History  Diagnosis Date  . Hyperlipidemia   . Degenerative disc disease, lumbar     diagnosed in 2005   Family History  Problem Relation Age of Onset  . Cancer Mother   . Heart attack Father   . Hyperlipidemia Father   . Cancer Maternal Grandmother    . Cancer Paternal Grandmother   . Bipolar disorder Paternal Uncle     Committed suicide in his 18's  . Dementia Paternal Uncle   . Depression Cousin   . Alcohol abuse Neg Hx   . Anxiety disorder Neg Hx   . Schizophrenia Cousin   . Multiple sclerosis Sister     Outpatient Encounter Prescriptions as of 11/09/2015  Medication Sig  . HYDROcodone-acetaminophen (NORCO/VICODIN) 5-325 MG tablet Take 1 tablet by mouth every 8 (eight) hours as needed for moderate pain.  Marland Kitchen KRILL OIL PO Take by mouth.  . lithium carbonate (LITHOBID) 300 MG CR tablet Take 1 tablet (300 mg total) by mouth 2 (two) times daily.  Marland Kitchen LORazepam (ATIVAN) 2 MG tablet Take half tablet a night for anxiety and sleep.  Marland Kitchen sertraline (ZOLOFT) 100 MG tablet TAKE 2 TABLETS (200 MG TOTAL) BY MOUTH DAILY.  . [DISCONTINUED] lithium carbonate (LITHOBID) 300 MG CR tablet Take 1 tablet (300 mg total) by mouth 2 (two) times daily.  . [DISCONTINUED] LORazepam (ATIVAN) 2 MG tablet Take half tablet a night for anxiety and sleep.  . [DISCONTINUED] sertraline (ZOLOFT) 100 MG tablet TAKE 2 TABLETS (200 MG TOTAL) BY MOUTH DAILY.  . cyclobenzaprine (FLEXERIL) 10 MG tablet TAKE 1 TABLET (10 MG TOTAL) BY MOUTH 3 (THREE) TIMES DAILY AS NEEDED FOR MUSCLE SPASMS. (Patient not taking: Reported on 11/09/2015)  . glucosamine-chondroitin 500-400 MG tablet Take 1 tablet by mouth 2 (two) times daily.  . haloperidol (HALDOL) 2 MG  tablet Take 1 tablet (2 mg total) by mouth at bedtime and may repeat dose one time if needed.   No facility-administered encounter medications on file as of 11/09/2015.    No results found for this or any previous visit (from the past 2160 hour(s)).  BP 132/74 mmHg  Pulse 92  Ht 5\' 5"  (1.651 m)  Wt 210 lb (95.255 kg)  BMI 34.95 kg/m2  SpO2 97%   Review of Systems  Constitutional: Negative for fever.  Cardiovascular: Negative for chest pain.  Musculoskeletal: Positive for joint pain.  Skin: Negative for itching and rash.   Neurological: Negative for headaches.  Psychiatric/Behavioral: Positive for depression. Negative for suicidal ideas and substance abuse.    Mental Status Examination  Appearance: casual Alert: Yes Attention: fair  Cooperative: Yes Eye Contact: Good Speech: normal Psychomotor Activity: Decreased Memory/Concentration: adequate Oriented: person, place, time/date and situation Mood: somewhat distressed. Affect: Congruent Thought Processes and Associations: Coherent but endorses they are watching and keeping a tap on her  Fund of Knowledge: Fair Thought Content: Suicidal ideation and Homicidal ideation were denied Insight: Fair Judgement: Fair  Diagnosis: Bipolar disorder type I manic per history for psychotic features. Insomnia NOS. Adjustment disorder with anxiety . EPS Treatment Plan:  Bipolar: on lithium but will restart back haldol . She wants to get back on 2mg  . Reviewed side effects or tremors if it happens and call or visit early.  Continue Zoloft 200 mg   Lithium 600 mg for mood symptoms and bipolar.   Insomnia and anxiety:  Ativan 2mg  (half tablet at night) 30 tablets last for 2 months.  Refer to Primary care regard hypothyroid.  Shoulder pain: has added stress. Would recommend therapy to deal and cope with it. Pending injection.  She is a smoker, not reqdy to quit, counselling done.  Medication Side effects, benefits and risks reviewed/discussed with Patient. Time given for patient to respond and asks questions regarding the Diagnosis and Medications. Safety concerns and to report to ER if suicidal or call 911.  Discussed weight maintenance and Sleep Hygiene.   Greater than 50% of time was spend in counseling and coordination of care with the patient.  Schedule for Follow up visit in 1 month  or call in earlier as necessary. Time spent: 25 minutes   [name], MD 11/09/2015

## 2015-11-12 ENCOUNTER — Encounter: Payer: Self-pay | Admitting: Physician Assistant

## 2015-11-12 ENCOUNTER — Ambulatory Visit (INDEPENDENT_AMBULATORY_CARE_PROVIDER_SITE_OTHER): Payer: Medicare Other | Admitting: Physician Assistant

## 2015-11-12 VITALS — BP 139/82 | HR 113 | Temp 99.1°F | Wt 204.0 lb

## 2015-11-12 DIAGNOSIS — R197 Diarrhea, unspecified: Secondary | ICD-10-CM

## 2015-11-12 LAB — CBC WITH DIFFERENTIAL/PLATELET
BASOS PCT: 0 %
Basophils Absolute: 0 cells/uL (ref 0–200)
EOS ABS: 360 {cells}/uL (ref 15–500)
Eosinophils Relative: 2 %
HEMATOCRIT: 44.8 % (ref 35.0–45.0)
Hemoglobin: 15.5 g/dL (ref 11.7–15.5)
LYMPHS PCT: 20 %
Lymphs Abs: 3600 cells/uL (ref 850–3900)
MCH: 33.7 pg — ABNORMAL HIGH (ref 27.0–33.0)
MCHC: 34.6 g/dL (ref 32.0–36.0)
MCV: 97.4 fL (ref 80.0–100.0)
MONO ABS: 1080 {cells}/uL — AB (ref 200–950)
MPV: 9.9 fL (ref 7.5–12.5)
Monocytes Relative: 6 %
NEUTROS PCT: 72 %
Neutro Abs: 12960 cells/uL — ABNORMAL HIGH (ref 1500–7800)
Platelets: 550 10*3/uL — ABNORMAL HIGH (ref 140–400)
RBC: 4.6 MIL/uL (ref 3.80–5.10)
RDW: 12.9 % (ref 11.0–15.0)
WBC: 18 10*3/uL — ABNORMAL HIGH (ref 3.8–10.8)

## 2015-11-12 MED ORDER — ONDANSETRON HCL 4 MG PO TABS: 4.0000 mg | ORAL_TABLET | Freq: Three times a day (TID) | ORAL | Status: DC | PRN

## 2015-11-12 MED ORDER — DIPHENOXYLATE-ATROPINE 2.5-0.025 MG PO TABS: 1.0000 | ORAL_TABLET | Freq: Four times a day (QID) | ORAL | Status: DC | PRN

## 2015-11-12 NOTE — Progress Notes (Signed)
   Subjective:    Patient ID: Amber Rice, female    DOB: Mar 19, 1956, 60 y.o.   MRN: [id number]  HPI  Pt  Is a 60 yo female who presents to the clinic with diarrhea for 6 days. She denies any abdominal pain, melena, or hematochezia. On average she has been going 4-6 times a day with watery yellow to brown stool. No start of new medication, no recent abx or travel. No chills. Low grade fever of 99-100. This morning she has had no diarrhea and feeling better. She has lost from 215-204 in one week. She has not tried anything to make better. No sick contacts.   Review of Systems See HPI.    Objective:   Physical Exam  Constitutional: She is oriented to person, place, and time. She appears well-developed and well-nourished.  HENT:  Head: Normocephalic and atraumatic.  Cardiovascular: Normal rate, regular rhythm and normal heart sounds.   Pulmonary/Chest: Effort normal and breath sounds normal.  No CVA tenderness.   Abdominal: Soft. Bowel sounds are normal. She exhibits no distension and no mass. There is no tenderness. There is no rebound and no guarding.  Neurological: She is alert and oriented to person, place, and time.  Skin: Skin is dry.  Psychiatric: She has a normal mood and affect. Her behavior is normal.          Assessment & Plan:  Diarrhea- it seems pt's symptoms are consisent with viral gastroenteritis. Due to duration ordered stool culture and c.diff with CBC and CMP. Consider BRAT diet. Stay hydrated. zofran given for nausea. Lomotil if diarrhea persist.   Important to note with CBC pt had 4 steriod injections 2 days prior

## 2015-11-12 NOTE — Patient Instructions (Signed)

## 2015-11-17 ENCOUNTER — Telehealth: Payer: Self-pay | Admitting: *Deleted

## 2015-11-17 DIAGNOSIS — D72829 Elevated white blood cell count, unspecified: Secondary | ICD-10-CM | POA: Diagnosis not present

## 2015-11-18 DIAGNOSIS — G8929 Other chronic pain: Secondary | ICD-10-CM | POA: Diagnosis not present

## 2015-11-18 DIAGNOSIS — M19012 Primary osteoarthritis, left shoulder: Secondary | ICD-10-CM | POA: Diagnosis not present

## 2015-11-18 LAB — CBC WITH DIFFERENTIAL/PLATELET
BASOS ABS: 147 {cells}/uL (ref 0–200)
Basophils Relative: 1 %
EOS PCT: 3 %
Eosinophils Absolute: 441 cells/uL (ref 15–500)
HCT: 41.7 % (ref 35.0–45.0)
Hemoglobin: 14.2 g/dL (ref 11.7–15.5)
Lymphocytes Relative: 24 %
Lymphs Abs: 3528 cells/uL (ref 850–3900)
MCH: 33.4 pg — AB (ref 27.0–33.0)
MCHC: 34.1 g/dL (ref 32.0–36.0)
MCV: 98.1 fL (ref 80.0–100.0)
MONOS PCT: 7 %
MPV: 10 fL (ref 7.5–12.5)
Monocytes Absolute: 1029 cells/uL — ABNORMAL HIGH (ref 200–950)
NEUTROS ABS: 9555 {cells}/uL — AB (ref 1500–7800)
NEUTROS PCT: 65 %
PLATELETS: 480 10*3/uL — AB (ref 140–400)
RBC: 4.25 MIL/uL (ref 3.80–5.10)
RDW: 12.8 % (ref 11.0–15.0)
WBC: 14.7 10*3/uL — ABNORMAL HIGH (ref 3.8–10.8)

## 2015-11-29 ENCOUNTER — Telehealth (HOSPITAL_COMMUNITY): Payer: Self-pay | Admitting: Psychiatry

## 2015-11-29 ENCOUNTER — Ambulatory Visit (INDEPENDENT_AMBULATORY_CARE_PROVIDER_SITE_OTHER): Payer: Medicare Other | Admitting: Licensed Clinical Social Worker

## 2015-11-29 DIAGNOSIS — G8929 Other chronic pain: Secondary | ICD-10-CM

## 2015-11-29 DIAGNOSIS — F315 Bipolar disorder, current episode depressed, severe, with psychotic features: Secondary | ICD-10-CM

## 2015-11-29 NOTE — Telephone Encounter (Signed)
Pt express concerns about increasing Haldol 2mg  to 3mg . Pt states she is experiencing hallusions, hearing voices, and more paranoid since she went off Haldol for a few months. Pt states since she started Haldol 2mg , she symptoms are getting better, but she is experiencing more paranoia and would lie to increase medication. Pt would like to speak with Dr. [name] about increasing medication.  Please call to advise. Informed pt if symptoms are worsen, contact the office for an earlier appt or proceed to the local emergency room. Pt is schedule for a f/u appt on 12/02/15. Pt verbalizes understanding.

## 2015-11-29 NOTE — Progress Notes (Signed)
THERAPIST PROGRESS NOTE  Session Time: 2:50pm-3:30pm  Participation Level: Active  Behavioral Response: Disheveled Alert Paranoid  Type of Therapy: Individual Therapy  Treatment Goals addressed: depression  Interventions: Solution focused  Suicidal/Homicidal Risk: Denied both  Therapist Interventions:  Gathered information about significant events and changes in mood and functioning since last seen in February. Explored an increase in paranoid thinking and hallucinations.  Discussed how she has been coping despite having these experiences.  Encouraged patient to be honest with family members about symptoms she is struggling with.    Summary:Came in saying "I'm a mess."  Admitted that she stopped taking Haldol a couple months ago.  During that time she has had ongoing thoughts that she is being followed, that people are using technology to listen to her, that her home is under surveillance, and she is receiving messages on her phone.  Therapist asked about the phone messages.  She said she got rid of them.  Also expressed a belief that someone had poisoned a sandwich she ate from [employer] which left her feeling sick for about a week.  Therapist asked if these experiences scared her.  Patient said, "I'm more pissed off than scared.  I don't understand why they are doing this.  I'm just a boring old woman."   While she indicated that she believes these experiences to be real, she acknowledged that the Haldol seems to help her to cope more effectively.  Noted experiencing paranoia on and off since 2009.   While she reports frequently being in contact with her family she does not let them know about her struggles.  Expects they will not believe her and she "doesn't want to worry them."      Did not follow through with checking out the local senior center.  Continues to go to [employer] though.             Plan: Has scheduled an appointment later this week with the psychiatrist to discuss  possibility of increasing the dosage of her medication.   Diagnosis: Bipolar I Disorder, current episode depressed, with psychotic features                     Chronic pain  Rica Records, LCSW 11/29/2015

## 2015-11-30 DIAGNOSIS — F329 Major depressive disorder, single episode, unspecified: Secondary | ICD-10-CM | POA: Diagnosis not present

## 2015-11-30 DIAGNOSIS — M47816 Spondylosis without myelopathy or radiculopathy, lumbar region: Secondary | ICD-10-CM | POA: Diagnosis not present

## 2015-11-30 DIAGNOSIS — G8929 Other chronic pain: Secondary | ICD-10-CM | POA: Diagnosis not present

## 2015-11-30 DIAGNOSIS — Z79899 Other long term (current) drug therapy: Secondary | ICD-10-CM | POA: Diagnosis not present

## 2015-11-30 DIAGNOSIS — M19012 Primary osteoarthritis, left shoulder: Secondary | ICD-10-CM | POA: Diagnosis not present

## 2015-11-30 NOTE — Telephone Encounter (Signed)
Can increase haldol to 3mg . Will discuss at visit.

## 2015-11-30 NOTE — Telephone Encounter (Signed)
Return telephone call to pt. Per Dr. [name], please informed pt she may increase Haldol to 3mg . Pt express no concerns. Informed pt is symptoms worsen, pt will need to proceed to local emergency room or contact office for an earlier appt.

## 2015-12-02 ENCOUNTER — Encounter (HOSPITAL_COMMUNITY): Payer: Self-pay | Admitting: Psychiatry

## 2015-12-02 ENCOUNTER — Ambulatory Visit (INDEPENDENT_AMBULATORY_CARE_PROVIDER_SITE_OTHER): Payer: Medicare Other | Admitting: Psychiatry

## 2015-12-02 DIAGNOSIS — G8929 Other chronic pain: Secondary | ICD-10-CM | POA: Diagnosis not present

## 2015-12-02 DIAGNOSIS — F315 Bipolar disorder, current episode depressed, severe, with psychotic features: Secondary | ICD-10-CM | POA: Diagnosis not present

## 2015-12-02 DIAGNOSIS — F419 Anxiety disorder, unspecified: Secondary | ICD-10-CM | POA: Diagnosis not present

## 2015-12-02 DIAGNOSIS — G259 Extrapyramidal and movement disorder, unspecified: Secondary | ICD-10-CM

## 2015-12-02 MED ORDER — HALOPERIDOL 1 MG PO TABS: 3.0000 mg | ORAL_TABLET | Freq: Every evening | ORAL | Status: DC | PRN

## 2015-12-02 MED ORDER — LITHIUM CARBONATE ER 300 MG PO TBCR: 300.0000 mg | EXTENDED_RELEASE_TABLET | Freq: Two times a day (BID) | ORAL | Status: DC

## 2015-12-02 MED ORDER — SERTRALINE HCL 100 MG PO TABS: ORAL_TABLET | ORAL | Status: DC

## 2015-12-02 NOTE — Progress Notes (Signed)
Patient ID: Amber Rice, female   DOB: 07-13-1956, 60 y.o.   MRN: [id number] [place] [id number] 60 y.o.  12/02/2015  Chief Complaint: Follow up bipolar disorder    History of Present Illness:   Patient returns for Medication Follow up and is diagnosed with bipolar disorder depressed. Anxiety disorder NOS. Insomnia   Last visit we cut down Haldol 2 mg because some concern about tremors. She called back and express recurrence of paranoia but not the point of his tormentors watching her. She felt uncomfortable night and had to go out of her home to look around because of paranoia. She started back on 3 mg recently some less paranoia now. Tremors not worsened.   She's not drinking alcohol or using drugs. She remains somewhat stressed about her sister's physical health and now her's shoulder surgery pending.  No other involuntary movement  She feels taking Ativan helps her anxiety and tremors. Mostly takes at nigh  She feels more distress due to her back and shoulder condition. Duration of bipolar more than for 7  years  Depression scale is 5/10.  Modyifiyng factors. meds and going out to walk. Aggravating factors: poor sleep and finances. medcial conditions and pain. Her sisters health. Shoulder pain and replacement needed.  TSH is being monitored by another provider.   She also has D. degenerative joint disease she is currently on pain medications takes when necessary.     Past Medical History  Diagnosis Date  . Hyperlipidemia   . Degenerative disc disease, lumbar     diagnosed in 2005   Family History  Problem Relation Age of Onset  . Cancer Mother   . Heart attack Father   . Hyperlipidemia Father   . Cancer Maternal Grandmother   . Cancer Paternal Grandmother   . Bipolar disorder Paternal Uncle     Committed suicide in his 26's  . Dementia Paternal Uncle   . Depression Cousin   . Alcohol abuse Neg Hx   .  Anxiety disorder Neg Hx   . Schizophrenia Cousin   . Multiple sclerosis Sister     Outpatient Encounter Prescriptions as of 12/02/2015  Medication Sig  . diphenoxylate-atropine (LOMOTIL) 2.5-0.025 MG tablet Take 1 tablet by mouth 4 (four) times daily as needed for diarrhea or loose stools.  . haloperidol (HALDOL) 1 MG tablet Take 3 tablets (3 mg total) by mouth at bedtime and may repeat dose one time if needed.  Marland Kitchen HYDROcodone-acetaminophen (NORCO/VICODIN) 5-325 MG tablet Take 1 tablet by mouth every 8 (eight) hours as needed for moderate pain.  Marland Kitchen lithium carbonate (LITHOBID) 300 MG CR tablet Take 1 tablet (300 mg total) by mouth 2 (two) times daily.  Marland Kitchen LORazepam (ATIVAN) 2 MG tablet Take half tablet a night for anxiety and sleep.  Marland Kitchen ondansetron (ZOFRAN) 4 MG tablet Take 1 tablet (4 mg total) by mouth every 8 (eight) hours as needed for nausea or vomiting.  . sertraline (ZOLOFT) 100 MG tablet TAKE 2 TABLETS (200 MG TOTAL) BY MOUTH DAILY.  . [DISCONTINUED] haloperidol (HALDOL) 2 MG tablet Take 1 tablet (2 mg total) by mouth at bedtime and may repeat dose one time if needed.  . [DISCONTINUED] lithium carbonate (LITHOBID) 300 MG CR tablet Take 1 tablet (300 mg total) by mouth 2 (two) times daily.  . [DISCONTINUED] sertraline (ZOLOFT) 100 MG tablet TAKE 2 TABLETS (200 MG TOTAL) BY MOUTH DAILY.   No facility-administered encounter medications on file as of 12/02/2015.  Recent Results (from the past 2160 hour(s))  CBC with Differential/Platelet     Status: Abnormal   Collection Time: 11/12/15 10:22 AM  Result Value Ref Range   WBC 18.0 (H) 3.8 - 10.8 K/uL   RBC 4.60 3.80 - 5.10 MIL/uL   Hemoglobin 15.5 11.7 - 15.5 g/dL   HCT 44.8 35.0 - 45.0 %   MCV 97.4 80.0 - 100.0 fL   MCH 33.7 (H) 27.0 - 33.0 pg   MCHC 34.6 32.0 - 36.0 g/dL   RDW 12.9 11.0 - 15.0 %   Platelets 550 (H) 140 - 400 K/uL   MPV 9.9 7.5 - 12.5 fL   Neutro Abs 12960 (H) 1500 - 7800 cells/uL   Lymphs Abs 3600 850 - 3900  cells/uL   Monocytes Absolute 1080 (H) 200 - 950 cells/uL   Eosinophils Absolute 360 15 - 500 cells/uL   Basophils Absolute 0 0 - 200 cells/uL   Neutrophils Relative % 72 %   Lymphocytes Relative 20 %   Monocytes Relative 6 %   Eosinophils Relative 2 %   Basophils Relative 0 %   Smear Review SEE NOTE     Comment: Atypical lymphs. ** Please note change in unit of measure and reference range(s). **   CBC with Differential     Status: Abnormal   Collection Time: 11/17/15  2:28 PM  Result Value Ref Range   WBC 14.7 (H) 3.8 - 10.8 K/uL   RBC 4.25 3.80 - 5.10 MIL/uL   Hemoglobin 14.2 11.7 - 15.5 g/dL   HCT 41.7 35.0 - 45.0 %   MCV 98.1 80.0 - 100.0 fL   MCH 33.4 (H) 27.0 - 33.0 pg   MCHC 34.1 32.0 - 36.0 g/dL   RDW 12.8 11.0 - 15.0 %   Platelets 480 (H) 140 - 400 K/uL   MPV 10.0 7.5 - 12.5 fL   Neutro Abs 9555 (H) 1500 - 7800 cells/uL   Lymphs Abs 3528 850 - 3900 cells/uL   Monocytes Absolute 1029 (H) 200 - 950 cells/uL   Eosinophils Absolute 441 15 - 500 cells/uL   Basophils Absolute 147 0 - 200 cells/uL   Neutrophils Relative % 65 %   Lymphocytes Relative 24 %   Monocytes Relative 7 %   Eosinophils Relative 3 %   Basophils Relative 1 %   Smear Review SEE NOTE     Comment: Atypical lymphs. ** Please note change in unit of measure and reference range(s). **     There were no vitals taken for this visit.   Review of Systems  Constitutional: Negative for fever.  Cardiovascular: Negative for chest pain.  Musculoskeletal: Positive for joint pain.  Skin: Negative for itching and rash.  Neurological: Positive for tremors. Negative for headaches.  Psychiatric/Behavioral: Negative for suicidal ideas and substance abuse. The patient is nervous/anxious.     Mental Status Examination  Appearance: casual Alert: Yes Attention: fair  Cooperative: Yes Eye Contact: Good Speech: normal Psychomotor Activity: Decreased Memory/Concentration: adequate Oriented: person, place,  time/date and situation Mood: somewhat distressed. Affect: Congruent Thought Processes and Associations: Coherent but endorses they are watching and keeping a tap on her  Fund of Knowledge: Fair Thought Content: Suicidal ideation and Homicidal ideation were denied Insight: Fair Judgement: Fair  Diagnosis: Bipolar disorder type I manic per history for psychotic features. Insomnia NOS. Adjustment disorder with anxiety . EPS Treatment Plan:  Bipolar: on lithium but will continue 3mg  instead of 2mg  . Refill givne.   Continue  Zoloft 200 mg   Lithium 600 mg for mood symptoms and bipolar.  Can call back for more refills since it was filled recently Paranoia better on 3mg  and improving.  Insomnia and anxiety:  Ativan 2mg  (half tablet at night) 30 tablets last for 2 months.  Refer to Primary care regard hypothyroid.  Shoulder pain: has added stress. Would recommend therapy to deal and cope with it. Pending injection.  She is a smoker, not reqdy to quit, counselling done.  Medication Side effects, benefits and risks reviewed/discussed with Patient. Time given for patient to respond and asks questions regarding the Diagnosis and Medications. Safety concerns and to report to ER if suicidal or call 911.  Discussed weight maintenance and Sleep Hygiene.   Greater than 50% of time was spend in counseling and coordination of care with the patient.  Schedule for Follow up visit in 1 month  or call in earlier as necessary. Time spent: 25 minutes   [name], MD 12/02/2015

## 2015-12-06 ENCOUNTER — Ambulatory Visit (HOSPITAL_COMMUNITY): Payer: Self-pay | Admitting: Psychiatry

## 2015-12-20 DIAGNOSIS — M25512 Pain in left shoulder: Secondary | ICD-10-CM | POA: Diagnosis not present

## 2015-12-20 DIAGNOSIS — M129 Arthropathy, unspecified: Secondary | ICD-10-CM | POA: Diagnosis not present

## 2015-12-23 DIAGNOSIS — M47816 Spondylosis without myelopathy or radiculopathy, lumbar region: Secondary | ICD-10-CM | POA: Diagnosis not present

## 2015-12-23 DIAGNOSIS — G8929 Other chronic pain: Secondary | ICD-10-CM | POA: Diagnosis not present

## 2016-01-06 HISTORY — PX: JOINT REPLACEMENT: SHX530

## 2016-01-11 DIAGNOSIS — M47816 Spondylosis without myelopathy or radiculopathy, lumbar region: Secondary | ICD-10-CM | POA: Diagnosis not present

## 2016-01-11 DIAGNOSIS — G8929 Other chronic pain: Secondary | ICD-10-CM | POA: Diagnosis not present

## 2016-01-11 DIAGNOSIS — M19012 Primary osteoarthritis, left shoulder: Secondary | ICD-10-CM | POA: Diagnosis not present

## 2016-01-11 DIAGNOSIS — Z79891 Long term (current) use of opiate analgesic: Secondary | ICD-10-CM | POA: Diagnosis not present

## 2016-01-11 DIAGNOSIS — F329 Major depressive disorder, single episode, unspecified: Secondary | ICD-10-CM | POA: Diagnosis not present

## 2016-01-13 ENCOUNTER — Encounter (HOSPITAL_COMMUNITY): Payer: Self-pay | Admitting: Psychiatry

## 2016-01-13 ENCOUNTER — Ambulatory Visit (INDEPENDENT_AMBULATORY_CARE_PROVIDER_SITE_OTHER): Payer: Medicare Other | Admitting: Psychiatry

## 2016-01-13 VITALS — BP 140/82 | HR 96 | Ht 65.0 in | Wt 210.0 lb

## 2016-01-13 DIAGNOSIS — F419 Anxiety disorder, unspecified: Secondary | ICD-10-CM

## 2016-01-13 DIAGNOSIS — F315 Bipolar disorder, current episode depressed, severe, with psychotic features: Secondary | ICD-10-CM | POA: Diagnosis not present

## 2016-01-13 DIAGNOSIS — G259 Extrapyramidal and movement disorder, unspecified: Secondary | ICD-10-CM | POA: Diagnosis not present

## 2016-01-13 DIAGNOSIS — G8929 Other chronic pain: Secondary | ICD-10-CM

## 2016-01-13 MED ORDER — SERTRALINE HCL 100 MG PO TABS: ORAL_TABLET | ORAL | Status: DC

## 2016-01-13 MED ORDER — LITHIUM CARBONATE ER 300 MG PO TBCR: 300.0000 mg | EXTENDED_RELEASE_TABLET | Freq: Two times a day (BID) | ORAL | Status: DC

## 2016-01-13 MED ORDER — HALOPERIDOL 2 MG PO TABS: 4.0000 mg | ORAL_TABLET | Freq: Every evening | ORAL | Status: DC | PRN

## 2016-01-13 MED ORDER — LORAZEPAM 2 MG PO TABS: ORAL_TABLET | ORAL | Status: DC

## 2016-01-13 NOTE — Progress Notes (Signed)
Patient ID: Amber Rice, female   DOB: 1955/12/12, 60 y.o.   MRN: [id number] [place] [id number] 60 y.o.  01/13/2016  Chief Complaint: Follow up bipolar disorder   History of Present Illness:   Patient returns for Medication Follow up and is diagnosed with bipolar disorder depressed. Anxiety disorder NOS. Insomnia  Last visit we increased the Haldol to 3 mg that has helped the voices but she still feels they are following her or they can track her phone calls. She can distract these kind of thoughts but on the further note she is more aware of other shoulder surgery which is due next week. .no increase in tremors. Has good support from her sister.  She's not drinking alcohol or using drugs. Her providers are aware of her medications. She willhave pre op blood work up She remains somewhat stressed about her sister's physical health and now her's shoulder surgery pending.  No other involuntary movement  She feels taking Ativan helps her anxiety and tremors. Mostly takes at nigh  She feels more distress due to her back and shoulder condition. Duration of bipolar more than for 7  years  Depression scale is 5/10.  Modyifiyng factors. meds and going out to walk. Aggravating factors: poor sleep and finances. medcial conditions and pain. Her sisters health. Shoulder pain and replacement needed.  TSH is being monitored by another provider.   She also has D. degenerative joint disease she is currently on pain medications takes when necessary.     Past Medical History  Diagnosis Date  . Hyperlipidemia   . Degenerative disc disease, lumbar     diagnosed in 2005   Family History  Problem Relation Age of Onset  . Cancer Mother   . Heart attack Father   . Hyperlipidemia Father   . Cancer Maternal Grandmother   . Cancer Paternal Grandmother   . Bipolar disorder Paternal Uncle     Committed suicide in his 83's  . Dementia Paternal  Uncle   . Depression Cousin   . Alcohol abuse Neg Hx   . Anxiety disorder Neg Hx   . Schizophrenia Cousin   . Multiple sclerosis Sister     Outpatient Encounter Prescriptions as of 01/13/2016  Medication Sig  . diphenoxylate-atropine (LOMOTIL) 2.5-0.025 MG tablet Take 1 tablet by mouth 4 (four) times daily as needed for diarrhea or loose stools.  . haloperidol (HALDOL) 2 MG tablet Take 2 tablets (4 mg total) by mouth at bedtime and may repeat dose one time if needed. Take total dose of 4mg  a day  . HYDROcodone-acetaminophen (NORCO/VICODIN) 5-325 MG tablet Take 1 tablet by mouth every 8 (eight) hours as needed for moderate pain.  Marland Kitchen HYDROcodone-acetaminophen (NORCO/VICODIN) 5-325 MG tablet Take by mouth.  . lithium carbonate (LITHOBID) 300 MG CR tablet Take 1 tablet (300 mg total) by mouth 2 (two) times daily.  Marland Kitchen LORazepam (ATIVAN) 2 MG tablet Take half tablet a night for anxiety and sleep.  Marland Kitchen sertraline (ZOLOFT) 100 MG tablet TAKE 2 TABLETS (200 MG TOTAL) BY MOUTH DAILY.  . [DISCONTINUED] haloperidol (HALDOL) 1 MG tablet Take 3 tablets (3 mg total) by mouth at bedtime and may repeat dose one time if needed.  . [DISCONTINUED] lithium carbonate (LITHOBID) 300 MG CR tablet Take 1 tablet (300 mg total) by mouth 2 (two) times daily.  . [DISCONTINUED] LORazepam (ATIVAN) 2 MG tablet Take half tablet a night for anxiety and sleep.  . [DISCONTINUED] sertraline (ZOLOFT) 100  MG tablet TAKE 2 TABLETS (200 MG TOTAL) BY MOUTH DAILY.  Marland Kitchen ondansetron (ZOFRAN) 4 MG tablet Take 1 tablet (4 mg total) by mouth every 8 (eight) hours as needed for nausea or vomiting. (Patient not taking: Reported on 01/13/2016)   No facility-administered encounter medications on file as of 01/13/2016.    Recent Results (from the past 2160 hour(s))  CBC with Differential/Platelet     Status: Abnormal   Collection Time: 11/12/15 10:22 AM  Result Value Ref Range   WBC 18.0 (H) 3.8 - 10.8 K/uL   RBC 4.60 3.80 - 5.10 MIL/uL    Hemoglobin 15.5 11.7 - 15.5 g/dL   HCT 44.8 35.0 - 45.0 %   MCV 97.4 80.0 - 100.0 fL   MCH 33.7 (H) 27.0 - 33.0 pg   MCHC 34.6 32.0 - 36.0 g/dL   RDW 12.9 11.0 - 15.0 %   Platelets 550 (H) 140 - 400 K/uL   MPV 9.9 7.5 - 12.5 fL   Neutro Abs 12960 (H) 1500 - 7800 cells/uL   Lymphs Abs 3600 850 - 3900 cells/uL   Monocytes Absolute 1080 (H) 200 - 950 cells/uL   Eosinophils Absolute 360 15 - 500 cells/uL   Basophils Absolute 0 0 - 200 cells/uL   Neutrophils Relative % 72 %   Lymphocytes Relative 20 %   Monocytes Relative 6 %   Eosinophils Relative 2 %   Basophils Relative 0 %   Smear Review SEE NOTE     Comment: Atypical lymphs. ** Please note change in unit of measure and reference range(s). **   CBC with Differential     Status: Abnormal   Collection Time: 11/17/15  2:28 PM  Result Value Ref Range   WBC 14.7 (H) 3.8 - 10.8 K/uL   RBC 4.25 3.80 - 5.10 MIL/uL   Hemoglobin 14.2 11.7 - 15.5 g/dL   HCT 41.7 35.0 - 45.0 %   MCV 98.1 80.0 - 100.0 fL   MCH 33.4 (H) 27.0 - 33.0 pg   MCHC 34.1 32.0 - 36.0 g/dL   RDW 12.8 11.0 - 15.0 %   Platelets 480 (H) 140 - 400 K/uL   MPV 10.0 7.5 - 12.5 fL   Neutro Abs 9555 (H) 1500 - 7800 cells/uL   Lymphs Abs 3528 850 - 3900 cells/uL   Monocytes Absolute 1029 (H) 200 - 950 cells/uL   Eosinophils Absolute 441 15 - 500 cells/uL   Basophils Absolute 147 0 - 200 cells/uL   Neutrophils Relative % 65 %   Lymphocytes Relative 24 %   Monocytes Relative 7 %   Eosinophils Relative 3 %   Basophils Relative 1 %   Smear Review SEE NOTE     Comment: Atypical lymphs. ** Please note change in unit of measure and reference range(s). **     BP 140/82 mmHg  Pulse 96  Ht 5\' 5"  (1.651 m)  Wt 210 lb (95.255 kg)  BMI 34.95 kg/m2  SpO2 97%   Review of Systems  Constitutional: Negative for fever.  Cardiovascular: Negative for chest pain.  Musculoskeletal: Positive for joint pain.  Skin: Negative for itching and rash.  Neurological: Positive for  tremors. Negative for headaches.  Psychiatric/Behavioral: Negative for suicidal ideas and substance abuse.    Mental Status Examination  Appearance: casual Alert: Yes Attention: fair  Cooperative: Yes Eye Contact: Good Speech: normal Psychomotor Activity: Decreased Memory/Concentration: adequate Oriented: person, place, time/date and situation Mood: somewhat distressed. Affect: Congruent Thought Processes and Associations: Coherent  but endorses they are watching and keeping a tap on her  Fund of Knowledge: Fair Thought Content: Suicidal ideation and Homicidal ideation were denied Insight: Fair Judgement: Fair  Diagnosis: Bipolar disorder type I manic per history for psychotic features. Insomnia NOS. Adjustment disorder with anxiety . EPS Treatment Plan:  Bipolar: on lithium but will increase to 4mg  for the paranoia . Refill givne.  Continue Zoloft 200 mg   Lithium 600 mg for mood symptoms and bipolar.  adivsed to get blood work up by primary care or part of pre op . Can call back for more refills since it was filled recently Paranoia: will increase haldol to 4mg .  Insomnia and anxiety:  Ativan 2mg  (half tablet at night) 30 tablets last for 2 months.  Refer to Primary care regard hypothyroid.  Shoulder pain: has added stress. Surgery pending She is a smoker, not reqdy to quit, counselling done.  Medication Side effects, benefits and risks reviewed/discussed with Patient. Time given for patient to respond and asks questions regarding the Diagnosis and Medications. Safety concerns and to report to ER if suicidal or call 911.  Discussed weight maintenance and Sleep Hygiene.   Greater than 50% of time was spend in counseling and coordination of care with the patient.  Schedule for Follow up visit in 1 month  or call in earlier as necessary. Time spent: 25 minutes   [name], MD 01/13/2016

## 2016-01-14 ENCOUNTER — Encounter: Payer: Self-pay | Admitting: Physician Assistant

## 2016-01-14 ENCOUNTER — Ambulatory Visit (INDEPENDENT_AMBULATORY_CARE_PROVIDER_SITE_OTHER): Payer: Medicare Other | Admitting: Physician Assistant

## 2016-01-14 VITALS — BP 131/78 | HR 100 | Ht 65.0 in | Wt 209.0 lb

## 2016-01-14 DIAGNOSIS — Z01818 Encounter for other preprocedural examination: Secondary | ICD-10-CM | POA: Diagnosis not present

## 2016-01-14 DIAGNOSIS — Z01810 Encounter for preprocedural cardiovascular examination: Secondary | ICD-10-CM | POA: Diagnosis not present

## 2016-01-14 DIAGNOSIS — E039 Hypothyroidism, unspecified: Secondary | ICD-10-CM

## 2016-01-14 DIAGNOSIS — Z79899 Other long term (current) drug therapy: Secondary | ICD-10-CM

## 2016-01-14 DIAGNOSIS — D72829 Elevated white blood cell count, unspecified: Secondary | ICD-10-CM | POA: Diagnosis not present

## 2016-01-14 DIAGNOSIS — M19012 Primary osteoarthritis, left shoulder: Secondary | ICD-10-CM | POA: Diagnosis not present

## 2016-01-14 NOTE — Progress Notes (Signed)
   Subjective:    Patient ID: Amber Rice, female    DOB: [birth year], 60 y.o.   MRN: [id number]  HPI  Pt presents to the clinic for clearance for left shoulder total arthroplasty replacement on June 15th, 2017. She was seen by [facility] [place] today for surgical clearance with cbc, UA, CMP, and EKG. Her WBC remains elevated at 16. She has no signs of symptoms of infection today. Infact she just finished PCN on June 3rd for tooth infection and removal. Denies fever, chills, n/v/d, cough, abdominal pain, dysuria, flank pain, sinus pressure, ear pain or ST. Denies any recent steriod injections.     Review of Systems  All other systems reviewed and are negative.      Objective:   Physical Exam  Constitutional: She is oriented to person, place, and time. She appears well-developed and well-nourished.  HENT:  Head: Normocephalic and atraumatic.  Right Ear: External ear normal.  Left Ear: External ear normal.  Nose: Nose normal.  Mouth/Throat: Oropharynx is clear and moist. No oropharyngeal exudate.  Eyes: Conjunctivae are normal. Right eye exhibits no discharge. Left eye exhibits no discharge.  Neck: Normal range of motion. Neck supple.  Cardiovascular: Normal rate, regular rhythm and normal heart sounds.   Pulmonary/Chest: Effort normal and breath sounds normal. She has no wheezes.  Abdominal: Soft. Bowel sounds are normal. She exhibits no distension and no mass. There is no tenderness. There is no rebound and no guarding.  Lymphadenopathy:    She has no cervical adenopathy.  Neurological: She is alert and oriented to person, place, and time.  Skin: Skin is dry.  Psychiatric: She has a normal mood and affect. Her behavior is normal.          Assessment & Plan:  Presurgical clearance/leukocytosis- CBC/CMP/UA done today with EKG. No signs of infection except elevated WBC. I had suspected that elevated WBC was due to steroid injection however she has not had any in weeks. Recently  finished abx for tooth infection. No signs of infection on exam. UA was clear today at ortho. Perhaps culture will show infection if present. After surgery make follow up appt to evaluate elevated white count for other causes.   Medication management- will check lithum level via [place].   Hypothyroidism- will recheck TSH and adjust accordingly.

## 2016-01-20 DIAGNOSIS — E119 Type 2 diabetes mellitus without complications: Secondary | ICD-10-CM | POA: Diagnosis present

## 2016-01-20 DIAGNOSIS — Z79891 Long term (current) use of opiate analgesic: Secondary | ICD-10-CM | POA: Diagnosis not present

## 2016-01-20 DIAGNOSIS — Z96612 Presence of left artificial shoulder joint: Secondary | ICD-10-CM | POA: Diagnosis not present

## 2016-01-20 DIAGNOSIS — Z888 Allergy status to other drugs, medicaments and biological substances status: Secondary | ICD-10-CM | POA: Diagnosis not present

## 2016-01-20 DIAGNOSIS — Z79899 Other long term (current) drug therapy: Secondary | ICD-10-CM | POA: Diagnosis not present

## 2016-01-20 DIAGNOSIS — Z6837 Body mass index (BMI) 37.0-37.9, adult: Secondary | ICD-10-CM | POA: Diagnosis not present

## 2016-01-20 DIAGNOSIS — M7542 Impingement syndrome of left shoulder: Secondary | ICD-10-CM | POA: Diagnosis not present

## 2016-01-20 DIAGNOSIS — M19012 Primary osteoarthritis, left shoulder: Secondary | ICD-10-CM | POA: Diagnosis not present

## 2016-01-20 DIAGNOSIS — F319 Bipolar disorder, unspecified: Secondary | ICD-10-CM | POA: Diagnosis present

## 2016-01-20 DIAGNOSIS — M7522 Bicipital tendinitis, left shoulder: Secondary | ICD-10-CM | POA: Diagnosis present

## 2016-01-20 DIAGNOSIS — E669 Obesity, unspecified: Secondary | ICD-10-CM | POA: Diagnosis present

## 2016-01-20 DIAGNOSIS — Z471 Aftercare following joint replacement surgery: Secondary | ICD-10-CM | POA: Diagnosis not present

## 2016-01-20 DIAGNOSIS — F17200 Nicotine dependence, unspecified, uncomplicated: Secondary | ICD-10-CM | POA: Diagnosis present

## 2016-01-20 DIAGNOSIS — M25512 Pain in left shoulder: Secondary | ICD-10-CM | POA: Diagnosis not present

## 2016-01-20 DIAGNOSIS — F419 Anxiety disorder, unspecified: Secondary | ICD-10-CM | POA: Diagnosis present

## 2016-01-20 DIAGNOSIS — E785 Hyperlipidemia, unspecified: Secondary | ICD-10-CM | POA: Diagnosis present

## 2016-01-20 DIAGNOSIS — Z791 Long term (current) use of non-steroidal anti-inflammatories (NSAID): Secondary | ICD-10-CM | POA: Diagnosis not present

## 2016-01-27 ENCOUNTER — Ambulatory Visit (HOSPITAL_COMMUNITY): Payer: Self-pay | Admitting: Psychiatry

## 2016-02-02 DIAGNOSIS — M25512 Pain in left shoulder: Secondary | ICD-10-CM | POA: Diagnosis not present

## 2016-02-15 ENCOUNTER — Other Ambulatory Visit (HOSPITAL_COMMUNITY): Payer: Self-pay | Admitting: Psychiatry

## 2016-02-21 NOTE — Telephone Encounter (Signed)
Received fax from [facility] requesting refill for Zoloft 10mg . Per Dr. [name], refill request is denied. Medication refill was escribed to pharmacy on 01/13/16 for Zoloft 100mg , #60 w/ 1 refill. Pt has request refill too early. Pt is schedule for a f/u appt on 8/9. Called and informed pt of rx status. Pt shows understanding.

## 2016-03-08 ENCOUNTER — Ambulatory Visit (HOSPITAL_COMMUNITY): Payer: Self-pay | Admitting: Psychiatry

## 2016-03-08 ENCOUNTER — Other Ambulatory Visit: Payer: Self-pay | Admitting: Family Medicine

## 2016-03-08 DIAGNOSIS — Z1231 Encounter for screening mammogram for malignant neoplasm of breast: Secondary | ICD-10-CM

## 2016-03-14 ENCOUNTER — Other Ambulatory Visit (HOSPITAL_COMMUNITY): Payer: Self-pay | Admitting: Psychiatry

## 2016-03-15 ENCOUNTER — Ambulatory Visit (INDEPENDENT_AMBULATORY_CARE_PROVIDER_SITE_OTHER): Payer: Medicare Other | Admitting: Psychiatry

## 2016-03-15 ENCOUNTER — Encounter (HOSPITAL_COMMUNITY): Payer: Self-pay | Admitting: Psychiatry

## 2016-03-15 VITALS — BP 126/74 | HR 99 | Ht 65.0 in | Wt 206.0 lb

## 2016-03-15 DIAGNOSIS — G8929 Other chronic pain: Secondary | ICD-10-CM | POA: Diagnosis not present

## 2016-03-15 DIAGNOSIS — G259 Extrapyramidal and movement disorder, unspecified: Secondary | ICD-10-CM | POA: Diagnosis not present

## 2016-03-15 DIAGNOSIS — F315 Bipolar disorder, current episode depressed, severe, with psychotic features: Secondary | ICD-10-CM

## 2016-03-15 DIAGNOSIS — F419 Anxiety disorder, unspecified: Secondary | ICD-10-CM

## 2016-03-15 MED ORDER — SERTRALINE HCL 100 MG PO TABS: ORAL_TABLET | ORAL | 1 refills | Status: DC

## 2016-03-15 MED ORDER — LORAZEPAM 2 MG PO TABS: ORAL_TABLET | ORAL | 0 refills | Status: DC

## 2016-03-15 MED ORDER — LITHIUM CARBONATE ER 300 MG PO TBCR: 300.0000 mg | EXTENDED_RELEASE_TABLET | Freq: Two times a day (BID) | ORAL | 1 refills | Status: DC

## 2016-03-15 MED ORDER — HALOPERIDOL 2 MG PO TABS: 4.0000 mg | ORAL_TABLET | Freq: Every evening | ORAL | 1 refills | Status: DC | PRN

## 2016-03-15 NOTE — Progress Notes (Signed)
Patient ID: Amber Rice, female   DOB: Jan 19, 1956, 60 y.o.   MRN: [id number] [place] [id number] 60 y.o.  03/15/2016  Chief Complaint: Follow up bipolar disorder   History of Present Illness:   Patient returns for Medication Follow up and is diagnosed with bipolar disorder depressed. Anxiety disorder NOS. Insomnia  Last visit she was endorsing some paranoia and increased the Haldol to 4 mg that has helped. She has had a shoulder [place] now in her left shoulder for pain is reduced she's not taking Vicodin on a regular basis she is happy about the surgery and the recovery process .no increase in tremors. Has good support from her sister.  She's not drinking alcohol or using drugs. Her providers are aware of her medications.  No other involuntary movement  She feels taking Ativan helps her anxiety and tremors. Mostly takes at night 30 tabs last for 2 months.   Duration of bipolar more than for 7  years  Depression scale is 6/10.  Better  Modyifiyng factors. meds and going out to walk. Aggravating factors: poor sleep and finances. medcial conditions and pain. Her sisters health. Shoulder pain and replacement needed.  TSH is being monitored by another provider.   She also has D. degenerative joint disease she is currently on pain medications takes when necessary.     Past Medical History:  Diagnosis Date  . Degenerative disc disease, lumbar    diagnosed in 2005  . Hyperlipidemia    Family History  Problem Relation Age of Onset  . Cancer Mother   . Heart attack Father   . Hyperlipidemia Father   . Cancer Maternal Grandmother   . Cancer Paternal Grandmother   . Bipolar disorder Paternal Uncle     Committed suicide in his 29's  . Dementia Paternal Uncle   . Depression Cousin   . Schizophrenia Cousin   . Multiple sclerosis Sister   . Alcohol abuse Neg Hx   . Anxiety disorder Neg Hx      Outpatient Encounter Prescriptions as of 03/15/2016  Medication Sig  . haloperidol (HALDOL) 2 MG tablet Take 2 tablets (4 mg total) by mouth at bedtime and may repeat dose one time if needed. Take total dose of 4mg  a day  . HYDROcodone-acetaminophen (NORCO/VICODIN) 5-325 MG tablet Take 1 tablet by mouth every 8 (eight) hours as needed for moderate pain.  Marland Kitchen lithium carbonate (LITHOBID) 300 MG CR tablet Take 1 tablet (300 mg total) by mouth 2 (two) times daily.  Marland Kitchen LORazepam (ATIVAN) 2 MG tablet Take half tablet a night for anxiety and sleep.  Marland Kitchen sertraline (ZOLOFT) 100 MG tablet TAKE 2 TABLETS (200 MG TOTAL) BY MOUTH DAILY.  . [DISCONTINUED] haloperidol (HALDOL) 2 MG tablet Take 2 tablets (4 mg total) by mouth at bedtime and may repeat dose one time if needed. Take total dose of 4mg  a day  . [DISCONTINUED] lithium carbonate (LITHOBID) 300 MG CR tablet Take 1 tablet (300 mg total) by mouth 2 (two) times daily.  . [DISCONTINUED] LORazepam (ATIVAN) 2 MG tablet Take half tablet a night for anxiety and sleep.  . [DISCONTINUED] sertraline (ZOLOFT) 100 MG tablet TAKE 2 TABLETS (200 MG TOTAL) BY MOUTH DAILY.   No facility-administered encounter medications on file as of 03/15/2016.     No results found for this or any previous visit (from the past 2160 hour(s)).  BP 126/74 (BP Location: Left Arm, Patient Position: Sitting, Cuff Size:  Normal)   Pulse 99   Ht 5\' 5"  (1.651 m)   Wt 206 lb (93.4 kg)   SpO2 97%   BMI 34.28 kg/m    Review of Systems  Constitutional: Negative for fever.  Cardiovascular: Negative for chest pain.  Musculoskeletal: Negative for joint pain.  Skin: Negative for itching and rash.  Neurological: Positive for tremors. Negative for headaches.  Psychiatric/Behavioral: Negative for depression, substance abuse and suicidal ideas.    Mental Status Examination  Appearance: casual Alert: Yes Attention: fair  Cooperative: Yes Eye Contact: Good Speech: normal Psychomotor  Activity: Decreased Memory/Concentration: adequate Oriented: person, place, time/date and situation Mood: less distressed since surgery done Affect: Congruent Thought Processes and Associations: Coherent but endorses they are watching and keeping a tap on her  Fund of Knowledge: Fair Thought Content: Suicidal ideation and Homicidal ideation were denied Insight: Fair Judgement: Fair  Diagnosis: Bipolar disorder type I manic per history for psychotic features. Insomnia NOS. Adjustment disorder with anxiety . EPS Treatment Plan:  Bipolar: on lithium and haldol .   Continue Zoloft 200 mg   Lithium 600 mg for mood symptoms and bipolar.    Paranoia: improved on 4mg  haldol. Insomnia and anxiety:  Ativan 2mg  (half tablet at night) 30 tablets last for 2 months.  Refer to Primary care regard hypothyroid.  Shoulder pain: has added stress. Surgery pending She is a smoker, not reqdy to quit, counselling done.  Medication Side effects, benefits and risks reviewed/discussed with Patient. Time given for patient to respond and asks questions regarding the Diagnosis and Medications. Safety concerns and to report to ER if suicidal or call 911.  Discussed weight maintenance and Sleep Hygiene.   Greater than 50% of time was spend in counseling and coordination of care with the patient.  Schedule for Follow up visit in 1 month  or call in earlier as necessary. Time spent: 25 minutes   [name], MD

## 2016-03-22 ENCOUNTER — Ambulatory Visit (INDEPENDENT_AMBULATORY_CARE_PROVIDER_SITE_OTHER): Payer: Medicare Other

## 2016-03-22 DIAGNOSIS — M25512 Pain in left shoulder: Secondary | ICD-10-CM | POA: Diagnosis not present

## 2016-03-22 DIAGNOSIS — Z1231 Encounter for screening mammogram for malignant neoplasm of breast: Secondary | ICD-10-CM | POA: Diagnosis not present

## 2016-03-22 DIAGNOSIS — Z96612 Presence of left artificial shoulder joint: Secondary | ICD-10-CM | POA: Diagnosis not present

## 2016-03-27 DIAGNOSIS — Z79899 Other long term (current) drug therapy: Secondary | ICD-10-CM | POA: Diagnosis not present

## 2016-03-27 DIAGNOSIS — E039 Hypothyroidism, unspecified: Secondary | ICD-10-CM | POA: Diagnosis not present

## 2016-03-28 LAB — TSH: TSH: 4.01 m[IU]/L

## 2016-03-28 LAB — LITHIUM LEVEL: LITHIUM LVL: 0.8 mmol/L (ref 0.6–1.2)

## 2016-04-18 DIAGNOSIS — M47816 Spondylosis without myelopathy or radiculopathy, lumbar region: Secondary | ICD-10-CM | POA: Diagnosis not present

## 2016-04-18 DIAGNOSIS — G8929 Other chronic pain: Secondary | ICD-10-CM | POA: Diagnosis not present

## 2016-04-18 DIAGNOSIS — M19012 Primary osteoarthritis, left shoulder: Secondary | ICD-10-CM | POA: Diagnosis not present

## 2016-04-18 DIAGNOSIS — Z79891 Long term (current) use of opiate analgesic: Secondary | ICD-10-CM | POA: Diagnosis not present

## 2016-05-08 ENCOUNTER — Ambulatory Visit (INDEPENDENT_AMBULATORY_CARE_PROVIDER_SITE_OTHER): Payer: Medicare Other | Admitting: Physician Assistant

## 2016-05-08 ENCOUNTER — Encounter: Payer: Self-pay | Admitting: Physician Assistant

## 2016-05-08 VITALS — BP 149/74 | HR 115 | Ht 65.0 in | Wt 201.0 lb

## 2016-05-08 DIAGNOSIS — L84 Corns and callosities: Secondary | ICD-10-CM | POA: Insufficient documentation

## 2016-05-08 MED ORDER — SALICYLIC ACID 6 % EX LOTN: TOPICAL_LOTION | CUTANEOUS | 0 refills | Status: DC

## 2016-05-08 NOTE — Progress Notes (Addendum)
Subjective:     Patient ID: Amber Rice, female   DOB: [birth year], 60 y.o.   MRN: [id number]  HPI The patient is a 60 y.o. Caucasian female presenting today with complaints of a left elbow lesion. The patient notes that she has not recently injured her elbow but that she often rests it on the counter. She denies any rash, itchiness, warmth, or tenderness. The patient states that she has been applying topical lotions but without any relief. The patient notes that is has been there for several weeks without any resolution. The patient denies fever, shortness of breath, or chest pain.  Review of Systems  Constitutional: Negative for activity change, appetite change, chills, diaphoresis, fatigue, fever and unexpected weight change.  HENT: Negative.   Eyes: Negative.   Respiratory: Negative for cough, choking, chest tightness, shortness of breath and wheezing.   Cardiovascular: Negative for chest pain, palpitations and leg swelling.  Gastrointestinal: Negative.   Endocrine: Negative.   Genitourinary: Negative.   Musculoskeletal: Negative.   Skin: Negative.   Neurological: Negative.   Psychiatric/Behavioral: Negative.        Objective:   Physical Exam  Constitutional: She is oriented to person, place, and time. She appears well-developed and well-nourished. No distress.  HENT:  Head: Normocephalic and atraumatic.  Eyes: Conjunctivae and EOM are normal. Pupils are equal, round, and reactive to light. Right eye exhibits no discharge. Left eye exhibits no discharge. No scleral icterus.  Neck: Normal range of motion. Neck supple. No JVD present. No tracheal deviation present. No thyromegaly present.  Cardiovascular: Normal rate, regular rhythm, normal heart sounds and intact distal pulses.  Exam reveals no gallop and no friction rub.   No murmur heard. Pulmonary/Chest: Effort normal and breath sounds normal. No stridor. No respiratory distress. She has no wheezes. She has no rales. She  exhibits no tenderness.  Lymphadenopathy:    She has no cervical adenopathy.  Neurological: She is alert and oriented to person, place, and time. No cranial nerve deficit. Coordination normal.  Skin: Skin is warm and dry. No rash noted. She is not diaphoretic. No erythema. No pallor.  Scaly, nonerythematous lesion approximately 1 x 1 cm located on dorsal aspect of bony prominence of the olecranon. No tenderness, warmth, drainage, or fasciculation.  Psychiatric: She has a normal mood and affect. Her behavior is normal. Judgment and thought content normal.      Assessment:     Diagnoses and all orders for this visit:  Callus  Other orders -     Salicylic Acid 6 % LOTN; Apply once a day to the callus on her left elbow.      Plan:     1. Callus - Discussed with patient that her symptoms are mostly due to callus formation. Patient instructed to use Salicyclic acid 6% lotion one daily on her left elbow then cover it duct tape. Patient to follow- up as needed if symptoms do not improve or worsen.

## 2016-05-08 NOTE — Patient Instructions (Signed)
Salicylic Acid topical gel, cream, lotion, solution What is this medicine? SALICYCLIC ACID (SAL i SIL ik AS id) breaks down layers of thick skin. It is used to treat common and plantar warts, psoriasis, calluses, and corns. It is also used to treat or to prevent acne. This medicine may be used for other purposes; ask your health care provider or pharmacist if you have questions. What should I tell my health care provider before I take this medicine? They need to know if you have any of these conditions: -child with chickenpox, the flu, or other viral infection -kidney disease -liver disease -an unusual or allergic reaction to salicylic acid, other medicines, foods, dyes, or preservatives -pregnant or trying to get pregnant -breast-feeding How should I use this medicine? This medicine is for external use only. Follow the directions on the label. Do not apply to raw or irritated skin. Avoid getting medicine in your eyes, lips, nose, mouth, or other sensitive areas. Use this medicine at regular intervals. Do not use more often than directed. Talk to your pediatrician regarding the use of this medicine in children. Special care may be needed. This medicine is not approved for use in children under 11 years old. Overdosage: If you think you have taken too much of this medicine contact a poison control center or emergency room at once. NOTE: This medicine is only for you. Do not share this medicine with others. What if I miss a dose? If you miss a dose, use it as soon as you can. If it is almost time for your next dose, use only that dose. Do not use double or extra doses. What may interact with this medicine? -medicines that change urine pH like ammonium chloride, sodium bicarbonate, and others -medicines that treat or prevent blood clots like warfarin -methotrexate -pyrazinamide -some medicines for diabetes -some medicines for gout -steroid medicines like prednisone or cortisone This list may  not describe all possible interactions. Give your health care provider a list of all the medicines, herbs, non-prescription drugs, or dietary supplements you use. Also tell them if you smoke, drink alcohol, or use illegal drugs. Some items may interact with your medicine. What should I watch for while using this medicine? Tell your doctor is your symptoms do not get better or if they get worse. This medicine can make you more sensitive to the sun. Keep out of the sun. If you cannot avoid being in the sun, wear protective clothing and use sunscreen. Do not use sun lamps or tanning beds/booths. Use of this medicine in children under 12 years or in patients with kidney or liver disease may increase the risk of serious side effects. These patients should not use this medicine over large areas of skin. If you notice symptoms such as nausea, vomiting, dizziness, loss of hearing, ringing in the ears, unusual weakness or tiredness, fast or labored breathing, diarrhea, or confusion, stop using this medicine and contact your doctor or health care professional. What side effects may I notice from receiving this medicine? Side effects that you should report to your doctor or health care professional as soon as possible: -allergic reactions like skin rash, itching or hives, swelling of the face, lips, or tongue Side effects that usually do not require medical attention (report to your doctor or health care professional if they continue or are bothersome): -skin irritation This list may not describe all possible side effects. Call your doctor for medical advice about side effects. You may report side effects to  FDA at 1-800-FDA-1088. Where should I keep my medicine? Keep out of the reach of children. Store at room temperature between 15 and 30 degrees C (59 and 86 degrees F). Do not freeze. Throw away any unused medicine after the expiration date. NOTE: This sheet is a summary. It may not cover all possible  information. If you have questions about this medicine, talk to your doctor, pharmacist, or health care provider.    2016, [employer] Standard. (2008-03-27 13:36:20)

## 2016-05-09 ENCOUNTER — Ambulatory Visit (INDEPENDENT_AMBULATORY_CARE_PROVIDER_SITE_OTHER): Payer: Medicare Other | Admitting: Psychiatry

## 2016-05-09 ENCOUNTER — Encounter (HOSPITAL_COMMUNITY): Payer: Self-pay | Admitting: Psychiatry

## 2016-05-09 VITALS — BP 110/64 | HR 87 | Ht 65.0 in | Wt 200.0 lb

## 2016-05-09 DIAGNOSIS — F315 Bipolar disorder, current episode depressed, severe, with psychotic features: Secondary | ICD-10-CM

## 2016-05-09 DIAGNOSIS — G259 Extrapyramidal and movement disorder, unspecified: Secondary | ICD-10-CM | POA: Diagnosis not present

## 2016-05-09 MED ORDER — HALOPERIDOL 1 MG PO TABS: 3.0000 mg | ORAL_TABLET | Freq: Every evening | ORAL | 1 refills | Status: DC | PRN

## 2016-05-09 MED ORDER — LITHIUM CARBONATE ER 300 MG PO TBCR: 300.0000 mg | EXTENDED_RELEASE_TABLET | Freq: Two times a day (BID) | ORAL | 1 refills | Status: DC

## 2016-05-09 MED ORDER — LORAZEPAM 2 MG PO TABS: ORAL_TABLET | ORAL | 0 refills | Status: DC

## 2016-05-09 MED ORDER — SERTRALINE HCL 100 MG PO TABS: ORAL_TABLET | ORAL | 1 refills | Status: DC

## 2016-05-09 NOTE — Progress Notes (Signed)
Patient ID: Amber Rice, female   DOB: [birth year], 60 y.o.   MRN: [id number] [place] [id number] 60 y.o.  05/09/2016  Chief Complaint: Follow up bipolar disorder   History of Present Illness:   Patient returns for Medication Follow up and is diagnosed with bipolar disorder depressed. Anxiety disorder NOS. Insomnia  Patient is recovering from shoulder surgery she feels better more motivated. She wants to cut down the Haldol to 3 mg denies any paranoia. She's not drinking alcohol or using drugs. Her providers are aware of her medications.  No other involuntary movement  She feels taking Ativan helps her anxiety and tremors. Mostly takes at night 30 tabs last for 2 months.   Duration of bipolar more than for 7  years  Depression scale is 6/10.  Better  Modyifiyng factors. meds and going out to walk. Aggravating factors: poor sleep and finances. medcial conditions and pain. Her sisters health. Shoulder pain and replacement needed.  TSH is being monitored by another provider.   She also has D. degenerative joint disease she is currently on pain medications takes when necessary.     Past Medical History:  Diagnosis Date  . Degenerative disc disease, lumbar    diagnosed in 2005  . Hyperlipidemia    Family History  Problem Relation Age of Onset  . Cancer Mother   . Heart attack Father   . Hyperlipidemia Father   . Cancer Maternal Grandmother   . Cancer Paternal Grandmother   . Bipolar disorder Paternal Uncle     Committed suicide in his 29's  . Dementia Paternal Uncle   . Depression Cousin   . Schizophrenia Cousin   . Multiple sclerosis Sister   . Alcohol abuse Neg Hx   . Anxiety disorder Neg Hx     Outpatient Encounter Prescriptions as of 05/09/2016  Medication Sig  . haloperidol (HALDOL) 1 MG tablet Take 3 tablets (3 mg total) by mouth at bedtime and may repeat dose one time if needed.  Marland Kitchen  HYDROcodone-acetaminophen (NORCO/VICODIN) 5-325 MG tablet Take 1 tablet by mouth every 8 (eight) hours as needed for moderate pain.  Marland Kitchen lithium carbonate (LITHOBID) 300 MG CR tablet Take 1 tablet (300 mg total) by mouth 2 (two) times daily.  Marland Kitchen LORazepam (ATIVAN) 2 MG tablet Take half tablet a night for anxiety and sleep.  . Salicylic Acid 6 % LOTN Apply once a day to the callus on her left elbow.  . sertraline (ZOLOFT) 100 MG tablet TAKE 2 TABLETS (200 MG TOTAL) BY MOUTH DAILY.  . [DISCONTINUED] haloperidol (HALDOL) 2 MG tablet Take 2 tablets (4 mg total) by mouth at bedtime and may repeat dose one time if needed. Take total dose of 4mg  a day  . [DISCONTINUED] lithium carbonate (LITHOBID) 300 MG CR tablet Take 1 tablet (300 mg total) by mouth 2 (two) times daily.  . [DISCONTINUED] LORazepam (ATIVAN) 2 MG tablet Take half tablet a night for anxiety and sleep.  . [DISCONTINUED] sertraline (ZOLOFT) 100 MG tablet TAKE 2 TABLETS (200 MG TOTAL) BY MOUTH DAILY.   No facility-administered encounter medications on file as of 05/09/2016.     Recent Results (from the past 2160 hour(s))  Lithium level     Status: None   Collection Time: 03/27/16  3:32 PM  Result Value Ref Range   Lithium Lvl 0.8 0.6 - 1.2 mmol/L    Comment: ** Please note change in unit of measure and reference range(s). **  TSH     Status: None   Collection Time: 03/27/16  3:32 PM  Result Value Ref Range   TSH 4.01 mIU/L    Comment:   Reference Range   > or = 20 Years  0.40-4.50   Pregnancy Range First trimester  0.26-2.66 Second trimester 0.55-2.73 Third trimester  0.43-2.91       BP 110/64 (BP Location: Right Arm, Patient Position: Sitting, Cuff Size: Normal)   Pulse 87   Ht 5\' 5"  (1.651 m)   Wt 200 lb (90.7 kg)   SpO2 91%   BMI 33.28 kg/m    Review of Systems  Constitutional: Negative for fever.  Cardiovascular: Negative for palpitations.  Musculoskeletal: Negative for joint pain.  Skin: Negative for itching  and rash.  Neurological: Negative for headaches.  Psychiatric/Behavioral: Negative for depression, substance abuse and suicidal ideas.    Mental Status Examination  Appearance: casual Alert: Yes Attention: fair  Cooperative: Yes Eye Contact: Good Speech: normal Psychomotor Activity: Decreased Memory/Concentration: adequate Oriented: person, place, time/date and situation Mood: less distressed since surgery done Affect: Congruent Thought Processes and Associations: Coherent but endorses they are watching and keeping a tap on her  Fund of Knowledge: Fair Thought Content: Suicidal ideation and Homicidal ideation were denied Insight: Fair Judgement: Fair  Diagnosis: Bipolar disorder type I manic per history for psychotic features. Insomnia NOS. Adjustment disorder with anxiety . EPS Treatment Plan:  Bipolar: on lithium and haldol .   Continue Zoloft 200 mg   Lithium 600 mg for mood symptoms and bipolar.  Labs checked it was 0.8 TSH: normal   Paranoia: denies for now. Will cut down to 3mg  haldol.  Insomnia and anxiety:  Ativan 2mg  (half tablet at night) 30 tablets last for 2 months.  Refer to Primary care regard hypothyroid.  Shoulder pain: recovering   Medication Side effects, benefits and risks reviewed/discussed with Patient. Time given for patient to respond and asks questions regarding the Diagnosis and Medications. Safety concerns and to report to ER if suicidal or call 911.  Discussed weight maintenance and Sleep Hygiene.   Greater than 50% of time was spend in counseling and coordination of care with the patient.  Schedule for Follow up visit in 2 month  or call in earlier as necessary. Time spent: 25 minutes   [name], MD

## 2016-05-16 DIAGNOSIS — M19012 Primary osteoarthritis, left shoulder: Secondary | ICD-10-CM | POA: Diagnosis not present

## 2016-05-16 DIAGNOSIS — M47816 Spondylosis without myelopathy or radiculopathy, lumbar region: Secondary | ICD-10-CM | POA: Diagnosis not present

## 2016-05-16 DIAGNOSIS — Z79891 Long term (current) use of opiate analgesic: Secondary | ICD-10-CM | POA: Diagnosis not present

## 2016-05-16 DIAGNOSIS — G8929 Other chronic pain: Secondary | ICD-10-CM | POA: Diagnosis not present

## 2016-06-24 IMAGING — MG MM DIGITAL SCREENING
4 series · 4 of 4 positions shown · non-contrast
Comparison: None.

CLINICAL DATA: Screening.

EXAM:
DIGITAL SCREENING BILATERAL MAMMOGRAM WITH CAD

[R CC]
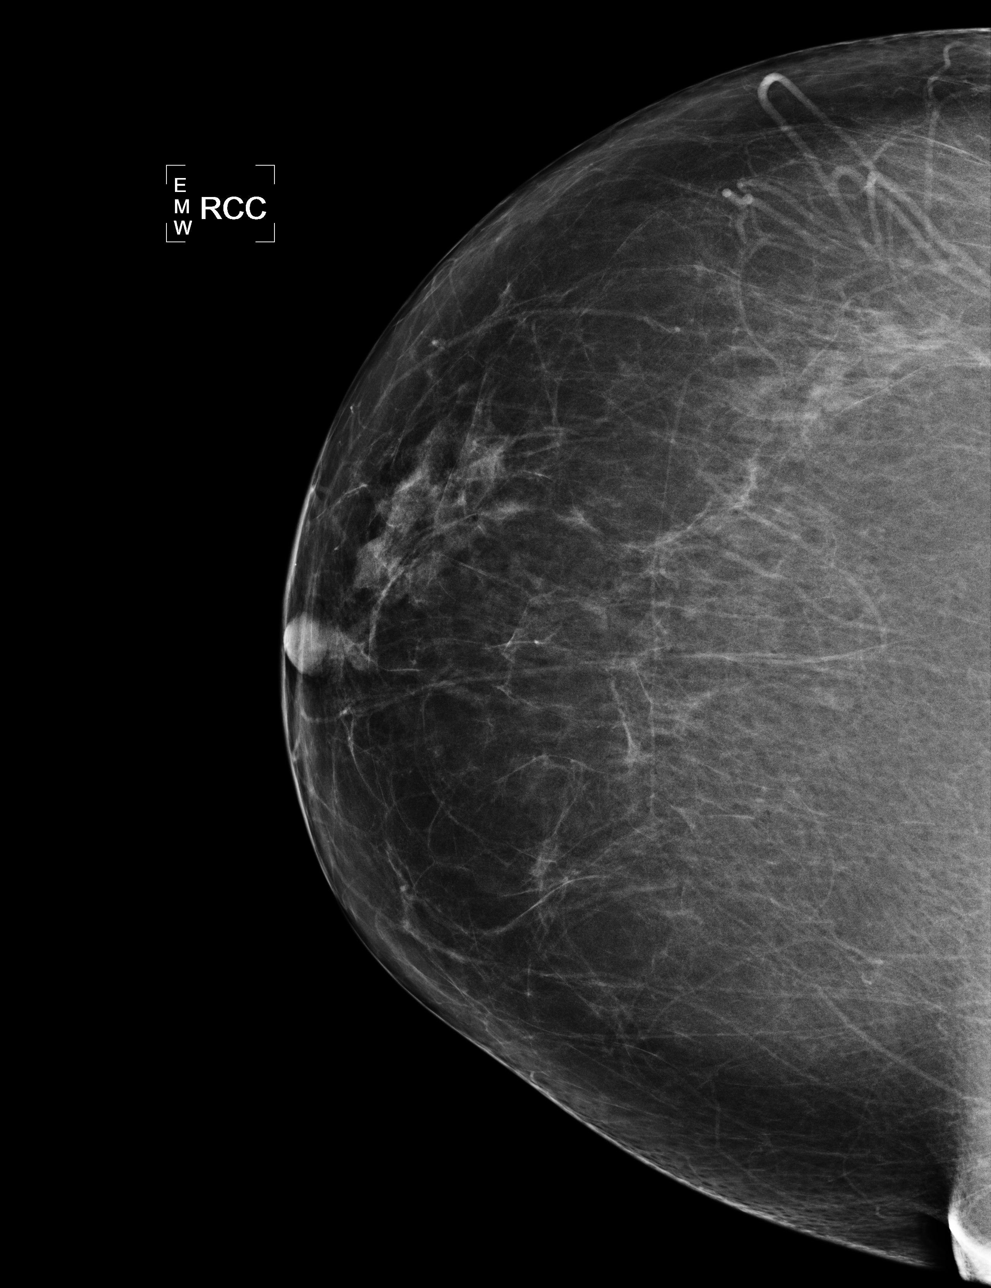

[L CC]
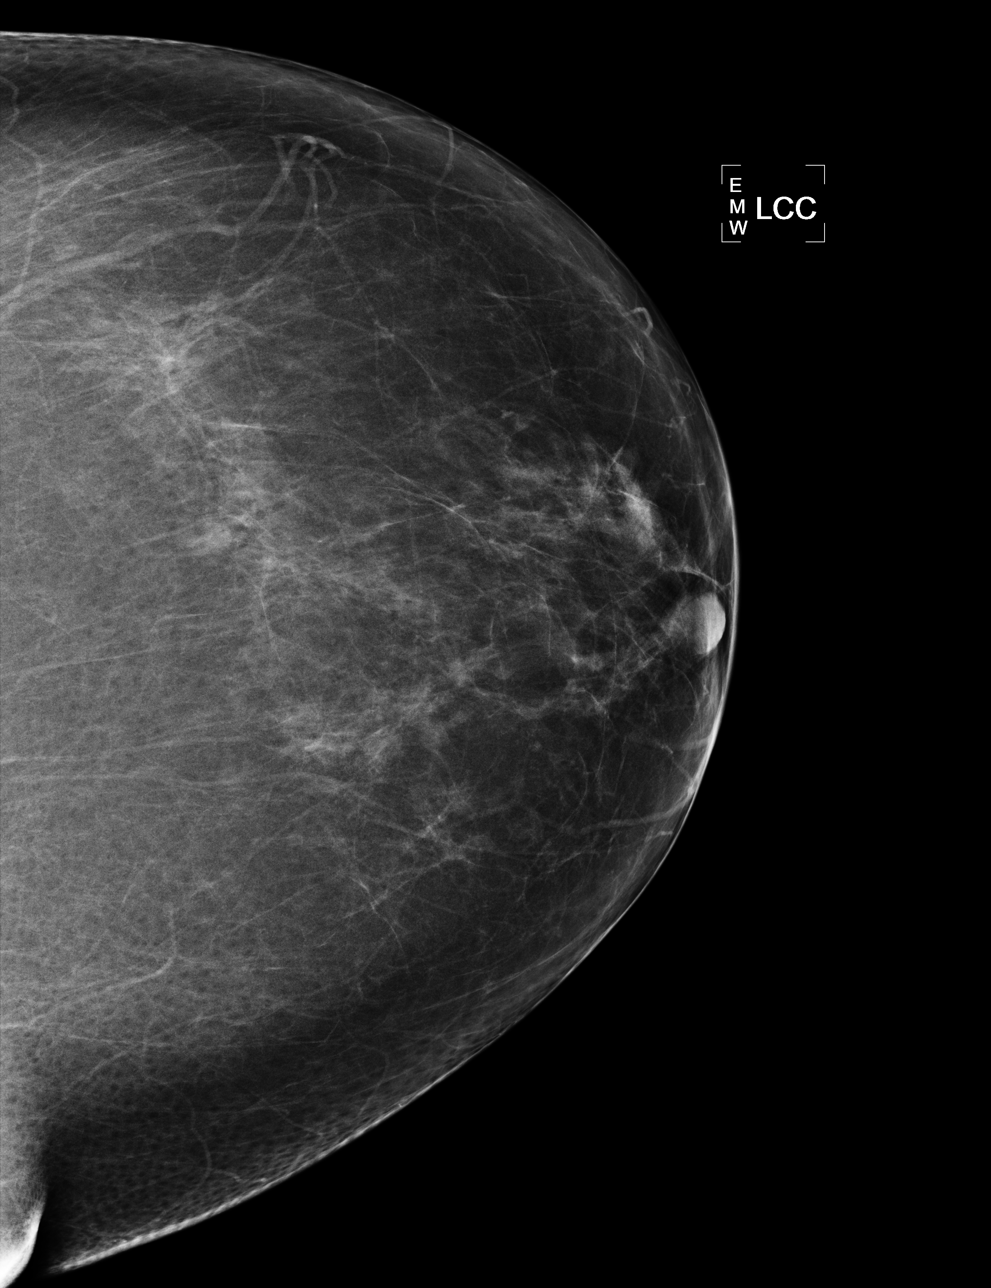

[L MLO]
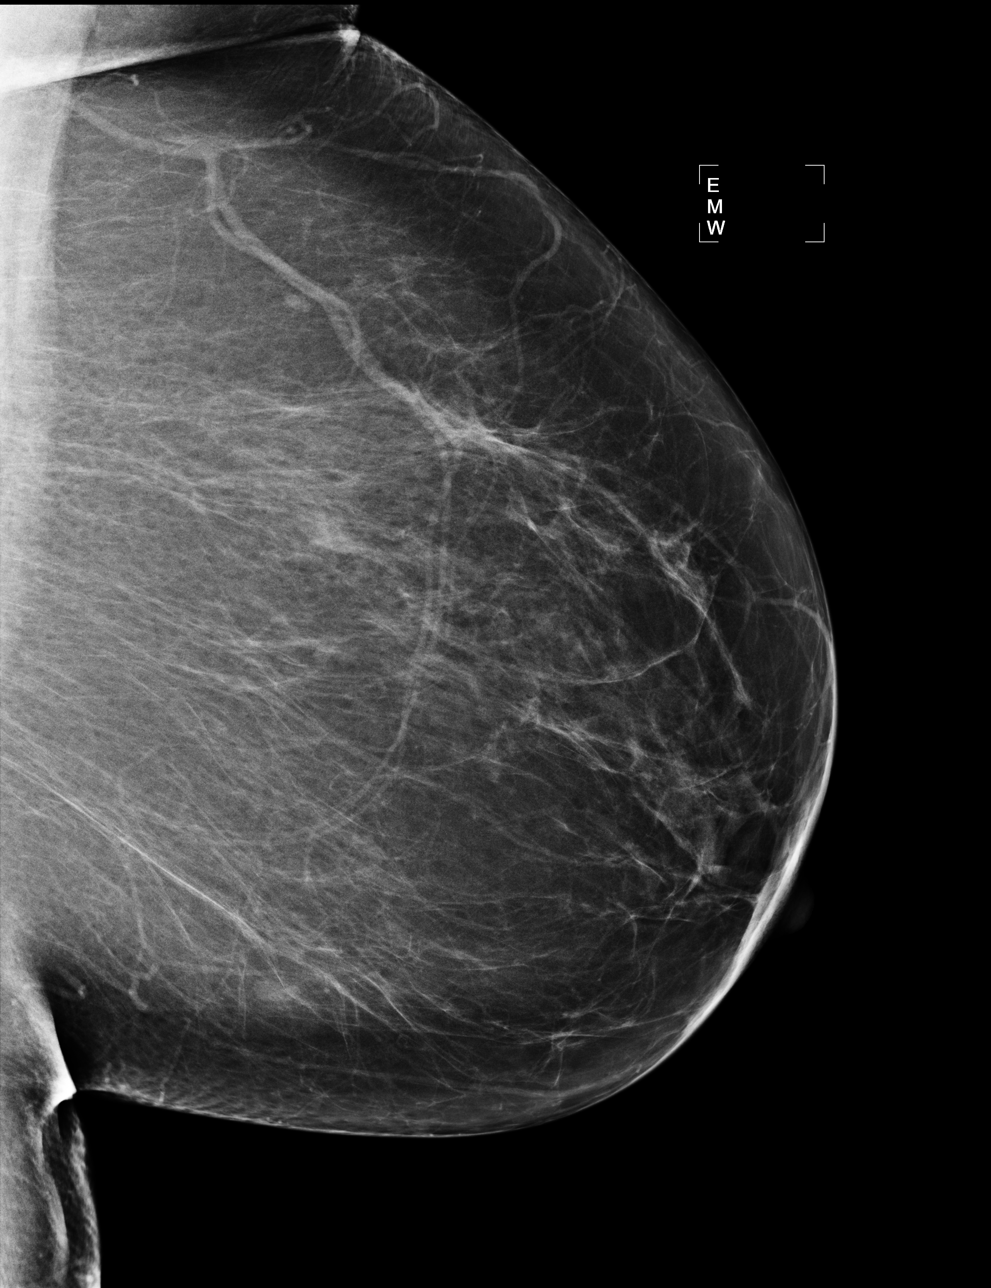

[R MLO]
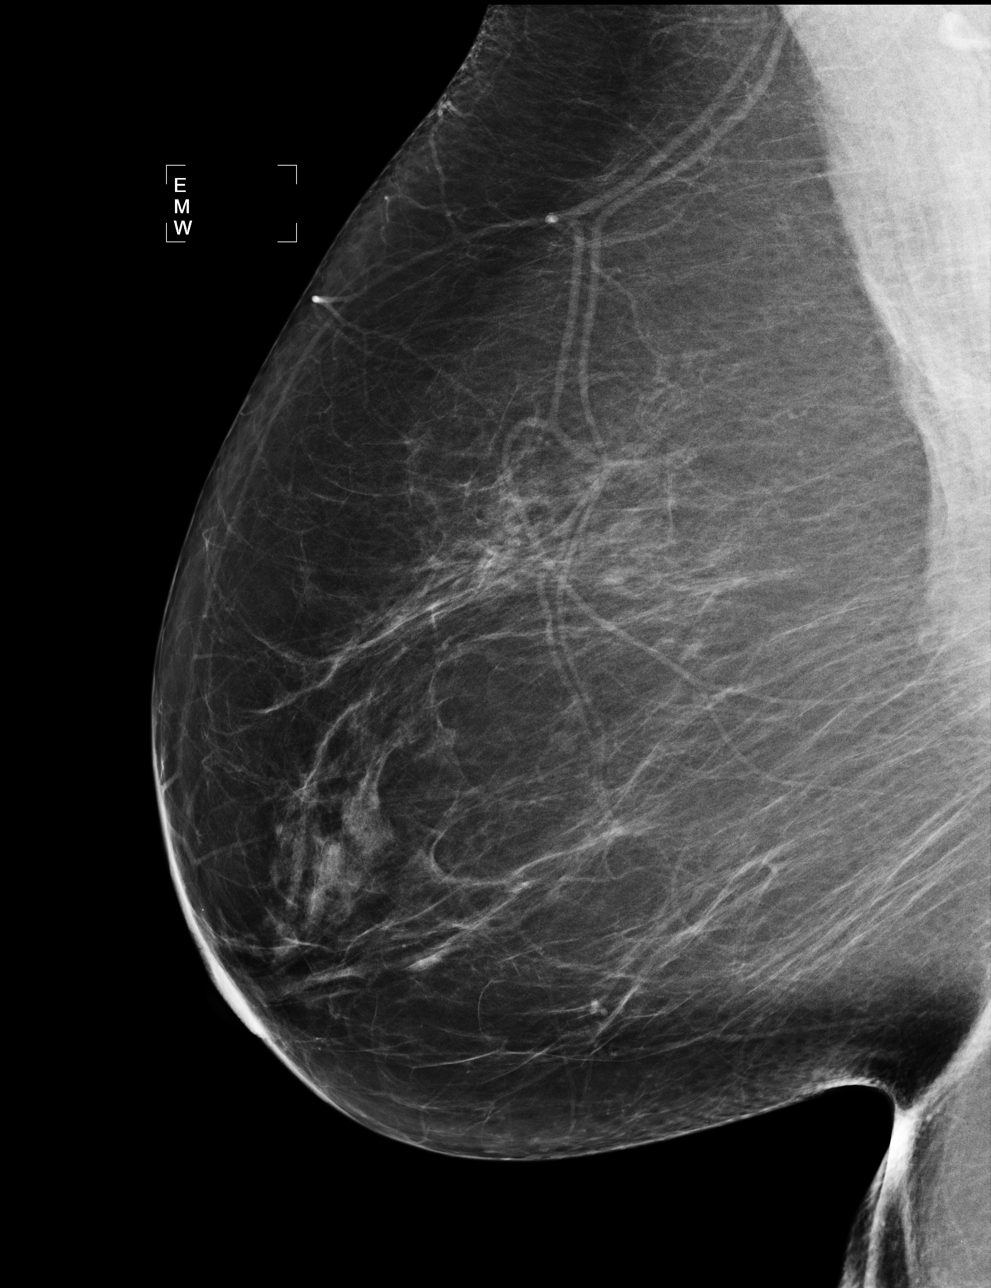

[4 of 4 positions shown; findings below may reference images not displayed]

ACR Breast Density Category b: There are scattered areas of
fibroglandular density.
FINDINGS: There are no findings suspicious for malignancy. Images were
processed with CAD.
IMPRESSION: No mammographic evidence of malignancy. A result letter of this
screening mammogram will be mailed directly to the patient.

RECOMMENDATION:
Screening mammogram in one year. (Code:[id number])

BI-RADS CATEGORY  1: Negative.

## 2016-07-10 ENCOUNTER — Ambulatory Visit (HOSPITAL_COMMUNITY): Payer: Self-pay | Admitting: Psychiatry

## 2016-07-12 ENCOUNTER — Encounter (HOSPITAL_COMMUNITY): Payer: Self-pay | Admitting: Psychiatry

## 2016-07-12 ENCOUNTER — Ambulatory Visit (INDEPENDENT_AMBULATORY_CARE_PROVIDER_SITE_OTHER): Payer: Medicare Other | Admitting: Psychiatry

## 2016-07-12 VITALS — BP 124/76 | HR 96 | Resp 16 | Ht 65.0 in | Wt 200.0 lb

## 2016-07-12 DIAGNOSIS — Z79899 Other long term (current) drug therapy: Secondary | ICD-10-CM

## 2016-07-12 DIAGNOSIS — F5102 Adjustment insomnia: Secondary | ICD-10-CM | POA: Diagnosis not present

## 2016-07-12 DIAGNOSIS — Z8249 Family history of ischemic heart disease and other diseases of the circulatory system: Secondary | ICD-10-CM | POA: Diagnosis not present

## 2016-07-12 DIAGNOSIS — Z818 Family history of other mental and behavioral disorders: Secondary | ICD-10-CM

## 2016-07-12 DIAGNOSIS — G259 Extrapyramidal and movement disorder, unspecified: Secondary | ICD-10-CM | POA: Diagnosis not present

## 2016-07-12 DIAGNOSIS — G8929 Other chronic pain: Secondary | ICD-10-CM

## 2016-07-12 DIAGNOSIS — F315 Bipolar disorder, current episode depressed, severe, with psychotic features: Secondary | ICD-10-CM

## 2016-07-12 DIAGNOSIS — Z808 Family history of malignant neoplasm of other organs or systems: Secondary | ICD-10-CM

## 2016-07-12 MED ORDER — LITHIUM CARBONATE ER 300 MG PO TBCR: 300.0000 mg | EXTENDED_RELEASE_TABLET | Freq: Two times a day (BID) | ORAL | 1 refills | Status: DC

## 2016-07-12 MED ORDER — SERTRALINE HCL 100 MG PO TABS: ORAL_TABLET | ORAL | 1 refills | Status: DC

## 2016-07-12 MED ORDER — HALOPERIDOL 2 MG PO TABS: 4.0000 mg | ORAL_TABLET | Freq: Every day | ORAL | 1 refills | Status: DC

## 2016-07-12 MED ORDER — LORAZEPAM 2 MG PO TABS: ORAL_TABLET | ORAL | 0 refills | Status: DC

## 2016-07-12 NOTE — Progress Notes (Signed)
Patient ID: Amber Rice, female   DOB: 1955/12/17, 60 y.o.   MRN: [id number] [place] [id number] 60 y.o.  07/12/2016  Chief Complaint: Follow up bipolar disorder   History of Present Illness:   Patient returns for Medication Follow up and is diagnosed with bipolar disorder depressed. Anxiety disorder NOS. Insomnia  Bipolar: we cut down the Haldol to 3 mg because of her own request that currently she is feeling somewhat paranoid and feeling down she wants to go back on the 4 mg depression is somewhat worsened Depression; somewhat worsened Anxiety; fluctuates depending on the stress level overall she takes Ativan half tablet once a day that keeps her going Shoulder pain; after surgery it is recovering and she is content  No other involuntary movement  She feels taking Ativan helps her anxiety and tremors. Mostly takes at night 30 tabs last for 2 months.   Duration of bipolar more than for 7  years  Depression scale: 5/10. 10 being no depression Modifying factors; go for a walk Aggravating factors poor sleep and finances medical conditions including pain    Past Medical History:  Diagnosis Date  . Degenerative disc disease, lumbar    diagnosed in 2005  . Hyperlipidemia    Family History  Problem Relation Age of Onset  . Cancer Mother   . Heart attack Father   . Hyperlipidemia Father   . Cancer Maternal Grandmother   . Cancer Paternal Grandmother   . Bipolar disorder Paternal Uncle     Committed suicide in his 15's  . Dementia Paternal Uncle   . Depression Cousin   . Schizophrenia Cousin   . Multiple sclerosis Sister   . Alcohol abuse Neg Hx   . Anxiety disorder Neg Hx     Outpatient Encounter Prescriptions as of 07/12/2016  Medication Sig  . haloperidol (HALDOL) 2 MG tablet Take 2 tablets (4 mg total) by mouth daily.  Marland Kitchen HYDROcodone-acetaminophen (NORCO/VICODIN) 5-325 MG tablet Take 1 tablet by mouth every 8  (eight) hours as needed for moderate pain.  Marland Kitchen lithium carbonate (LITHOBID) 300 MG CR tablet Take 1 tablet (300 mg total) by mouth 2 (two) times daily.  Marland Kitchen LORazepam (ATIVAN) 2 MG tablet Take half tablet a night for anxiety and sleep.  Marland Kitchen sertraline (ZOLOFT) 100 MG tablet TAKE 2 TABLETS (200 MG TOTAL) BY MOUTH DAILY.  . [DISCONTINUED] haloperidol (HALDOL) 1 MG tablet Take 3 tablets (3 mg total) by mouth at bedtime and may repeat dose one time if needed.  . [DISCONTINUED] lithium carbonate (LITHOBID) 300 MG CR tablet Take 1 tablet (300 mg total) by mouth 2 (two) times daily.  . [DISCONTINUED] LORazepam (ATIVAN) 2 MG tablet Take half tablet a night for anxiety and sleep.  . [DISCONTINUED] sertraline (ZOLOFT) 100 MG tablet TAKE 2 TABLETS (200 MG TOTAL) BY MOUTH DAILY.  . [DISCONTINUED] Salicylic Acid 6 % LOTN Apply once a day to the callus on her left elbow. (Patient not taking: Reported on 07/12/2016)   No facility-administered encounter medications on file as of 07/12/2016.     No results found for this or any previous visit (from the past 2160 hour(s)).  BP 124/76 (BP Location: Right Arm, Patient Position: Sitting, Cuff Size: Normal)   Pulse 96   Resp 16   Ht 5\' 5"  (1.651 m)   Wt 200 lb (90.7 kg)   SpO2 97%   BMI 33.28 kg/m    Review of Systems  Cardiovascular: Negative for  chest pain.  Gastrointestinal: Negative for nausea.  Neurological: Negative for headaches.  Psychiatric/Behavioral: Negative for depression, substance abuse and suicidal ideas.    Mental Status Examination  Appearance: casual Alert: Yes Attention: fair  Cooperative: Yes Eye Contact: Good Speech: normal Psychomotor Activity: Decreased Memory/Concentration: adequate Oriented: person, place, time/date and situation Mood: less distressed since surgery done Affect: Congruent Thought Processes and Associations: Coherent but endorses they are watching and keeping a tap on her  Fund of Knowledge: Fair Thought  Content: Suicidal ideation and Homicidal ideation were denied Insight: Fair Judgement: Fair  Diagnosis: Bipolar disorder type I manic per history for psychotic features. Insomnia NOS. Adjustment disorder with anxiety . EPS Treatment Plan:   1. Bipolar: depression somewhat worsened: increase back haldol 4mg  Continue zoloft 200mg  2. Depression worsened. See above  Labs as per last Lithium level 0.8 Paranoia: has felt more. Will increase haldol as above 3. Insomnia: reviewed sleep hygiene. Take ativan at night or half if needed. Prescriptions sent and printed, side effects reviewed, all questions answered FU in 4 -6 weeks or earlier if needed.    [name], MD

## 2016-07-17 DIAGNOSIS — Z79891 Long term (current) use of opiate analgesic: Secondary | ICD-10-CM | POA: Diagnosis not present

## 2016-07-17 DIAGNOSIS — G8929 Other chronic pain: Secondary | ICD-10-CM | POA: Diagnosis not present

## 2016-07-17 DIAGNOSIS — M47816 Spondylosis without myelopathy or radiculopathy, lumbar region: Secondary | ICD-10-CM | POA: Diagnosis not present

## 2016-07-17 DIAGNOSIS — M19012 Primary osteoarthritis, left shoulder: Secondary | ICD-10-CM | POA: Diagnosis not present

## 2016-09-07 ENCOUNTER — Ambulatory Visit (INDEPENDENT_AMBULATORY_CARE_PROVIDER_SITE_OTHER): Payer: Medicare Other | Admitting: Psychiatry

## 2016-09-07 ENCOUNTER — Encounter (HOSPITAL_COMMUNITY): Payer: Self-pay | Admitting: Psychiatry

## 2016-09-07 VITALS — BP 126/70 | HR 95 | Resp 16 | Ht 65.0 in | Wt 199.0 lb

## 2016-09-07 DIAGNOSIS — F5102 Adjustment insomnia: Secondary | ICD-10-CM

## 2016-09-07 DIAGNOSIS — Z808 Family history of malignant neoplasm of other organs or systems: Secondary | ICD-10-CM | POA: Diagnosis not present

## 2016-09-07 DIAGNOSIS — G8929 Other chronic pain: Secondary | ICD-10-CM | POA: Diagnosis not present

## 2016-09-07 DIAGNOSIS — Z818 Family history of other mental and behavioral disorders: Secondary | ICD-10-CM

## 2016-09-07 DIAGNOSIS — Z8249 Family history of ischemic heart disease and other diseases of the circulatory system: Secondary | ICD-10-CM

## 2016-09-07 DIAGNOSIS — Z79899 Other long term (current) drug therapy: Secondary | ICD-10-CM

## 2016-09-07 DIAGNOSIS — F315 Bipolar disorder, current episode depressed, severe, with psychotic features: Secondary | ICD-10-CM | POA: Diagnosis not present

## 2016-09-07 MED ORDER — HALOPERIDOL 2 MG PO TABS: 4.0000 mg | ORAL_TABLET | Freq: Every day | ORAL | 1 refills | Status: DC

## 2016-09-07 MED ORDER — LITHIUM CARBONATE ER 300 MG PO TBCR: 300.0000 mg | EXTENDED_RELEASE_TABLET | Freq: Two times a day (BID) | ORAL | 1 refills | Status: DC

## 2016-09-07 MED ORDER — SERTRALINE HCL 100 MG PO TABS: ORAL_TABLET | ORAL | 1 refills | Status: DC

## 2016-09-07 NOTE — Progress Notes (Signed)
Patient ID: Amber Rice, female   DOB: 1956-03-21, 61 y.o.   MRN: [id number] [place] [id number] 61 y.o.  09/07/2016  Chief Complaint: Follow up bipolar disorder   History of Present Illness:   Patient returns for Medication Follow up and is diagnosed with bipolar disorder depressed. Anxiety disorder NOS. Insomnia  BIpOLAR: improved. Haldol was increased back to 4mg  last visit. Not paranoid DepressIOn: improved. On zoloft Anxiety: fluctutates but not worsened She also has not been taking ativan.  She is also stopped taking Vicodin and is feeling more alert she is going to follow the pain clinic for possible injection if needed  Shoulder pain; after surgery it is recovering and she is content  No other involuntary movement   Duration of bipolar more than for 7  years  Depression scale:improved 7/10 Modifying factors; go for a walk Aggravating factors poor sleep and finances medical conditions including pain    Past Medical History:  Diagnosis Date  . Degenerative disc disease, lumbar    diagnosed in 2005  . Hyperlipidemia    Family History  Problem Relation Age of Onset  . Cancer Mother   . Heart attack Father   . Hyperlipidemia Father   . Cancer Maternal Grandmother   . Cancer Paternal Grandmother   . Bipolar disorder Paternal Uncle     Committed suicide in his 68's  . Dementia Paternal Uncle   . Depression Cousin   . Schizophrenia Cousin   . Multiple sclerosis Sister   . Alcohol abuse Neg Hx   . Anxiety disorder Neg Hx     Outpatient Encounter Prescriptions as of 09/07/2016  Medication Sig  . haloperidol (HALDOL) 2 MG tablet Take 2 tablets (4 mg total) by mouth daily.  Marland Kitchen lithium carbonate (LITHOBID) 300 MG CR tablet Take 1 tablet (300 mg total) by mouth 2 (two) times daily.  . sertraline (ZOLOFT) 100 MG tablet TAKE 2 TABLETS (200 MG TOTAL) BY MOUTH DAILY.  . [DISCONTINUED] haloperidol (HALDOL) 2 MG  tablet Take 2 tablets (4 mg total) by mouth daily.  . [DISCONTINUED] lithium carbonate (LITHOBID) 300 MG CR tablet Take 1 tablet (300 mg total) by mouth 2 (two) times daily.  . [DISCONTINUED] sertraline (ZOLOFT) 100 MG tablet TAKE 2 TABLETS (200 MG TOTAL) BY MOUTH DAILY.  . [DISCONTINUED] HYDROcodone-acetaminophen (NORCO/VICODIN) 5-325 MG tablet Take 1 tablet by mouth every 8 (eight) hours as needed for moderate pain. (Patient not taking: Reported on 09/07/2016)  . [DISCONTINUED] LORazepam (ATIVAN) 2 MG tablet Take half tablet a night for anxiety and sleep. (Patient not taking: Reported on 09/07/2016)   No facility-administered encounter medications on file as of 09/07/2016.     No results found for this or any previous visit (from the past 2160 hour(s)).  BP 126/70 (BP Location: Right Arm, Patient Position: Sitting, Cuff Size: Normal)   Pulse 95   Resp 16   Ht 5\' 5"  (1.651 m)   Wt 199 lb (90.3 kg)   SpO2 95%   BMI 33.12 kg/m    Review of Systems  Cardiovascular: Negative for palpitations.  Gastrointestinal: Negative for nausea.  Neurological: Negative for headaches.  Psychiatric/Behavioral: Negative for depression, substance abuse and suicidal ideas.    Mental Status Examination  Appearance: casual Alert: Yes Attention: fair  Cooperative: Yes Eye Contact: Good Speech: normal Psychomotor Activity: Decreased Memory/Concentration: adequate Oriented: person, place, time/date and situation Mood: euthymic Affect: Congruent Thought Processes and Associations: Coherent but endorses they are watching  and keeping a tap on her  Fund of Knowledge: Fair Thought Content: Suicidal ideation and Homicidal ideation were denied Insight: Fair Judgement: Fair  Diagnosis: Bipolar disorder type I manic per history for psychotic features. Insomnia NOS. Adjustment disorder with anxiety . EPS Treatment Plan:   1. Bipolar: depression : improved. Will continue haldol. No eps. Continue zoloft  200mg  2. Depression: improved. Continue zoloft   Paranoia: improved. Continue haldol  3. Insomnia: reviewed sleep hygiene.not on ativan regularly now  Prescriptions sent  side effects reviewed, all questions answered FU in 2-3 months. Also follow up with Pain clinic   [name], MD

## 2016-09-15 ENCOUNTER — Encounter (HOSPITAL_COMMUNITY): Payer: Self-pay | Admitting: Psychiatry

## 2016-09-15 ENCOUNTER — Telehealth (HOSPITAL_COMMUNITY): Payer: Self-pay | Admitting: *Deleted

## 2016-09-15 NOTE — Telephone Encounter (Signed)
Pt called requesting a letter to be removed from jury duty. Please advise.

## 2016-09-15 NOTE — Telephone Encounter (Signed)
Letter printed to be excused

## 2016-09-17 NOTE — Telephone Encounter (Signed)
Lvm informing pt letter is ready for pickup on 09/18/16.

## 2016-10-12 ENCOUNTER — Ambulatory Visit (INDEPENDENT_AMBULATORY_CARE_PROVIDER_SITE_OTHER): Payer: Medicare Other

## 2016-10-12 ENCOUNTER — Ambulatory Visit (INDEPENDENT_AMBULATORY_CARE_PROVIDER_SITE_OTHER): Payer: Medicare Other | Admitting: Family Medicine

## 2016-10-12 VITALS — BP 126/64 | HR 104 | Ht 65.0 in | Wt 200.0 lb

## 2016-10-12 DIAGNOSIS — Z720 Tobacco use: Secondary | ICD-10-CM

## 2016-10-12 DIAGNOSIS — R059 Cough, unspecified: Secondary | ICD-10-CM

## 2016-10-12 DIAGNOSIS — R05 Cough: Secondary | ICD-10-CM

## 2016-10-12 DIAGNOSIS — R042 Hemoptysis: Secondary | ICD-10-CM

## 2016-10-12 DIAGNOSIS — F17200 Nicotine dependence, unspecified, uncomplicated: Secondary | ICD-10-CM

## 2016-10-12 NOTE — Progress Notes (Signed)
Subjective:    Patient ID: Amber Rice, female    DOB: 02-Jun-1956, 61 y.o.   MRN: [id number]  HPI 61 yo female comes into today After having coughed up blood. She said she actually was sleeping on her right side and woke up and noticed some bright red blood on her pillow. She said actually soaked through the case on the pillow itself. She got up and went to the bathroom and noticed a line of blood from the right side of her mouth to the side of her face. She reports that she has had an increase in her baseline cough over the last 2 months. She's been smoking a pack a day for most 42 years. She also has a chronically hoarse voice. She denies any recent fevers chills or sweats. He said again when she coughed today she noticed some clear sputum that was blood-tinged. She's also had some intermittent wheezing.   Review of Systems   BP 126/64   Pulse (!) 104   Ht 5\' 5"  (1.651 m)   Wt 200 lb (90.7 kg)   BMI 33.28 kg/m     Allergies  Allergen Reactions  . Lipitor [Atorvastatin] Swelling    Past Medical History:  Diagnosis Date  . Degenerative disc disease, lumbar    diagnosed in 2005  . Hyperlipidemia     Past Surgical History:  Procedure Laterality Date  . ABDOMINAL HYSTERECTOMY    . APPENDECTOMY    . LAPAROTOMY      Social History   Social History  . Marital status: Widowed    Spouse name: N/A  . Number of children: N/A  . Years of education: N/A   Occupational History  . Not on file.   Social History Main Topics  . Smoking status: Current Every Day Smoker    Packs/day: 1.00    Years: 44.00    Types: Cigarettes  . Smokeless tobacco: Never Used  . Alcohol use No  . Drug use: No     Comment: Caffeine: Coffee 1-2 cups  per day. 24 Ounce tea, 32-48 ounces pepsI  . Sexual activity: Yes   Other Topics Concern  . Not on file   Social History Narrative  . No narrative on file    Family History  Problem Relation Age of Onset  . Cancer Mother   . Heart attack  Father   . Hyperlipidemia Father   . Cancer Maternal Grandmother   . Cancer Paternal Grandmother   . Bipolar disorder Paternal Uncle     Committed suicide in his 1's  . Dementia Paternal Uncle   . Depression Cousin   . Schizophrenia Cousin   . Multiple sclerosis Sister   . Alcohol abuse Neg Hx   . Anxiety disorder Neg Hx     Outpatient Encounter Prescriptions as of 10/12/2016  Medication Sig  . haloperidol (HALDOL) 2 MG tablet Take 2 tablets (4 mg total) by mouth daily.  Marland Kitchen lithium carbonate (LITHOBID) 300 MG CR tablet Take 1 tablet (300 mg total) by mouth 2 (two) times daily.  . sertraline (ZOLOFT) 100 MG tablet TAKE 2 TABLETS (200 MG TOTAL) BY MOUTH DAILY.   No facility-administered encounter medications on file as of 10/12/2016.           Objective:   Physical Exam  Constitutional: She is oriented to person, place, and time. She appears well-developed and well-nourished.  HENT:  Head: Normocephalic and atraumatic.  Right Ear: External ear normal.  Left Ear: External ear  normal.  Nose: Nose normal.  Mouth/Throat: Oropharynx is clear and moist.  Left canal blocked by cerumen. Right TMs and canals are clear.   Eyes: Conjunctivae and EOM are normal. Pupils are equal, round, and reactive to light.  Neck: Neck supple. No thyromegaly present.  Cardiovascular: Normal rate, regular rhythm and normal heart sounds.   Pulmonary/Chest: Effort normal and breath sounds normal. She has no wheezes.  Lymphadenopathy:    She has no cervical adenopathy.  Neurological: She is alert and oriented to person, place, and time.  Skin: Skin is warm and dry.  Psychiatric: She has a normal mood and affect.       Assessment & Plan:  Cough with bloody sputum-at this point we'll move forward with chest x-ray for further evaluation. She could also be having an underlying COPD exacerbation. Consider lung cancer etc. I think overall she is low risk for TB but may be worth having her come in next week  for placement of a TB skin test.  Tobacco abuse-encourage cessation and encouraged to schedule spirometry in about 2 weeks with her primary care provider as she's never had this done before.

## 2016-10-13 MED ORDER — PREDNISONE 20 MG PO TABS: 40.0000 mg | ORAL_TABLET | Freq: Every day | ORAL | 0 refills | Status: DC

## 2016-10-13 MED ORDER — DOXYCYCLINE HYCLATE 100 MG PO TABS: 100.0000 mg | ORAL_TABLET | Freq: Two times a day (BID) | ORAL | 0 refills | Status: DC

## 2016-10-16 ENCOUNTER — Ambulatory Visit: Payer: Self-pay

## 2016-10-18 ENCOUNTER — Ambulatory Visit: Payer: Self-pay

## 2016-10-27 ENCOUNTER — Other Ambulatory Visit: Payer: Self-pay

## 2016-10-30 ENCOUNTER — Ambulatory Visit: Payer: Medicare Other

## 2016-10-31 ENCOUNTER — Ambulatory Visit (INDEPENDENT_AMBULATORY_CARE_PROVIDER_SITE_OTHER): Payer: Medicare Other | Admitting: Family Medicine

## 2016-10-31 VITALS — BP 137/65 | HR 105 | Temp 98.2°F | Ht 65.0 in | Wt 200.1 lb

## 2016-10-31 DIAGNOSIS — Z111 Encounter for screening for respiratory tuberculosis: Secondary | ICD-10-CM

## 2016-10-31 NOTE — Progress Notes (Signed)
Pt came into clinic today for PPD Placement. Incorrect dose administered in Right arm. Test not valid per [facility] nurse reviewer Jeani Hawking. Pt has been notified and will come in next week for placement.

## 2016-11-01 ENCOUNTER — Ambulatory Visit: Payer: Self-pay

## 2016-11-02 ENCOUNTER — Ambulatory Visit: Payer: Medicare Other

## 2016-11-07 ENCOUNTER — Ambulatory Visit (INDEPENDENT_AMBULATORY_CARE_PROVIDER_SITE_OTHER): Payer: Medicare Other | Admitting: Physician Assistant

## 2016-11-07 VITALS — BP 126/65 | HR 100 | Ht 65.0 in | Wt 200.0 lb

## 2016-11-07 DIAGNOSIS — Z111 Encounter for screening for respiratory tuberculosis: Secondary | ICD-10-CM

## 2016-11-07 DIAGNOSIS — R05 Cough: Secondary | ICD-10-CM | POA: Diagnosis not present

## 2016-11-07 DIAGNOSIS — R059 Cough, unspecified: Secondary | ICD-10-CM

## 2016-11-07 MED ORDER — ALBUTEROL SULFATE (2.5 MG/3ML) 0.083% IN NEBU: 2.5000 mg | INHALATION_SOLUTION | Freq: Four times a day (QID) | RESPIRATORY_TRACT | 1 refills | Status: DC | PRN

## 2016-11-07 MED ORDER — ALBUTEROL SULFATE (2.5 MG/3ML) 0.083% IN NEBU
2.5000 mg | INHALATION_SOLUTION | Freq: Once | RESPIRATORY_TRACT | Status: AC
  Administered 2016-11-07: 2.5 mg via RESPIRATORY_TRACT

## 2016-11-07 NOTE — Progress Notes (Signed)
   Subjective:    Patient ID: Amber Rice, female    DOB: 02-Oct-1955, 61 y.o.   MRN: [id number]  HPI Pt has smoked for 42 years and stopped for last week. After being seen for bloody sputum and given prednison/abx she has not coughed or been SOB. She does not usually have a problem with cough or SOB. She continues to do all activities she normally would do.  She comes in to discuss spironmetry.    Review of Systems    see HPi.  Objective:   Physical Exam  Constitutional: She is oriented to person, place, and time. She appears well-developed and well-nourished.  Obese.   HENT:  Head: Normocephalic and atraumatic.  Cardiovascular: Normal rate, regular rhythm and normal heart sounds.   Pulmonary/Chest: Effort normal and breath sounds normal.  Neurological: She is alert and oriented to person, place, and time.  Psychiatric: She has a normal mood and affect. Her behavior is normal.          Assessment & Plan:  Marland KitchenMarland KitchenFathima was seen today for cough.  Diagnoses and all orders for this visit:  Cough -     Spirometry: Pre & Post Eval -     albuterol (PROVENTIL) (2.5 MG/3ML) 0.083% nebulizer solution 2.5 mg; Take 3 mLs (2.5 mg total) by nebulization once.  Screening-pulmonary TB -     PPD  Other orders -     Discontinue: albuterol (PROVENTIL) (2.5 MG/3ML) 0.083% nebulizer solution; Take 3 mLs (2.5 mg total) by nebulization every 6 (six) hours as needed for wheezing or shortness of breath.  FVC was 74, FEV1 was 77, FEF 25-75 was change of 46 percent.   Pattern is suggestive of restrictive pattern; however, patient is not symptomatic today nor does she have a chronic history of this. I suspect obesity or nature of the test yielding result. Will not seek and new testing or referral until patient is symptomatic. She does not want to procedure with any treatment at this time.  Discussed signs and symptoms and will follow up as needed.

## 2016-11-07 NOTE — Progress Notes (Signed)
   Subjective:    Patient ID: Amber Rice, female    DOB: 1955-11-19, 61 y.o.   MRN: [id number]  HPI Pt is here for a PPD placement and a spirometry test.   Review of Systems     Objective:   Physical Exam        Assessment & Plan:  Pt tolerated injection well without complications. Pt stated she quit smoking 1 week ago. Pt advised to schedule a nurse visit in 48-72 hours for PPD read.

## 2016-11-08 ENCOUNTER — Encounter: Payer: Self-pay | Admitting: Physician Assistant

## 2016-11-09 ENCOUNTER — Ambulatory Visit (INDEPENDENT_AMBULATORY_CARE_PROVIDER_SITE_OTHER): Payer: Medicare Other | Admitting: Family Medicine

## 2016-11-09 DIAGNOSIS — Z111 Encounter for screening for respiratory tuberculosis: Secondary | ICD-10-CM

## 2016-11-09 LAB — TB SKIN TEST
INDURATION: 0 mm
TB Skin Test: NEGATIVE

## 2016-11-09 NOTE — Progress Notes (Signed)
Patient seen in office nurse visit for PPD reading of L forearm. Results were negative and patient states she has had no complications since PPD was placed.

## 2016-11-13 ENCOUNTER — Other Ambulatory Visit (HOSPITAL_COMMUNITY): Payer: Self-pay | Admitting: Psychiatry

## 2016-11-13 MED ORDER — SERTRALINE HCL 100 MG PO TABS: ORAL_TABLET | ORAL | 1 refills | Status: DC

## 2016-11-13 MED ORDER — LITHIUM CARBONATE ER 300 MG PO TBCR: 300.0000 mg | EXTENDED_RELEASE_TABLET | Freq: Two times a day (BID) | ORAL | 1 refills | Status: DC

## 2016-11-13 NOTE — Telephone Encounter (Signed)
Received fax from [facility] requesting refills for Haldol, Lithium and Zoloft. Per Dr. [name], refills are authorize for Haldol 2mg , #60 w/ 1 refill, Lithium 300mg , #60 w/ 1 refill and Zoloft 100mg , #60 w/ 1 refill. Rx's were sent to pharmacy. Pt's next apt is schedule on 11/23/16. Called and informed pt of refill status. Pt verbalizes understanding.

## 2016-11-14 ENCOUNTER — Other Ambulatory Visit (HOSPITAL_COMMUNITY): Payer: Self-pay | Admitting: Psychiatry

## 2016-11-23 ENCOUNTER — Ambulatory Visit (INDEPENDENT_AMBULATORY_CARE_PROVIDER_SITE_OTHER): Payer: Medicare Other | Admitting: Psychiatry

## 2016-11-23 ENCOUNTER — Encounter (HOSPITAL_COMMUNITY): Payer: Self-pay | Admitting: Psychiatry

## 2016-11-23 VITALS — BP 120/80 | HR 97 | Resp 16 | Ht 65.0 in | Wt 199.0 lb

## 2016-11-23 DIAGNOSIS — F315 Bipolar disorder, current episode depressed, severe, with psychotic features: Secondary | ICD-10-CM

## 2016-11-23 DIAGNOSIS — Z810 Family history of intellectual disabilities: Secondary | ICD-10-CM | POA: Diagnosis not present

## 2016-11-23 DIAGNOSIS — Z818 Family history of other mental and behavioral disorders: Secondary | ICD-10-CM

## 2016-11-23 DIAGNOSIS — F5102 Adjustment insomnia: Secondary | ICD-10-CM

## 2016-11-23 DIAGNOSIS — G8929 Other chronic pain: Secondary | ICD-10-CM

## 2016-11-23 NOTE — Progress Notes (Signed)
**Note Amber-Identified via Obfuscation** Patient ID: Amber Rice, female   DOB: January 13, 1956, 61 y.o.   MRN: [id number] Amber Rice [id number] 61 y.o.  11/23/2016  Chief Complaint: Follow up bipolar disorder   History of Present Illness:   Patient returns for Medication Follow up and is diagnosed with bipolar disorder depressed. Anxiety disorder NOS. Insomnia  Patient continues to do reasonable not significantly paranoid mood symptoms are more stable on lithium and Zoloft. She is not endorsing significant paranoia does not endorse hallucinations. Sleep energy is improving. She fols up with a pcian also manages her pain she's not on Vicodin, or Ativan. She is feeling more alert  Shoulder pain; after surgery it is recovering and she is content  No other involuntary movement   Duration of bipolar more than for 7  years  Depression scale: improved. 7.5/10 Modifying factors; go for a walk Aggravating factors pain and medical conditions. lonliness.   Past Medical History:  Diagnosis Date  . Degenerative disc disease, lumbar    diagnosed in 2005  . Hyperlipidemia    Family History  Problem Relation Age of Onset  . Cancer Mother   . Heart attack Father   . Hyperlipidemia Father   . Cancer Maternal Grandmother   . Cancer Paternal Grandmother   . Bipolar disorder Paternal Uncle     Committed suicide in his 58's  . Dementia Paternal Uncle   . Depression Cousin   . Schizophrenia Cousin   . Multiple sclerosis Sister   . Alcohol abuse Neg Hx   . Anxiety disorder Neg Hx     Outpatient Encounter Prescriptions as of 11/23/2016  Medication Sig  . haloperidol (HALDOL) 2 MG tablet TAKE 2 TABLETS (4 MG TOTAL) BY MOUTH DAILY.  Marland Kitchen lithium carbonate (LITHOBID) 300 MG CR tablet Take 1 tablet (300 mg total) by mouth 2 (two) times daily.  . sertraline (ZOLOFT) 100 MG tablet TAKE 2 TABLETS (200 MG TOTAL) BY MOUTH DAILY.   No facility-administered encounter medications on file as  of 11/23/2016.     Recent Results (from the past 2160 hour(s))  PPD     Status: None   Collection Time: 11/09/16  3:10 PM  Result Value Ref Range   TB Skin Test Negative    Induration 0 mm    BP 120/80 (BP Location: Right Arm, Patient Position: Sitting, Cuff Size: Normal)   Pulse 97   Resp 16   Ht 5\' 5"  (1.651 m)   Wt 199 lb (90.3 kg)   SpO2 95%   BMI 33.12 kg/m    Review of Systems  Cardiovascular: Negative for chest pain.  Gastrointestinal: Negative for nausea.  Neurological: Negative for headaches.  Psychiatric/Behavioral: Negative for substance abuse and suicidal ideas.    Mental Status Examination  Appearance: casual Alert: Yes Attention: fair  Cooperative: Yes Eye Contact: Good Speech: normal Psychomotor Activity: Decreased Memory/Concentration: adequate Oriented: person, place, time/date and situation Mood: euthymic, pleasant Affect: congruent Thought Processes and Associations: Coherent but endorses they are watching and keeping a tap on her  Fund of Knowledge: Fair Thought Content: Suicidal ideation and Homicidal ideation were denied Insight: Fair Judgement: Fair  Diagnosis: Bipolar disorder type I manic per history for psychotic features. Insomnia NOS. Adjustment disorder with anxiety . EPS Treatment Plan:   1. Bipolar: depression : not worse. Tolerating lithium. Some tremors not worse.  2. Depression: better, continue zoloft  Paranoia: baseline. Continue haldol 4mg .  3. Insomnia: improved. .  Review side effects and  questions were addressed follow-up in 4 months.. Prescriptions were sent one week ago she can call in for more refills after second month  Amber Rice, Amber Schuld, MD

## 2016-11-28 NOTE — Telephone Encounter (Signed)
Received fax from [facility] requesting a refill for Zoloft. Per Dr. [name], refill is denied. Zoloft refill was sent to pharmacy on 11/13/16 w/ 1 refill. Pt's next apt is schedule on 03/20/17. Nothing further is needed at this time.

## 2017-01-08 ENCOUNTER — Other Ambulatory Visit (HOSPITAL_COMMUNITY): Payer: Self-pay | Admitting: Psychiatry

## 2017-01-09 ENCOUNTER — Other Ambulatory Visit (HOSPITAL_COMMUNITY): Payer: Self-pay | Admitting: Psychiatry

## 2017-01-11 MED ORDER — LITHIUM CARBONATE ER 300 MG PO TBCR: 300.0000 mg | EXTENDED_RELEASE_TABLET | Freq: Two times a day (BID) | ORAL | 1 refills | Status: DC

## 2017-01-11 NOTE — Telephone Encounter (Signed)
Received fax from [facility] requesting a refill for Haldol. Per Dr. [name] refill is authorize for Haldol 2mg , #60 w/ 1 refill. Rx was sent to pharmacy. Pt's next apt is schedule on 03/20/17. Lvm informing pt of refill status.

## 2017-01-11 NOTE — Telephone Encounter (Addendum)
Received fax from [facility] requesting a refill for Zoloft and Lithium. Per Dr. [name] refill is authorize for Zoloft 100mg , #60 w/ 1 refill and Lithium 300mg , #60 w/ 1 refill. Rx was sent to pharmacy. Pt's next apt is schedule on 03/20/17. Lvm informing pt of refill status.

## 2017-02-26 ENCOUNTER — Other Ambulatory Visit: Payer: Self-pay | Admitting: Physician Assistant

## 2017-02-26 DIAGNOSIS — Z1231 Encounter for screening mammogram for malignant neoplasm of breast: Secondary | ICD-10-CM

## 2017-03-12 ENCOUNTER — Other Ambulatory Visit (HOSPITAL_COMMUNITY): Payer: Self-pay | Admitting: Psychiatry

## 2017-03-14 NOTE — Telephone Encounter (Signed)
Medication refill- received fax from [facility] requesting refills for Zoloft and Haldol. Per Dr. [name], refills are authorize for Zoloft 100mg ,#60 and Haldol 2mg , #60. Rx was sent to pharmacy. Pt's next apt is schedule on 03/20/17. Called and informed pt of refill status. Pt verbalizes understanding.

## 2017-03-20 ENCOUNTER — Ambulatory Visit (INDEPENDENT_AMBULATORY_CARE_PROVIDER_SITE_OTHER): Payer: Medicare Other | Admitting: Psychiatry

## 2017-03-20 ENCOUNTER — Encounter (HOSPITAL_COMMUNITY): Payer: Self-pay | Admitting: Psychiatry

## 2017-03-20 VITALS — BP 122/80 | HR 97 | Resp 18 | Ht 65.0 in | Wt 187.4 lb

## 2017-03-20 DIAGNOSIS — F5102 Adjustment insomnia: Secondary | ICD-10-CM

## 2017-03-20 DIAGNOSIS — Z818 Family history of other mental and behavioral disorders: Secondary | ICD-10-CM | POA: Diagnosis not present

## 2017-03-20 DIAGNOSIS — Z810 Family history of intellectual disabilities: Secondary | ICD-10-CM | POA: Diagnosis not present

## 2017-03-20 DIAGNOSIS — F419 Anxiety disorder, unspecified: Secondary | ICD-10-CM

## 2017-03-20 DIAGNOSIS — G8929 Other chronic pain: Secondary | ICD-10-CM | POA: Diagnosis not present

## 2017-03-20 DIAGNOSIS — F315 Bipolar disorder, current episode depressed, severe, with psychotic features: Secondary | ICD-10-CM

## 2017-03-20 MED ORDER — HALOPERIDOL 2 MG PO TABS: 4.0000 mg | ORAL_TABLET | Freq: Every day | ORAL | 1 refills | Status: DC

## 2017-03-20 MED ORDER — SERTRALINE HCL 100 MG PO TABS: ORAL_TABLET | ORAL | 1 refills | Status: DC

## 2017-03-20 MED ORDER — LITHIUM CARBONATE ER 300 MG PO TBCR: 300.0000 mg | EXTENDED_RELEASE_TABLET | Freq: Two times a day (BID) | ORAL | 1 refills | Status: DC

## 2017-03-20 NOTE — Progress Notes (Signed)
Patient ID: Amber Rice, female   DOB: [birth year], 61 y.o.   MRN: [id number] [place] [id number] 61 y.o.  03/20/2017  Chief Complaint: Follow up bipolar disorder   History of Present Illness:   Patient returns for Medication Follow up and is diagnosed with bipolar disorder depressed. Anxiety disorder NOS. Insomnia  Patient continues to do reasonable she likes the medication. It is. Lithium was kept her more stable and mood-wise anxiety was depression is not worse Zoloft has helped. She is not taking Ativan since generally she feels more clear   Shoulder pain; after surgery it is recovering and she is content  No tremors   Duration of bipolar more than for 8 years  Depression scale: 8/10  Modifying factors;  Walking  Aggravating factors: pan . Lonliness.miss her husband died 7 years ago  no substance use  Past Medical History:  Diagnosis Date  . Degenerative disc disease, lumbar    diagnosed in 2005  . Hyperlipidemia    Family History  Problem Relation Age of Onset  . Cancer Mother   . Heart attack Father   . Hyperlipidemia Father   . Cancer Maternal Grandmother   . Cancer Paternal Grandmother   . Bipolar disorder Paternal Uncle        Committed suicide in his 73's  . Dementia Paternal Uncle   . Depression Cousin   . Schizophrenia Cousin   . Multiple sclerosis Sister   . Alcohol abuse Neg Hx   . Anxiety disorder Neg Hx     Outpatient Encounter Prescriptions as of 03/20/2017  Medication Sig  . haloperidol (HALDOL) 2 MG tablet Take 2 tablets (4 mg total) by mouth daily.  Marland Kitchen lithium carbonate (LITHOBID) 300 MG CR tablet Take 1 tablet (300 mg total) by mouth 2 (two) times daily.  . sertraline (ZOLOFT) 100 MG tablet TAKE 2 TABLETS (200 MG TOTAL) BY MOUTH DAILY.  . [DISCONTINUED] haloperidol (HALDOL) 2 MG tablet TAKE 2 TABLETS (4 MG TOTAL) BY MOUTH DAILY.  . [DISCONTINUED] lithium carbonate (LITHOBID) 300 MG CR  tablet Take 1 tablet (300 mg total) by mouth 2 (two) times daily.  . [DISCONTINUED] sertraline (ZOLOFT) 100 MG tablet TAKE 2 TABLETS (200 MG TOTAL) BY MOUTH DAILY.   No facility-administered encounter medications on file as of 03/20/2017.     No results found for this or any previous visit (from the past 2160 hour(s)).  BP 122/80 (BP Location: Right Arm, Patient Position: Sitting, Cuff Size: Normal)   Pulse 97   Resp 18   Ht 5\' 5"  (1.651 m)   Wt 187 lb 6.4 oz (85 kg)   SpO2 96%   BMI 31.18 kg/m    Review of Systems  Cardiovascular: Negative for palpitations.  Gastrointestinal: Negative for nausea.  Neurological: Negative for headaches.  Psychiatric/Behavioral: Negative for substance abuse and suicidal ideas.    Mental Status Examination  Appearance: casual Alert: Yes Attention: fair  Cooperative: Yes Eye Contact: Good Speech: normal Psychomotor Activity: Decreased Memory/Concentration: adequate Oriented: person, place, time/date and situation Mood: pleasant Affect: reactive Thought Processes and Associations: Coherent no paranoia as of now [employer] of Knowledge: Fair Thought Content: Suicidal ideation and Homicidal ideation were denied Insight: Fair Judgement: Fair  Diagnosis: Bipolar disorder type I manic per history for psychotic features. Insomnia NOS. Adjustment disorder with anxiety . EPS Treatment Plan:   1. Bipolar: depression :not worse. Continue lithium. No side effects 2. Depression:improving. Continue zoloft  Paranoia: baseline. Continue haldol.  No tremors  3. Insomnia: fair. Sleep hygiene reviewed  Questions reviewed. Prescriptions sent. FU 4 months  [name], Micheal Murad, MD

## 2017-03-27 ENCOUNTER — Encounter: Payer: Self-pay | Admitting: Physician Assistant

## 2017-03-27 ENCOUNTER — Ambulatory Visit (INDEPENDENT_AMBULATORY_CARE_PROVIDER_SITE_OTHER): Payer: Medicare Other | Admitting: Physician Assistant

## 2017-03-27 VITALS — BP 134/84 | HR 91 | Wt 188.0 lb

## 2017-03-27 DIAGNOSIS — H66002 Acute suppurative otitis media without spontaneous rupture of ear drum, left ear: Secondary | ICD-10-CM | POA: Diagnosis not present

## 2017-03-27 MED ORDER — CEFUROXIME AXETIL 250 MG PO TABS: 250.0000 mg | ORAL_TABLET | Freq: Two times a day (BID) | ORAL | 0 refills | Status: AC

## 2017-03-27 NOTE — Patient Instructions (Signed)
-   Antibiotic twice a day for 1 week - Tylenol 500 mg every 8 hours as needed for ear pain or Advil 600mg  every 8 hours

## 2017-03-27 NOTE — Progress Notes (Signed)
HPI:                                                                Amber Rice is a 61 y.o. female who presents to [facility]: [place] today for left ear pain  Otalgia   There is pain in the left ear. This is a new problem. The current episode started yesterday. The problem occurs constantly. There has been no fever. The pain is moderate. Associated symptoms include hearing loss. Pertinent negatives include no ear discharge. She has tried heat packs and ear drops for the symptoms. The treatment provided no relief.     Past Medical History:  Diagnosis Date  . Degenerative disc disease, lumbar    diagnosed in 2005  . Hyperlipidemia    Past Surgical History:  Procedure Laterality Date  . ABDOMINAL HYSTERECTOMY    . APPENDECTOMY    . LAPAROTOMY     Social History  Substance Use Topics  . Smoking status: Current Every Day Smoker    Packs/day: 1.00    Years: 44.00    Types: Cigarettes    Last attempt to quit: 10/30/2016  . Smokeless tobacco: Never Used  . Alcohol use No   family history includes Bipolar disorder in her paternal uncle; Cancer in her maternal grandmother, mother, and paternal grandmother; Dementia in her paternal uncle; Depression in her cousin; Heart attack in her father; Hyperlipidemia in her father; Multiple sclerosis in her sister; Schizophrenia in her cousin.  ROS: negative except as noted in the HPI  Medications: Current Outpatient Prescriptions  Medication Sig Dispense Refill  . cefUROXime (CEFTIN) 250 MG tablet Take 1 tablet (250 mg total) by mouth 2 (two) times daily with a meal. 14 tablet 0  . haloperidol (HALDOL) 2 MG tablet Take 2 tablets (4 mg total) by mouth daily. 60 tablet 1  . lithium carbonate (LITHOBID) 300 MG CR tablet Take 1 tablet (300 mg total) by mouth 2 (two) times daily. 60 tablet 1  . sertraline (ZOLOFT) 100 MG tablet TAKE 2 TABLETS (200 MG TOTAL) BY MOUTH DAILY. 60 tablet 1   No current  facility-administered medications for this visit.    Allergies  Allergen Reactions  . Lipitor [Atorvastatin] Swelling       Objective:  BP 134/84   Pulse 91   Wt 188 lb (85.3 kg)   BMI 31.28 kg/m  Gen: well-groomed, cooperative, not ill-appearing, no distress HEENT: normal conjunctiva, wearing glasses, right external canal clear, right TM pearly gray, left external canal red without debris, left TM erythematous with suppurative effusion, no mastoid tenderness ,trachea midline Pulm: Normal work of breathing, normal phonation Neuro: alert and oriented x 3, no tremor MSK: extremities atraumatic, normal gait and station Skin: intact, no rashes on exposed skin, no jaundice, no cyanosis Psych: well-groomed, cooperative, good eye contact, euthymic mood, affect mood-congruent, speech is articulate, and thought processes clear and goal-directed    No results found for this or any previous visit (from the past 72 hour(s)). No results found.    Assessment and Plan: 61 y.o. female with   1. Acute suppurative otitis media of left ear without spontaneous rupture of tympanic membrane, recurrence not specified - tympanogram shows low peak height - cefUROXime (CEFTIN) 250 MG  tablet; Take 1 tablet (250 mg total) by mouth 2 (two) times daily with a meal.  Dispense: 14 tablet; Refill: 0 - Tylenol 500 mg every 8 hours as needed for ear pain or Advil 600mg  every 8 hours    Patient education and anticipatory guidance given Patient agrees with treatment plan Follow-up as needed if symptoms worsen or fail to improve  Darlyne Russian PA-C

## 2017-03-28 ENCOUNTER — Ambulatory Visit (INDEPENDENT_AMBULATORY_CARE_PROVIDER_SITE_OTHER): Payer: Medicare Other

## 2017-03-28 DIAGNOSIS — Z1231 Encounter for screening mammogram for malignant neoplasm of breast: Secondary | ICD-10-CM | POA: Diagnosis not present

## 2017-04-03 ENCOUNTER — Ambulatory Visit: Payer: Self-pay | Admitting: Physician Assistant

## 2017-04-23 ENCOUNTER — Encounter: Payer: Self-pay | Admitting: Physician Assistant

## 2017-04-23 ENCOUNTER — Ambulatory Visit (INDEPENDENT_AMBULATORY_CARE_PROVIDER_SITE_OTHER): Payer: Medicare Other | Admitting: Physician Assistant

## 2017-04-23 VITALS — BP 121/79 | HR 87 | Temp 98.2°F | Wt 188.0 lb

## 2017-04-23 DIAGNOSIS — H6122 Impacted cerumen, left ear: Secondary | ICD-10-CM

## 2017-04-23 DIAGNOSIS — H9202 Otalgia, left ear: Secondary | ICD-10-CM | POA: Diagnosis not present

## 2017-04-23 HISTORY — DX: Impacted cerumen, left ear: H61.22

## 2017-04-23 MED ORDER — ACETIC ACID 2 % OT SOLN: 4.0000 [drp] | Freq: Three times a day (TID) | OTIC | 0 refills | Status: DC

## 2017-04-23 NOTE — Patient Instructions (Signed)
Earwax Buildup, Adult The ears produce a substance called earwax that helps keep bacteria out of the ear and protects the skin in the ear canal. Occasionally, earwax can build up in the ear and cause discomfort or hearing loss. What increases the risk? This condition is more likely to develop in people who:  Are female.  Are elderly.  Naturally produce more earwax.  Clean their ears often with cotton swabs.  Use earplugs often.  Use in-ear headphones often.  Wear hearing aids.  Have narrow ear canals.  Have earwax that is overly thick or sticky.  Have eczema.  Are dehydrated.  Have excess hair in the ear canal.  What are the signs or symptoms? Symptoms of this condition include:  Reduced or muffled hearing.  A feeling of fullness in the ear or feeling that the ear is plugged.  Fluid coming from the ear.  Ear pain.  Ear itch.  Ringing in the ear.  Coughing.  An obvious piece of earwax that can be seen inside the ear canal.  How is this diagnosed? This condition may be diagnosed based on:  Your symptoms.  Your medical history.  An ear exam. During the exam, your health care provider will look into your ear with an instrument called an otoscope.  You may have tests, including a hearing test. How is this treated? This condition may be treated by:  Using ear drops to soften the earwax.  Having the earwax removed by a health care provider. The health care provider may: ? Flush the ear with water. ? Use an instrument that has a loop on the end (curette). ? Use a suction device.  Surgery to remove the wax buildup. This may be done in severe cases.  Follow these instructions at home:  Take over-the-counter and prescription medicines only as told by your health care provider.  Do not put any objects, including cotton swabs, into your ear. You can clean the opening of your ear canal with a washcloth or facial tissue.  Follow instructions from your health  care provider about cleaning your ears. Do not over-clean your ears.  Drink enough fluid to keep your urine clear or pale yellow. This will help to thin the earwax.  Keep all follow-up visits as told by your health care provider. If earwax builds up in your ears often or if you use hearing aids, consider seeing your health care provider for routine, preventive ear cleanings. Ask your health care provider how often you should schedule your cleanings.  If you have hearing aids, clean them according to instructions from the manufacturer and your health care provider. Contact a health care provider if:  You have ear pain.  You develop a fever.  You have blood, pus, or other fluid coming from your ear.  You have hearing loss.  You have ringing in your ears that does not go away.  Your symptoms do not improve with treatment.  You feel like the room is spinning (vertigo). Summary  Earwax can build up in the ear and cause discomfort or hearing loss.  The most common symptoms of this condition include reduced or muffled hearing and a feeling of fullness in the ear or feeling that the ear is plugged.  This condition may be diagnosed based on your symptoms, your medical history, and an ear exam.  This condition may be treated by using ear drops to soften the earwax or by having the earwax removed by a health care provider.  Do   not put any objects, including cotton swabs, into your ear. You can clean the opening of your ear canal with a washcloth or facial tissue. This information is not intended to replace advice given to you by your health care provider. Make sure you discuss any questions you have with your health care provider. Document Released: 08/31/2004 Document Revised: 10/04/2016 Document Reviewed: 10/04/2016 Elsevier Interactive Patient Education  2018 [employer]vier Inc[employer]  

## 2017-04-23 NOTE — Progress Notes (Signed)
HPI:                                                                Amber Rice is a 61 y.o. female who presents to [facility]: [facility] today for left ear pain  Otalgia   There is pain in the left ear. This is a recurrent problem. The current episode started in the past 7 days (x 1 week). The problem occurs constantly. The problem has been gradually worsening. There has been no fever. The pain is moderate. Associated symptoms include hearing loss. Pertinent negatives include no coughing, ear discharge, rhinorrhea or sore throat. She has tried NSAIDs for the symptoms. The treatment provided moderate relief.     Past Medical History:  Diagnosis Date  . Degenerative disc disease, lumbar    diagnosed in 2005  . Hyperlipidemia    Past Surgical History:  Procedure Laterality Date  . ABDOMINAL HYSTERECTOMY    . APPENDECTOMY    . LAPAROTOMY     Social History  Substance Use Topics  . Smoking status: Current Every Day Smoker    Packs/day: 1.00    Years: 44.00    Types: Cigarettes    Last attempt to quit: 10/30/2016  . Smokeless tobacco: Never Used  . Alcohol use No   family history includes Bipolar disorder in her paternal uncle; Cancer in her maternal grandmother, mother, and paternal grandmother; Dementia in her paternal uncle; Depression in her cousin; Heart attack in her father; Hyperlipidemia in her father; Multiple sclerosis in her sister; Schizophrenia in her cousin.  ROS: negative except as noted in the HPI  Medications: Current Outpatient Prescriptions  Medication Sig Dispense Refill  . haloperidol (HALDOL) 2 MG tablet Take 2 tablets (4 mg total) by mouth daily. 60 tablet 1  . lithium carbonate (LITHOBID) 300 MG CR tablet Take 1 tablet (300 mg total) by mouth 2 (two) times daily. 60 tablet 1  . sertraline (ZOLOFT) 100 MG tablet TAKE 2 TABLETS (200 MG TOTAL) BY MOUTH DAILY. 60 tablet 1   No current facility-administered  medications for this visit.    Allergies  Allergen Reactions  . Lipitor [Atorvastatin] Swelling       Objective:  BP 121/79   Pulse 87   Temp 98.2 F (36.8 C) (Oral)   Wt 188 lb (85.3 kg)   BMI 31.28 kg/m  Gen: well-groomed, cooperative, not ill-appearing, no distress, obese female HEENT: conjunctive and cornea clear, wearing glasses, right external canal normal, right TM clear, left external canal occluded fully with cerumen, no mastoid tenderness, trachea midline Pulm: Normal work of breathing, normal phonation Neuro: alert and oriented x 3,  no tremor MSK: extremities atraumatic, normal gait and station Lymph: no preauricular or postauricular adenopathy Skin: warm, dry, intact; no rashes on exposed skin, no cyanosis  No results found for this or any previous visit (from the past 72 hour(s)). No results found.    Assessment and Plan: 61 y.o. female with   1. Impacted cerumen of left ear Ceruminosis is noted.  Wax is removed by apply Colace for 5 minutes followed by syringing and manual debridement. Visualized TM was intact. Instructions for home care to prevent wax buildup are given.  2. Otalgia, left - acetic acid drops  tid  - follow-up in 3 days   Patient education and anticipatory guidance given Patient agrees with treatment plan Follow-up as needed if symptoms worsen or fail to improve  Darlyne Russian PA-C

## 2017-04-26 ENCOUNTER — Encounter: Payer: Self-pay | Admitting: Physician Assistant

## 2017-04-26 ENCOUNTER — Ambulatory Visit (INDEPENDENT_AMBULATORY_CARE_PROVIDER_SITE_OTHER): Payer: Medicare Other | Admitting: Physician Assistant

## 2017-04-26 VITALS — BP 120/78 | HR 92 | Wt 187.0 lb

## 2017-04-26 DIAGNOSIS — H9202 Otalgia, left ear: Secondary | ICD-10-CM | POA: Diagnosis not present

## 2017-04-26 NOTE — Progress Notes (Signed)
HPI:                                                                Amber Rice is a 61 y.o. female who presents to [facility]: [facility] today for left otalgia follow-up  Patient reports left ear pain is 80% improved. She reports a popping sensation this morning and noticed improvement in hearing and pain. She has been using acetic acid drops with relief.   Past Medical History:  Diagnosis Date  . Degenerative disc disease, lumbar    diagnosed in 2005  . Hyperlipidemia   . Impacted cerumen of left ear 04/23/2017   Past Surgical History:  Procedure Laterality Date  . ABDOMINAL HYSTERECTOMY    . APPENDECTOMY    . LAPAROTOMY     Social History  Substance Use Topics  . Smoking status: Current Every Day Smoker    Packs/day: 1.00    Years: 44.00    Types: Cigarettes    Last attempt to quit: 10/30/2016  . Smokeless tobacco: Never Used  . Alcohol use No   family history includes Bipolar disorder in her paternal uncle; Cancer in her maternal grandmother, mother, and paternal grandmother; Dementia in her paternal uncle; Depression in her cousin; Heart attack in her father; Hyperlipidemia in her father; Multiple sclerosis in her sister; Schizophrenia in her cousin.  ROS: negative except as noted in the HPI  Medications: Current Outpatient Prescriptions  Medication Sig Dispense Refill  . acetic acid (VOSOL) 2 % otic solution Place 4 drops into the left ear 3 (three) times daily. 15 mL 0  . haloperidol (HALDOL) 2 MG tablet Take 2 tablets (4 mg total) by mouth daily. 60 tablet 1  . lithium carbonate (LITHOBID) 300 MG CR tablet Take 1 tablet (300 mg total) by mouth 2 (two) times daily. 60 tablet 1  . sertraline (ZOLOFT) 100 MG tablet TAKE 2 TABLETS (200 MG TOTAL) BY MOUTH DAILY. 60 tablet 1   No current facility-administered medications for this visit.    Allergies  Allergen Reactions  . Lipitor [Atorvastatin] Swelling       Objective:   BP 120/78   Pulse 92   Wt 187 lb (84.8 kg)   BMI 31.12 kg/m  Gen: well-groomed, cooperative, not ill-appearing, no distress, obese female HEENT: normal conjunctiva, wearing glasses, right canal and TM clear, left canal with small amount of white debris/cerumen that is not obstructing, no edema or erythema of the canal, patient tolerated speculum in ear, left TM intact and clear, no mastoid tenderness  Pulm: Normal work of breathing, normal phonation Neuro: alert and oriented x 3, no tremor MSK: extremities atraumatic, normal gait and station Skin: intact, no rashes on exposed skin, no jaundice, no cyanosis   No results found for this or any previous visit (from the past 72 hour(s)). No results found.    Assessment and Plan: 61 y.o. female with   1. Acute otalgia, left - clinically improved - complete full course of acetic acid  Patient education and anticipatory guidance given Patient agrees with treatment plan Follow-up as needed if symptoms worsen or fail to improve  Darlyne Russian PA-C

## 2017-05-09 ENCOUNTER — Encounter: Payer: Self-pay | Admitting: Physician Assistant

## 2017-05-09 ENCOUNTER — Ambulatory Visit (INDEPENDENT_AMBULATORY_CARE_PROVIDER_SITE_OTHER): Payer: Medicare Other | Admitting: Physician Assistant

## 2017-05-09 ENCOUNTER — Ambulatory Visit (INDEPENDENT_AMBULATORY_CARE_PROVIDER_SITE_OTHER): Payer: Medicare Other

## 2017-05-09 VITALS — BP 126/64 | HR 95 | Ht 65.0 in | Wt 186.0 lb

## 2017-05-09 DIAGNOSIS — Z720 Tobacco use: Secondary | ICD-10-CM | POA: Diagnosis not present

## 2017-05-09 DIAGNOSIS — F17200 Nicotine dependence, unspecified, uncomplicated: Secondary | ICD-10-CM | POA: Diagnosis not present

## 2017-05-09 DIAGNOSIS — J440 Chronic obstructive pulmonary disease with acute lower respiratory infection: Secondary | ICD-10-CM

## 2017-05-09 DIAGNOSIS — R05 Cough: Secondary | ICD-10-CM

## 2017-05-09 DIAGNOSIS — J209 Acute bronchitis, unspecified: Secondary | ICD-10-CM | POA: Diagnosis not present

## 2017-05-09 MED ORDER — ALBUTEROL SULFATE HFA 108 (90 BASE) MCG/ACT IN AERS: 2.0000 | INHALATION_SPRAY | Freq: Four times a day (QID) | RESPIRATORY_TRACT | 2 refills | Status: DC | PRN

## 2017-05-09 MED ORDER — PREDNISONE 50 MG PO TABS: ORAL_TABLET | ORAL | 0 refills | Status: DC

## 2017-05-09 MED ORDER — IPRATROPIUM-ALBUTEROL 0.5-2.5 (3) MG/3ML IN SOLN
3.0000 mL | Freq: Once | RESPIRATORY_TRACT | Status: AC
  Administered 2017-05-09: 3 mL via RESPIRATORY_TRACT

## 2017-05-09 NOTE — Progress Notes (Addendum)
Subjective:    Patient ID: Amber Rice, female    DOB: 1956/07/25, 61 y.o.   MRN: [id number]  HPI The patient is a 61 year old female patient who presents today for possible bronchitis. She states that she has a constant smokers cough, but she believes her cough has become more harsh in the last two weeks. She believes that this cough is similar to when she had bronchitis in March. She reports that her cough is nonproductive and she often gags after coughing. She also reports congestion, sore throat, hoarseness and occasional difficulty swallowing. She denies chest pain, dizziness, wheezing or shortness of breath. She denies sick contacts.  She has not tried any medications for the cough.   She is a current smoker. She has smoked 3/4 of a pack per day for 46 years.   Spirometry in April 2018 suggestive of restrictive pattern. She does not have an inhaler.    .. Active Ambulatory Problems    Diagnosis Date Noted  . Hyperlipidemia 07/09/2013  . GERD (gastroesophageal reflux disease) 07/09/2013  . DDD (degenerative disc disease), lumbosacral 07/09/2013  . Bipolar 1 disorder ([place]) 07/09/2013  . Osteoarthritis of left glenohumeral joint 04/30/2014  . Left shoulder pain 03/01/2015  . Bilateral carpal tunnel syndrome 05/03/2015  . DDD (degenerative disc disease), cervical 05/03/2015  . Lumbar disc herniation with radiculopathy 05/03/2015  . Chronic SI joint pain 09/21/2015  . Leukocytosis 01/14/2016  . Thyroid activity decreased 01/14/2016  . Callus 05/08/2016  . Tobacco abuse 10/12/2016   Resolved Ambulatory Problems    Diagnosis Date Noted  . Low back pain with sciatica 07/09/2013  . Impacted cerumen of left ear 04/23/2017   Past Medical History:  Diagnosis Date  . Degenerative disc disease, lumbar   . Hyperlipidemia   . Impacted cerumen of left ear 04/23/2017     Review of Systems  All other systems reviewed and are negative.      Objective:   Physical Exam   Constitutional: She is oriented to person, place, and time. She appears well-developed and well-nourished.  HENT:  Head: Normocephalic and atraumatic.  Right Ear: External ear normal.  Left Ear: External ear normal.  Cardiovascular: Normal rate, regular rhythm and normal heart sounds.   Pulmonary/Chest: Effort normal and breath sounds normal.  Neurological: She is alert and oriented to person, place, and time.  Skin: Skin is warm and dry.  Psychiatric: She has a normal mood and affect. Her behavior is normal.          Assessment & Plan:  Marland KitchenMarland KitchenCoralie was seen today for cough.  Diagnoses and all orders for this visit:  Acute bronchitis with COPD ([place]) -     albuterol (PROVENTIL HFA;VENTOLIN HFA) 108 (90 Base) MCG/ACT inhaler; Inhale 2 puffs into the lungs every 6 (six) hours as needed for wheezing or shortness of breath. -     predniSONE (DELTASONE) 50 MG tablet; Take one tablet for 5 days. -     DG Chest 2 View -     ipratropium-albuterol (DUONEB) 0.5-2.5 (3) MG/3ML nebulizer solution 3 mL; Take 3 mLs by nebulization once.  Tobacco abuse  .Marland Kitchen Active Ambulatory Problems    Diagnosis Date Noted  . Hyperlipidemia 07/09/2013  . GERD (gastroesophageal reflux disease) 07/09/2013  . DDD (degenerative disc disease), lumbosacral 07/09/2013  . Bipolar 1 disorder ([place]) 07/09/2013  . Osteoarthritis of left glenohumeral joint 04/30/2014  . Left shoulder pain 03/01/2015  . Bilateral carpal tunnel syndrome 05/03/2015  . DDD (degenerative  disc disease), cervical 05/03/2015  . Lumbar disc herniation with radiculopathy 05/03/2015  . Chronic SI joint pain 09/21/2015  . Leukocytosis 01/14/2016  . Thyroid activity decreased 01/14/2016  . Callus 05/08/2016  . Tobacco abuse 10/12/2016   Resolved Ambulatory Problems    Diagnosis Date Noted  . Low back pain with sciatica 07/09/2013  . Impacted cerumen of left ear 04/23/2017   Past Medical History:  Diagnosis Date  . Degenerative disc  disease, lumbar   . Hyperlipidemia   . Impacted cerumen of left ear 04/23/2017   I suspect viral acute bronchitis with COPD. Duoneb breathing treatment done in office today because she is unsure if she will be able to afford the cost of the albuterol inhaler. Prescription for albuterol inhaler and prednisone sent to pharmacy. She will get a CXR today to rule out bacterial cause of bronchitis.  Follow up as needed.

## 2017-05-11 ENCOUNTER — Encounter: Payer: Self-pay | Admitting: Physician Assistant

## 2017-05-14 ENCOUNTER — Other Ambulatory Visit (HOSPITAL_COMMUNITY): Payer: Self-pay | Admitting: Psychiatry

## 2017-05-14 MED ORDER — HALOPERIDOL 2 MG PO TABS: 4.0000 mg | ORAL_TABLET | Freq: Every day | ORAL | 1 refills | Status: DC

## 2017-05-14 MED ORDER — LITHIUM CARBONATE ER 300 MG PO TBCR: 300.0000 mg | EXTENDED_RELEASE_TABLET | Freq: Two times a day (BID) | ORAL | 1 refills | Status: DC

## 2017-05-14 NOTE — Telephone Encounter (Signed)
Medication refill- received [facility] from [facility] requesting a refill for Haldol, Lithium and Zoloft. Per Dr. [name], refill request is authorize for Haldol 2mg , #60 w/ 1 refill, Lithium 300mg , #60 w/ 1 refill and Zoloft 100mg , #60 w/ 1 refill. Rx's were sent to pharmacy. Pt is schedule for a f/u apt on 07/10/17. Called and informed pt of refill status. Pt verbalizes understanding.  Nothing further is need at this time.

## 2017-05-16 ENCOUNTER — Telehealth (HOSPITAL_COMMUNITY): Payer: Self-pay | Admitting: Psychiatry

## 2017-05-16 NOTE — Telephone Encounter (Signed)
[facility] [address] [facility] not [facility] patients ref[facility] for [facility]oloft.   Please advise.

## 2017-05-17 MED ORDER — SERTRALINE HCL 100 MG PO TABS: 100.0000 mg | ORAL_TABLET | Freq: Every day | ORAL | 0 refills | Status: DC

## 2017-05-17 NOTE — Telephone Encounter (Signed)
Medication management- pt called the office requesting a refill for Zoloft. Explained to pt rx was sent to pharmacy on 05/14/17. Pt states she is out of medication Called and spoke with Ulice Dash at [facility] ([place]). Per Ulice Dash, pt's insurance requires a 90 day supply for Zoloft before the insurance will cover the cost.  Pt is schedule for a follow up apt on 07/10/17.   Per Dr. [name], rx is authorize for a 90 day supply. Rx was sent  to pharmacy. Nothing further is need at this time.

## 2017-05-23 ENCOUNTER — Telehealth (HOSPITAL_COMMUNITY): Payer: Self-pay | Admitting: Psychiatry

## 2017-05-23 NOTE — Telephone Encounter (Signed)
Received fax from [facility] [address] 90 day refill request for lithium carb tab 300 MG ER quanity 180 Take 1 tab by mouth 2x daily.   Please advise.

## 2017-05-24 NOTE — Telephone Encounter (Signed)
Can send 30 days supply not 90. Need to discuss with patient next visit. Avoid 90 days supply on psychtopics as it is risky

## 2017-05-25 NOTE — Telephone Encounter (Signed)
Informed patient of the following. Refill not yet due.  Patient showed understanding. Nothing further needed at this time.

## 2017-06-11 ENCOUNTER — Other Ambulatory Visit (HOSPITAL_COMMUNITY): Payer: Self-pay | Admitting: Psychiatry

## 2017-06-20 ENCOUNTER — Telehealth (HOSPITAL_COMMUNITY): Payer: Self-pay | Admitting: *Deleted

## 2017-06-20 MED ORDER — SERTRALINE HCL 100 MG PO TABS: 100.0000 mg | ORAL_TABLET | Freq: Two times a day (BID) | ORAL | 0 refills | Status: DC

## 2017-06-20 NOTE — Telephone Encounter (Signed)
Medication refill- [facility] clarification for Zoloft rx. Per Dr. [name], pt is currently taking Zoloft 100mg , BID. A new rx was sent to pharmacy. Nothing further [place] need at this time.

## 2017-06-20 NOTE — Telephone Encounter (Signed)
Medication refill- received refill request for Lithium. Per Dr. [name], refill request is denied. Rx was sent to pharmacy on 05/14/17 w/ 1 refill. Pt is schedule for a f/u apt on 07/10/17. Nothing further is need at this time.

## 2017-07-10 ENCOUNTER — Encounter (HOSPITAL_COMMUNITY): Payer: Self-pay | Admitting: Psychiatry

## 2017-07-10 ENCOUNTER — Ambulatory Visit (INDEPENDENT_AMBULATORY_CARE_PROVIDER_SITE_OTHER): Payer: Medicare Other | Admitting: Psychiatry

## 2017-07-10 VITALS — BP 118/72 | HR 100 | Ht 65.0 in | Wt 186.0 lb

## 2017-07-10 DIAGNOSIS — F315 Bipolar disorder, current episode depressed, severe, with psychotic features: Secondary | ICD-10-CM

## 2017-07-10 DIAGNOSIS — G259 Extrapyramidal and movement disorder, unspecified: Secondary | ICD-10-CM | POA: Diagnosis not present

## 2017-07-10 DIAGNOSIS — F4322 Adjustment disorder with anxiety: Secondary | ICD-10-CM

## 2017-07-10 DIAGNOSIS — Z79899 Other long term (current) drug therapy: Secondary | ICD-10-CM

## 2017-07-10 DIAGNOSIS — G4700 Insomnia, unspecified: Secondary | ICD-10-CM | POA: Diagnosis not present

## 2017-07-10 DIAGNOSIS — M25519 Pain in unspecified shoulder: Secondary | ICD-10-CM

## 2017-07-10 DIAGNOSIS — G8929 Other chronic pain: Secondary | ICD-10-CM

## 2017-07-10 DIAGNOSIS — F3130 Bipolar disorder, current episode depressed, mild or moderate severity, unspecified: Secondary | ICD-10-CM

## 2017-07-10 DIAGNOSIS — F22 Delusional disorders: Secondary | ICD-10-CM

## 2017-07-10 MED ORDER — HALOPERIDOL 2 MG PO TABS: 4.0000 mg | ORAL_TABLET | Freq: Every day | ORAL | 1 refills | Status: DC

## 2017-07-10 MED ORDER — LITHIUM CARBONATE ER 300 MG PO TBCR: 300.0000 mg | EXTENDED_RELEASE_TABLET | Freq: Two times a day (BID) | ORAL | 1 refills | Status: DC

## 2017-07-10 NOTE — Progress Notes (Signed)
Patient ID: Amber Rice, female   DOB: [birth year], 61 y.o.   MRN: [id number] [place] [id number] 61 y.o.  07/10/2017  Chief Complaint: Follow up bipolar disorder   History of Present Illness:   Patient returns for Medication Follow up and is diagnosed with bipolar disorder depressed. Anxiety disorder NOS. Insomnia  Patient remains baseline medication is helping some loneliness due to being a widow affects her mood and wintertime    Shoulder pain; effects mood No increase in tremors     Duration of bipolar : 9 years or more   Depression scale: 8/10  Modifying factors; walking.     Past Medical History:  Diagnosis Date  . Degenerative disc disease, lumbar    diagnosed in 2005  . Hyperlipidemia   . Impacted cerumen of left ear 04/23/2017   Family History  Problem Relation Age of Onset  . Cancer Mother   . Heart attack Father   . Hyperlipidemia Father   . Cancer Maternal Grandmother   . Cancer Paternal Grandmother   . Bipolar disorder Paternal Uncle        Committed suicide in his 59's  . Dementia Paternal Uncle   . Depression Cousin   . Schizophrenia Cousin   . Multiple sclerosis Sister   . Alcohol abuse Neg Hx   . Anxiety disorder Neg Hx     Outpatient Encounter Medications as of 07/10/2017  Medication Sig  . albuterol (PROVENTIL HFA;VENTOLIN HFA) 108 (90 Base) MCG/ACT inhaler Inhale 2 puffs into the lungs every 6 (six) hours as needed for wheezing or shortness of breath.  . haloperidol (HALDOL) 2 MG tablet Take 2 tablets (4 mg total) by mouth daily.  Marland Kitchen lithium carbonate (LITHOBID) 300 MG CR tablet Take 1 tablet (300 mg total) by mouth 2 (two) times daily.  . sertraline (ZOLOFT) 100 MG tablet Take 1 tablet (100 mg total) 2 (two) times daily by mouth.  . [DISCONTINUED] haloperidol (HALDOL) 2 MG tablet Take 2 tablets (4 mg total) by mouth daily.  . [DISCONTINUED] lithium carbonate (LITHOBID) 300 MG CR  tablet Take 1 tablet (300 mg total) by mouth 2 (two) times daily.  . predniSONE (DELTASONE) 50 MG tablet Take one tablet for 5 days. (Patient not taking: Reported on 07/10/2017)   No facility-administered encounter medications on file as of 07/10/2017.     No results found for this or any previous visit (from the past 2160 hour(s)).  BP 118/72 (BP Location: Left Arm, Patient Position: Sitting, Cuff Size: Normal)   Pulse 100   Ht 5\' 5"  (1.651 m)   Wt 186 lb (84.4 kg)   BMI 30.95 kg/m    Review of Systems  Cardiovascular: Negative for chest pain.  Gastrointestinal: Negative for nausea.  Neurological: Negative for headaches.  Psychiatric/Behavioral: Negative for substance abuse and suicidal ideas.    Mental Status Examination  Appearance: casual Alert: Yes Attention: fair  Cooperative: Yes Eye Contact: Good Speech: normal Psychomotor Activity: Decreased Memory/Concentration: adequate Oriented: person, place, time/date and situation Mood: fair Affect: reactive pleasant Thought Processes and Associations: Coherent no paranoia as of now [employer] of Knowledge: Fair Thought Content: Suicidal ideation and Homicidal ideation were denied Insight: Fair Judgement: Fair  Diagnosis: Bipolar disorder type I manic per history for psychotic features. Insomnia NOS. Adjustment disorder with anxiety . EPS Treatment Plan:   1. Bipolar: depression :baseline. Some more during winter. Continue lithium but does not want to do blood work for now. Continue haldol  2. Depression:not worse. Continue zoloft and meds  Paranoia: not worse. Continue haldol  3. Insomnia: not worse. Reviewed sleep hygiene.  Questions reviewed. Prescriptions sent. FU 4 months  [name], MD

## 2017-07-17 ENCOUNTER — Other Ambulatory Visit (HOSPITAL_COMMUNITY): Payer: Self-pay | Admitting: Psychiatry

## 2017-07-26 ENCOUNTER — Other Ambulatory Visit (HOSPITAL_COMMUNITY): Payer: Self-pay | Admitting: Psychiatry

## 2017-07-26 MED ORDER — HALOPERIDOL 2 MG PO TABS: 4.0000 mg | ORAL_TABLET | Freq: Every day | ORAL | 1 refills | Status: DC

## 2017-07-26 NOTE — Telephone Encounter (Signed)
Haldol 2mg  was resent to [facility]. Patient notified. Nothing further is needed at this time.

## 2017-07-26 NOTE — Telephone Encounter (Signed)
[facility]  Pt would like [place] to call in rx to [facility] on [address].

## 2017-08-27 ENCOUNTER — Telehealth (HOSPITAL_COMMUNITY): Payer: Self-pay

## 2017-08-27 ENCOUNTER — Other Ambulatory Visit (HOSPITAL_COMMUNITY): Payer: Self-pay

## 2017-08-27 MED ORDER — HALOPERIDOL 2 MG PO TABS: 4.0000 mg | ORAL_TABLET | Freq: Every day | ORAL | 0 refills | Status: DC

## 2017-08-27 NOTE — Telephone Encounter (Signed)
Pharmacy sent over a fax requesting a 90 day supply of Haloperidol 2 mg. I sent over the 90 day supply. Patient has an appointment on 10/08/17. Nothing further is needed at this time.

## 2017-08-29 ENCOUNTER — Other Ambulatory Visit (HOSPITAL_COMMUNITY): Payer: Self-pay | Admitting: Psychiatry

## 2017-09-26 ENCOUNTER — Other Ambulatory Visit (HOSPITAL_COMMUNITY): Payer: Self-pay

## 2017-09-26 ENCOUNTER — Telehealth (HOSPITAL_COMMUNITY): Payer: Self-pay

## 2017-09-26 MED ORDER — SERTRALINE HCL 100 MG PO TABS: 100.0000 mg | ORAL_TABLET | Freq: Two times a day (BID) | ORAL | 0 refills | Status: DC

## 2017-09-26 NOTE — Telephone Encounter (Signed)
Pharmacy sent fax requesting a 90 day refill on Sertraline HCL 100 mg. Sent over the 90 day refill. Patients' next appointment is on 11/06/17. Nothing further is needed at this time.

## 2017-10-15 ENCOUNTER — Other Ambulatory Visit (HOSPITAL_COMMUNITY): Payer: Self-pay | Admitting: Psychiatry

## 2017-10-15 ENCOUNTER — Telehealth (HOSPITAL_COMMUNITY): Payer: Self-pay

## 2017-10-15 ENCOUNTER — Other Ambulatory Visit (HOSPITAL_COMMUNITY): Payer: Self-pay

## 2017-10-15 MED ORDER — LITHIUM CARBONATE ER 300 MG PO TBCR: 300.0000 mg | EXTENDED_RELEASE_TABLET | Freq: Two times a day (BID) | ORAL | 0 refills | Status: DC

## 2017-10-15 NOTE — Telephone Encounter (Signed)
Pharmacy sent fax requesting a 90 day supply of Lithium Carb. ER 300mg . Sent over a 90 day supply per Dr. [name]. Patient has upcoming appointment on 11/06/17. Nothing further is needed at this time.

## 2017-11-06 ENCOUNTER — Ambulatory Visit (INDEPENDENT_AMBULATORY_CARE_PROVIDER_SITE_OTHER): Payer: Medicare Other | Admitting: Psychiatry

## 2017-11-06 ENCOUNTER — Encounter (HOSPITAL_COMMUNITY): Payer: Self-pay | Admitting: Psychiatry

## 2017-11-06 ENCOUNTER — Other Ambulatory Visit: Payer: Self-pay

## 2017-11-06 VITALS — BP 124/84 | HR 75 | Ht 65.0 in | Wt 189.0 lb

## 2017-11-06 DIAGNOSIS — F315 Bipolar disorder, current episode depressed, severe, with psychotic features: Secondary | ICD-10-CM

## 2017-11-06 DIAGNOSIS — G259 Extrapyramidal and movement disorder, unspecified: Secondary | ICD-10-CM

## 2017-11-06 DIAGNOSIS — G8929 Other chronic pain: Secondary | ICD-10-CM

## 2017-11-06 DIAGNOSIS — M549 Dorsalgia, unspecified: Secondary | ICD-10-CM

## 2017-11-06 DIAGNOSIS — Z818 Family history of other mental and behavioral disorders: Secondary | ICD-10-CM | POA: Diagnosis not present

## 2017-11-06 DIAGNOSIS — F5102 Adjustment insomnia: Secondary | ICD-10-CM

## 2017-11-06 MED ORDER — HALOPERIDOL 2 MG PO TABS: 4.0000 mg | ORAL_TABLET | Freq: Every day | ORAL | 0 refills | Status: DC

## 2017-11-06 NOTE — Progress Notes (Signed)
Patient ID: Amber Rice, female   DOB: 1956/02/26, 62 y.o.   MRN: [id number] [place] [id number] 62 y.o.  11/06/2017  Chief Complaint: Follow up bipolar disorder   History of Present Illness:   Patient returns for Medication Follow up and is diagnosed with bipolar disorder depressed. Anxiety disorder NOS. Insomnia  Says meds help depression but pain effecting mood. Says will follow with primary care for Injection to help pain Sleep irregular. Decrease interest and energy due to pain  No worsening of side effects  haldol and lithium keep balance of mood. Does not show interest to get lithium levels.   Shoulder pain; effects mood      Duration of bipolar : 12 years   Depression scale: 7/10. 10 being no depression  Modifying factors; walking when she can     Past Medical History:  Diagnosis Date  . Degenerative disc disease, lumbar    diagnosed in 2005  . Hyperlipidemia   . Impacted cerumen of left ear 04/23/2017   Family History  Problem Relation Age of Onset  . Cancer Mother   . Heart attack Father   . Hyperlipidemia Father   . Cancer Maternal Grandmother   . Cancer Paternal Grandmother   . Bipolar disorder Paternal Uncle        Committed suicide in his 4's  . Dementia Paternal Uncle   . Depression Cousin   . Schizophrenia Cousin   . Multiple sclerosis Sister   . Alcohol abuse Neg Hx   . Anxiety disorder Neg Hx     Outpatient Encounter Medications as of 11/06/2017  Medication Sig  . albuterol (PROVENTIL HFA;VENTOLIN HFA) 108 (90 Base) MCG/ACT inhaler Inhale 2 puffs into the lungs every 6 (six) hours as needed for wheezing or shortness of breath.  . haloperidol (HALDOL) 2 MG tablet Take 2 tablets (4 mg total) by mouth daily.  Marland Kitchen lithium carbonate (LITHOBID) 300 MG CR tablet Take 1 tablet (300 mg total) by mouth 2 (two) times daily.  . sertraline (ZOLOFT) 100 MG tablet Take 1 tablet (100 mg total) by mouth  2 (two) times daily.  . [DISCONTINUED] haloperidol (HALDOL) 2 MG tablet Take 2 tablets (4 mg total) by mouth daily.  . predniSONE (DELTASONE) 50 MG tablet Take one tablet for 5 days. (Patient not taking: Reported on 07/10/2017)   No facility-administered encounter medications on file as of 11/06/2017.     No results found for this or any previous visit (from the past 2160 hour(s)).  BP 124/84 (BP Location: Left Arm, Patient Position: Sitting, Cuff Size: Normal)   Pulse 75   Ht 5\' 5"  (1.651 m)   Wt 189 lb (85.7 kg)   BMI 31.45 kg/m    Review of Systems  Cardiovascular: Negative for chest pain.  Gastrointestinal: Negative for nausea.  Musculoskeletal: Positive for back pain and myalgias.  Neurological: Negative for headaches.  Psychiatric/Behavioral: Negative for substance abuse and suicidal ideas.    Mental Status Examination  Appearance: casual Alert: Yes Attention: fair  Cooperative: Yes Eye Contact: Good Speech: normal Psychomotor Activity: Decreased Memory/Concentration: adequate Oriented: person, place, time/date and situation Mood: subdued due to pain Affect: iotherwise reactive  Thought Processes and Associations: Coherent no paranoia as of now [employer] of Knowledge: Fair Thought Content: Suicidal ideation and Homicidal ideation were denied Insight: Fair Judgement: Fair  Diagnosis: Bipolar disorder type I manic per history for psychotic features. Insomnia NOS. Adjustment disorder with anxiety . EPS Treatment Plan:  1. Bipolar: depression :not worse. Pain effects mood. Does not want to change mood stabilizer, will continue lithium, haldol   2. Depression:at times blah. Continue zoloft. Add activities.   Paranoia: not worse. contninue haldol 3. Insomnia: irregular sleep . Reviewed sleep hygiene Questions reviewed. Fu 3 m or early .  [name], MD

## 2017-11-07 ENCOUNTER — Ambulatory Visit (INDEPENDENT_AMBULATORY_CARE_PROVIDER_SITE_OTHER): Payer: Medicare Other | Admitting: Family Medicine

## 2017-11-07 ENCOUNTER — Ambulatory Visit (INDEPENDENT_AMBULATORY_CARE_PROVIDER_SITE_OTHER): Payer: Medicare Other

## 2017-11-07 ENCOUNTER — Encounter: Payer: Self-pay | Admitting: Family Medicine

## 2017-11-07 VITALS — BP 123/79 | HR 85 | Ht 65.0 in | Wt 190.0 lb

## 2017-11-07 DIAGNOSIS — G8929 Other chronic pain: Secondary | ICD-10-CM | POA: Diagnosis not present

## 2017-11-07 DIAGNOSIS — Z1211 Encounter for screening for malignant neoplasm of colon: Secondary | ICD-10-CM | POA: Diagnosis not present

## 2017-11-07 DIAGNOSIS — M533 Sacrococcygeal disorders, not elsewhere classified: Secondary | ICD-10-CM

## 2017-11-07 DIAGNOSIS — M25571 Pain in right ankle and joints of right foot: Secondary | ICD-10-CM | POA: Diagnosis not present

## 2017-11-07 MED ORDER — DICLOFENAC SODIUM 1 % TD GEL
4.0000 g | Freq: Four times a day (QID) | TRANSDERMAL | 11 refills | Status: DC
Start: 2017-11-07 — End: 2018-12-19

## 2017-11-07 NOTE — Progress Notes (Signed)
Amber Rice is a 62 y.o. female who presents to [facility] today for back pain and ankle pain and stiffness.  Amber Rice presents to clinic today for bilateral lower back pain.  I last saw her for this in February 2017 where she received bilateral SI joint injections which worked well.  In the interval she was managed with pain management and had facet injections and ablations which did not provide very good pain relief.  She denies significant radiating pain weakness or numbness.  She notes the pain is been worsening recently and she is interested in repeat injection if possible.  Additionally she notes right ankle pain and stiffness ongoing now for a few month.  She denies any injury.  She notes the pain is mostly lateral and not associated swelling.  She is tried some over-the-counter medications for pain which is helped.     Past Medical History:  Diagnosis Date  . Degenerative disc disease, lumbar    diagnosed in 2005  . Hyperlipidemia   . Impacted cerumen of left ear 04/23/2017   Past Surgical History:  Procedure Laterality Date  . ABDOMINAL HYSTERECTOMY    . APPENDECTOMY    . LAPAROTOMY     Social History   Tobacco Use  . Smoking status: Current Every Day Smoker    Packs/day: 0.50    Years: 44.00    Pack years: 22.00    Types: Cigarettes    Last attempt to quit: 10/30/2016    Years since quitting: 1.0  . Smokeless tobacco: Never Used  Substance Use Topics  . Alcohol use: No     ROS:  As above   Medications: Current Outpatient Medications  Medication Sig Dispense Refill  . albuterol (PROVENTIL HFA;VENTOLIN HFA) 108 (90 Base) MCG/ACT inhaler Inhale 2 puffs into the lungs every 6 (six) hours as needed for wheezing or shortness of breath. 1 Inhaler 2  . haloperidol (HALDOL) 2 MG tablet Take 2 tablets (4 mg total) by mouth daily. 180 tablet 0  . lithium carbonate (LITHOBID) 300 MG CR tablet Take 1 tablet (300 mg total)  by mouth 2 (two) times daily. 180 tablet 0  . sertraline (ZOLOFT) 100 MG tablet Take 1 tablet (100 mg total) by mouth 2 (two) times daily. 180 tablet 0  . diclofenac sodium (VOLTAREN) 1 % GEL Apply 4 g topically 4 (four) times daily. To affected joint. 100 g 11   No current facility-administered medications for this visit.    Allergies  Allergen Reactions  . Lipitor [Atorvastatin] Swelling     Exam:  BP 123/79   Pulse 85   Ht 5\' 5"  (1.651 m)   Wt 190 lb (86.2 kg)   SpO2 98%   BMI 31.62 kg/m  General: Well Developed, well nourished, and in no acute distress.  Neuro/Psych: Alert and oriented x3, extra-ocular muscles intact, able to move all 4 extremities, sensation grossly intact. Skin: Warm and dry, no rashes noted.  Respiratory: Not using accessory muscles, speaking in full sentences, trachea midline.  Cardiovascular: Pulses palpable, no extremity edema. Abdomen: Does not appear distended. MSK:  L-spine nontender to midline.  Tender palpation bilateral SI joints. Normal spinal motion and gait.  Right ankle:  No effusion.  No skin change. Normal motion. Tender to palpation right ATFL region and across the anterior aspect of the ankle Stable ligamentous exam. Intact strength. Pulses capillary refill and sensation are intact.  Procedure: Real-time Ultrasound Guided Injection of right SI joint  Device:  GE Logiq E  Images permanently stored and available for review in the ultrasound unit. Verbal informed consent obtained. Discussed risks and benefits of procedure. Warned about infection bleeding damage to structures skin hypopigmentation and fat atrophy among others. Patient expresses understanding and agreement Time-out conducted.  Noted no overlying erythema, induration, or other signs of local infection.  Skin prepped in a sterile fashion.  Local anesthesia: Topical Ethyl chloride.  With sterile technique and under real time ultrasound guidance: 80mg  depomedrol and  4 mL Marcaine injected easily.  Completed without difficulty  Pain in the SI joint area immediately resolved suggesting accurate placement of the medication.  Advised to call if fevers/chills, erythema, induration, drainage, or persistent bleeding.  Images permanently stored and available for review in the ultrasound unit.  Impression: Technically successful ultrasound guided injection.   Procedure: Real-time Ultrasound Guided Injection of left SI joint  Device: GE Logiq E  Images permanently stored and available for review in the ultrasound unit. Verbal informed consent obtained. Discussed risks and benefits of procedure. Warned about infection bleeding damage to structures skin hypopigmentation and fat atrophy among others. Patient expresses understanding and agreement Time-out conducted.  Noted no overlying erythema, induration, or other signs of local infection.  Skin prepped in a sterile fashion.  Local anesthesia: Topical Ethyl chloride.  With sterile technique and under real time ultrasound guidance: 480 mg depomedrol  and 4 mL of Marcaine injected easily.  Completed without difficulty  Pain in the SI joint immediately resolved suggesting accurate placement of the medication.  Advised to call if fevers/chills, erythema, induration, drainage, or persistent bleeding.  Images permanently stored and available for review in the ultrasound unit.  Impression: Technically successful ultrasound guided injection.   MRI lumbar spine images dated October 2016 reviewed   My personal interpretation of the right ankle x-rays obtained today reveal mild degenerative changes at the lateral aspect of the ankle joint.  No acute findings.  Await formal radiology review.     Assessment and Plan: 62 y.o. female with  Back pain: SI joint dysfunction.  Status post injection.  This worked well previously.  Continue home exercise program  Ankle pain: Degenerative changes present.   Plan for trial of diclofenac gel.  Recheck in a few weeks.  Next step would be injection versus physical therapy.  Of maintenance: Patient due for colon cancer screening.  After discussion referral was made to digestive health specialist for colonoscopy. Patient due for HIV and hepatitis C screening with next labs. Patient declined flu and pneumococcal vaccines.   Orders Placed This Encounter  Procedures  . DG Ankle Complete Right    Standing Status:   Future    Number of Occurrences:   1    Standing Expiration Date:   01/08/2019    Order Specific Question:   Reason for Exam (SYMPTOM  OR DIAGNOSIS REQUIRED)    Answer:   eval ankle pain    Order Specific Question:   Preferred imaging location?    Answer:   [facility]    Order Specific Question:   Radiology Contrast Protocol - do NOT remove file path    Answer:   [email or web address]  . Ambulatory referral to Gastroenterology    Referral Priority:   Routine    Referral Type:   Consultation    Referral Reason:   Specialty Services Required    Number of Visits Requested:   1   Meds ordered this encounter  Medications  . diclofenac sodium (VOLTAREN) 1 %  GEL    Sig: Apply 4 g topically 4 (four) times daily. To affected joint.    Dispense:  100 g    Refill:  11    Discussed warning signs or symptoms. Please see discharge instructions. Patient expresses understanding.

## 2017-11-07 NOTE — Patient Instructions (Signed)
Thank you for coming in today. Call or go to the ER if you develop a large red swollen joint with extreme pain or oozing puss.  Recheck in 3-4 weeks for ankle and back.  Get xray now.  Use the gel on the ankle 4x daily.  Recheck sooner if needed.,    You should hear from Digestive health about the colonoscopy scheduling.

## 2017-12-05 ENCOUNTER — Ambulatory Visit: Payer: Self-pay | Admitting: Family Medicine

## 2017-12-10 ENCOUNTER — Telehealth (HOSPITAL_COMMUNITY): Payer: Self-pay | Admitting: Psychiatry

## 2017-12-10 MED ORDER — RISPERIDONE 1 MG PO TABS: 1.0000 mg | ORAL_TABLET | Freq: Every day | ORAL | 0 refills | Status: DC

## 2017-12-10 NOTE — Telephone Encounter (Signed)
Crystal can you call her . We can change to risperdal 1mg  qhs (half tablet ) or during the day. If tolerates it ok then can increase to one tablet . It may cause sedation If patient agrees we can send

## 2017-12-10 NOTE — Telephone Encounter (Signed)
Patient is willing to try Risperdal. Informed her to take 1/2 tab for a few days then increase if she tolerates it. Sent medication to the pharmacy per Dr. [name].

## 2017-12-10 NOTE — Telephone Encounter (Signed)
Pt has called 7 different pharmacy to get haldol rx. They are all out. Pt has been off this medication since the 21st.  Is there something else we can call in?  cb # [phone]

## 2017-12-13 DIAGNOSIS — M542 Cervicalgia: Secondary | ICD-10-CM | POA: Diagnosis not present

## 2017-12-13 DIAGNOSIS — M25512 Pain in left shoulder: Secondary | ICD-10-CM | POA: Diagnosis not present

## 2017-12-13 DIAGNOSIS — M5412 Radiculopathy, cervical region: Secondary | ICD-10-CM | POA: Diagnosis not present

## 2017-12-14 ENCOUNTER — Other Ambulatory Visit: Payer: Self-pay | Admitting: Physician Assistant

## 2017-12-14 DIAGNOSIS — M542 Cervicalgia: Secondary | ICD-10-CM

## 2017-12-19 ENCOUNTER — Other Ambulatory Visit (HOSPITAL_COMMUNITY): Payer: Self-pay | Admitting: Psychiatry

## 2017-12-24 ENCOUNTER — Other Ambulatory Visit: Payer: Self-pay

## 2018-01-01 ENCOUNTER — Other Ambulatory Visit (HOSPITAL_COMMUNITY): Payer: Self-pay | Admitting: Psychiatry

## 2018-01-02 DIAGNOSIS — D123 Benign neoplasm of transverse colon: Secondary | ICD-10-CM | POA: Diagnosis not present

## 2018-01-02 DIAGNOSIS — D128 Benign neoplasm of rectum: Secondary | ICD-10-CM | POA: Diagnosis not present

## 2018-01-02 DIAGNOSIS — K621 Rectal polyp: Secondary | ICD-10-CM | POA: Diagnosis not present

## 2018-01-02 DIAGNOSIS — Z1211 Encounter for screening for malignant neoplasm of colon: Secondary | ICD-10-CM | POA: Diagnosis not present

## 2018-01-02 DIAGNOSIS — D122 Benign neoplasm of ascending colon: Secondary | ICD-10-CM | POA: Diagnosis not present

## 2018-01-02 DIAGNOSIS — K635 Polyp of colon: Secondary | ICD-10-CM | POA: Diagnosis not present

## 2018-01-02 LAB — HM COLONOSCOPY

## 2018-01-04 ENCOUNTER — Other Ambulatory Visit: Payer: Self-pay | Admitting: Physician Assistant

## 2018-01-04 DIAGNOSIS — K635 Polyp of colon: Secondary | ICD-10-CM | POA: Insufficient documentation

## 2018-01-04 DIAGNOSIS — D126 Benign neoplasm of colon, unspecified: Secondary | ICD-10-CM

## 2018-01-07 ENCOUNTER — Other Ambulatory Visit (HOSPITAL_COMMUNITY): Payer: Self-pay | Admitting: Psychiatry

## 2018-01-14 ENCOUNTER — Encounter: Payer: Self-pay | Admitting: Physician Assistant

## 2018-01-22 ENCOUNTER — Telehealth (HOSPITAL_COMMUNITY): Payer: Self-pay | Admitting: Psychiatry

## 2018-01-22 NOTE — Telephone Encounter (Signed)
Bernell List, Dr. [name] is out of the office this week. Is there anything we can do for this patient?

## 2018-01-22 NOTE — Telephone Encounter (Signed)
Pt states Risperdal is making her have side effects. Including not being able to swallow well and being very sleepy.  Pt decided to not take the medication today.   Please advise.   CB # [phone]

## 2018-01-23 ENCOUNTER — Other Ambulatory Visit (HOSPITAL_COMMUNITY): Payer: Self-pay | Admitting: Psychiatry

## 2018-01-23 NOTE — Telephone Encounter (Signed)
The swallowing issue seems concerning, there is a rare reaction of laryngospasm that can occur - is she having difficulty with speech or speaking or feeling like she has a frog in her throat?  If so, I would suggest she take benadryl 50 mg immediately even though it would make her sleepy, and call back in an hour to update [place].  If it worsens, she needs to be seen in urgent care or ER

## 2018-01-23 NOTE — Telephone Encounter (Signed)
Informed patient per Dr. Adele Schilder to stop taking Risperdal. I asked her if she wants to try Seroquel. Patient does not want to try Seroquel at this time, she wants to wait to talk to Dr. [name] next week. Patient states she has stopped taking Risperdal.

## 2018-01-23 NOTE — Telephone Encounter (Signed)
She can try Seroquel 50-100 mg at bedtime.  Discontinue Risperdal.

## 2018-01-23 NOTE — Telephone Encounter (Signed)
Left a VM informing patient of everything Dr. Daron Offer stated in previous message about taking benadryl 50mg , calling back to update [place], and use ER or urgent care if systems worsens.

## 2018-01-28 ENCOUNTER — Other Ambulatory Visit (HOSPITAL_COMMUNITY): Payer: Self-pay

## 2018-01-28 ENCOUNTER — Telehealth: Payer: Self-pay

## 2018-01-28 ENCOUNTER — Other Ambulatory Visit: Payer: Self-pay

## 2018-01-28 ENCOUNTER — Emergency Department (INDEPENDENT_AMBULATORY_CARE_PROVIDER_SITE_OTHER)
Admission: EM | Admit: 2018-01-28 | Discharge: 2018-01-28 | Disposition: A | Discharge status: Home / Self Care | Payer: Medicare Other | Source: Home / Self Care | Attending: Family Medicine | Admitting: Family Medicine

## 2018-01-28 ENCOUNTER — Encounter: Payer: Self-pay | Admitting: Emergency Medicine

## 2018-01-28 DIAGNOSIS — R110 Nausea: Secondary | ICD-10-CM | POA: Diagnosis not present

## 2018-01-28 MED ORDER — ONDANSETRON HCL 4 MG PO TABS: 4.0000 mg | ORAL_TABLET | Freq: Four times a day (QID) | ORAL | 0 refills | Status: DC

## 2018-01-28 MED ORDER — QUETIAPINE FUMARATE 50 MG PO TABS: 50.0000 mg | ORAL_TABLET | Freq: Every day | ORAL | 0 refills | Status: DC

## 2018-01-28 NOTE — Telephone Encounter (Signed)
Left pt msg of Jade's recommendations. Advised pt to call back to schedule follow up

## 2018-01-28 NOTE — Telephone Encounter (Signed)
Pt went to UC and I consulted with Leeroy Cha PA-C and is going to give her zofran as needed. Consider cutting back seroquel to 25mg  daily which would be half and then increasing back up when nausea resolves. Follow up with adkar or myself.

## 2018-01-28 NOTE — Telephone Encounter (Signed)
Pt called wanting to know if [name] could call her in som medication for nausea.   She feels her Seroquel is making her nauseased. She has not had any instances of vomiting.   Seroquel is given by Dr [name] and I advised pt to call them for nausea meds. Pt states she already called, and they advised her to call her PCP   Please advise.   Thanks!

## 2018-01-28 NOTE — ED Triage Notes (Signed)
Nausea x 8 days. Patient states she has been taking Haldol for 10 years and it has been discontinued, Psych changed to Risperdol which made her nauseated and changes to [facility] and she is still experiencing nausea. Psych is on vacation PCP triage referred her to UC.

## 2018-01-28 NOTE — ED Provider Notes (Signed)
[facility]    CSN: [id number] Arrival date & time: 01/28/18  1528     History   Chief Complaint Chief Complaint  Patient presents with  . Nausea    HPI Amber Rice is a 62 y.o. female.   HPI Amber Rice is a 62 y.o. female presenting to UC with c/o nausea without vomiting for the last 8 days. She reports being on Haldol for 10 years but it was discontinued. She was switched to Risperdol but had stomach upset with severe nausea. She was most recently changed to [facility] about 1 week ago and still having nausea. Her psychiatrist, Dr. [name], is out of town at this time.  Denies vomiting or diarrhea. No rashes, trouble breathing or other side effects from the medication.   Past Medical History:  Diagnosis Date  . Degenerative disc disease, lumbar    diagnosed in 2005  . Hyperlipidemia   . Impacted cerumen of left ear 04/23/2017    Patient Active Problem List   Diagnosis Date Noted  . Colon polyp 01/04/2018  . Tobacco abuse 10/12/2016  . Callus 05/08/2016  . Leukocytosis 01/14/2016  . Thyroid activity decreased 01/14/2016  . Chronic SI joint pain 09/21/2015  . Bilateral carpal tunnel syndrome 05/03/2015  . DDD (degenerative disc disease), cervical 05/03/2015  . Lumbar disc herniation with radiculopathy 05/03/2015  . Left shoulder pain 03/01/2015  . Osteoarthritis of left glenohumeral joint 04/30/2014  . Hyperlipidemia 07/09/2013  . GERD (gastroesophageal reflux disease) 07/09/2013  . DDD (degenerative disc disease), lumbosacral 07/09/2013  . Bipolar 1 disorder ([place]) 07/09/2013    Past Surgical History:  Procedure Laterality Date  . ABDOMINAL HYSTERECTOMY    . APPENDECTOMY    . LAPAROTOMY      OB History   None      Home Medications    Prior to Admission medications   Medication Sig Start Date End Date Taking? Authorizing Provider  albuterol (PROVENTIL HFA;VENTOLIN HFA) 108 (90 Base) MCG/ACT inhaler Inhale 2 puffs into the lungs every 6  (six) hours as needed for wheezing or shortness of breath. 05/09/17   Breeback, Jade L, PA-C  diclofenac sodium (VOLTAREN) 1 % GEL Apply 4 g topically 4 (four) times daily. To affected joint. 11/07/17   Gregor Hams, MD  lithium carbonate (LITHOBID) 300 MG CR tablet TAKE 1 TABLET (300 MG TOTAL) BY MOUTH 2 (TWO) TIMES DAILY. 01/08/18   [name], MD  ondansetron (ZOFRAN) 4 MG tablet Take 1 tablet (4 mg total) by mouth every 6 (six) hours. 01/28/18   Noe Gens, PA-C  QUEtiapine (SEROQUEL) 50 MG tablet Take 1 tablet (50 mg total) by mouth at bedtime. 01/28/18 01/28/19  Arfeen, Arlyce Harman, MD  risperiDONE (RISPERDAL) 1 MG tablet TAKE 1/2 TABLET BY MOUTH AT BEDTIME FOR A FEW DAYS THEN INCREASE TO 1 TABLET DAILY 01/02/18   [name], MD  sertraline (ZOLOFT) 100 MG tablet TAKE 1 TABLET BY MOUTH TWICE A DAY 12/19/17   [name], MD    Family History Family History  Problem Relation Age of Onset  . Cancer Mother   . Heart attack Father   . Hyperlipidemia Father   . Cancer Maternal Grandmother   . Cancer Paternal Grandmother   . Bipolar disorder Paternal Uncle        Committed suicide in his 60's  . Dementia Paternal Uncle   . Depression Cousin   . Schizophrenia Cousin   . Multiple sclerosis Sister   . Alcohol abuse Neg  Hx   . Anxiety disorder Neg Hx     Social History Social History   Tobacco Use  . Smoking status: Current Every Day Smoker    Packs/day: 0.50    Years: 44.00    Pack years: 22.00    Types: Cigarettes    Last attempt to quit: 10/30/2016    Years since quitting: 1.2  . Smokeless tobacco: Never Used  Substance Use Topics  . Alcohol use: No  . Drug use: No    Comment: Caffeine: Coffee 1-2 cups  per day. 24 Ounce tea, 32-48 ounces pepsI     Allergies   Lipitor [atorvastatin]   Review of Systems Review of Systems  Constitutional: Negative for chills and fever.  Respiratory: Negative for shortness of breath and wheezing.   Cardiovascular: Negative for chest  pain and palpitations.  Gastrointestinal: Positive for nausea. Negative for abdominal pain, constipation, diarrhea and vomiting.  Skin: Negative for rash.     Physical Exam Triage Vital Signs ED Triage Vitals  Enc Vitals Group     BP      Pulse      Resp      Temp      Temp src      SpO2      Weight      Height      Head Circumference      Peak Flow      Pain Score      Pain Loc      Pain Edu?      Excl. in [place]?    No data found.  Updated Vital Signs BP 138/78 (BP Location: Right Arm)   Pulse 90   Temp 98.3 F (36.8 C) (Oral)   Ht 5\' 5"  (1.651 m)   Wt 189 lb (85.7 kg)   SpO2 97%   BMI 31.45 kg/m   Visual Acuity Right Eye Distance:   Left Eye Distance:   Bilateral Distance:    Right Eye Near:   Left Eye Near:    Bilateral Near:     Physical Exam  Constitutional: She is oriented to person, place, and time. She appears well-developed and well-nourished. No distress.  HENT:  Head: Normocephalic and atraumatic.  Mouth/Throat: Oropharynx is clear and moist.  Eyes: EOM are normal.  Neck: Normal range of motion.  Cardiovascular: Normal rate and regular rhythm.  Pulmonary/Chest: Effort normal and breath sounds normal. No stridor. No respiratory distress. She has no wheezes. She has no rales.  Abdominal: Soft. She exhibits no distension. There is no tenderness. There is no CVA tenderness.  Musculoskeletal: Normal range of motion.  Neurological: She is alert and oriented to person, place, and time.  Skin: Skin is warm and dry. She is not diaphoretic.  Psychiatric: She has a normal mood and affect. Her behavior is normal.  Nursing note and vitals reviewed.    UC Treatments / Results  Labs (all labs ordered are listed, but only abnormal results are displayed) Labs Reviewed - No data to display  EKG None  Radiology No results found.  Procedures Procedures (including critical care time)  Medications Ordered in UC Medications - No data to  display  Initial Impression / Assessment and Plan / UC Course  I have reviewed the triage vital signs and the nursing notes.  Pertinent labs & imaging results that were available during my care of the patient were reviewed by me and considered in my medical decision making (see chart for details).  Nausea likely due to medication. Encouraged to continue to take the medication as prescribed. Zofran sent to pharmacy for pt to start taking. The prescribing provider will be back on Thursday of this week. Encouraged her to f/u then.  Final Clinical Impressions(s) / UC Diagnoses   Final diagnoses:  Nausea without vomiting   Discharge Instructions   None    ED Prescriptions    Medication Sig Dispense Auth. Provider   ondansetron (ZOFRAN) 4 MG tablet Take 1 tablet (4 mg total) by mouth every 6 (six) hours. 12 tablet Noe Gens, PA-C     Controlled Substance Prescriptions [place] Controlled Substance Registry consulted? Not Applicable   Tyrell Antonio 01/28/18 1840

## 2018-01-28 NOTE — Telephone Encounter (Signed)
Patient called back this morning and stated that she wants to try the Seroquel that Dr. Adele Schilder suggested. She also wanted something for her nausea. I informed patient that I would send in 50mg  of Seroquel to pharmacy but she would need to call her PCP for nausea medication. She stated her understanding. Next appointment with Dr. [name] is on 02/05/18.

## 2018-02-05 ENCOUNTER — Other Ambulatory Visit: Payer: Self-pay

## 2018-02-05 ENCOUNTER — Ambulatory Visit (INDEPENDENT_AMBULATORY_CARE_PROVIDER_SITE_OTHER): Payer: Medicare Other | Admitting: Psychiatry

## 2018-02-05 ENCOUNTER — Encounter (HOSPITAL_COMMUNITY): Payer: Self-pay | Admitting: Psychiatry

## 2018-02-05 VITALS — BP 130/72 | HR 78 | Ht 65.0 in | Wt 186.0 lb

## 2018-02-05 DIAGNOSIS — F315 Bipolar disorder, current episode depressed, severe, with psychotic features: Secondary | ICD-10-CM | POA: Diagnosis not present

## 2018-02-05 DIAGNOSIS — Z818 Family history of other mental and behavioral disorders: Secondary | ICD-10-CM | POA: Diagnosis not present

## 2018-02-05 DIAGNOSIS — G259 Extrapyramidal and movement disorder, unspecified: Secondary | ICD-10-CM

## 2018-02-05 DIAGNOSIS — F5102 Adjustment insomnia: Secondary | ICD-10-CM | POA: Diagnosis not present

## 2018-02-05 DIAGNOSIS — G8929 Other chronic pain: Secondary | ICD-10-CM | POA: Diagnosis not present

## 2018-02-05 MED ORDER — MIRTAZAPINE 7.5 MG PO TABS: 7.5000 mg | ORAL_TABLET | Freq: Every day | ORAL | 1 refills | Status: DC

## 2018-02-05 NOTE — Progress Notes (Signed)
Patient ID: Amber Rice, female   DOB: 01/09/1956, 62 y.o.   MRN: [id number] [place] [id number] 62 y.o.  02/05/2018  Chief Complaint: Follow up bipolar disorder   History of Present Illness:   Patient returns for Medication Follow up and is diagnosed with bipolar disorder depressed. Anxiety disorder NOS. Insomnia  Has been having nausea, risperdal was stopped. Providers were contacted. seroquel was started still felt nausea. Not taking  On lithium and zoloft  feels tired, mostly in bed. Nor paranoid. Not drinking aclohol  decrese energy and feeling subdued  Says haldol was helpful but since not getting it from pharmacy have not felt well   Shoulder pain; effects mood   Duration of bipolar : 12 years   Depression scale: 5.5/10  Modifying factors; walking when she can     Past Medical History:  Diagnosis Date  . Degenerative disc disease, lumbar    diagnosed in 2005  . Hyperlipidemia   . Impacted cerumen of left ear 04/23/2017   Family History  Problem Relation Age of Onset  . Cancer Mother   . Heart attack Father   . Hyperlipidemia Father   . Cancer Maternal Grandmother   . Cancer Paternal Grandmother   . Bipolar disorder Paternal Uncle        Committed suicide in his 59's  . Dementia Paternal Uncle   . Depression Cousin   . Schizophrenia Cousin   . Multiple sclerosis Sister   . Alcohol abuse Neg Hx   . Anxiety disorder Neg Hx     Outpatient Encounter Medications as of 02/05/2018  Medication Sig  . albuterol (PROVENTIL HFA;VENTOLIN HFA) 108 (90 Base) MCG/ACT inhaler Inhale 2 puffs into the lungs every 6 (six) hours as needed for wheezing or shortness of breath.  . diclofenac sodium (VOLTAREN) 1 % GEL Apply 4 g topically 4 (four) times daily. To affected joint.  Marland Kitchen lithium carbonate (LITHOBID) 300 MG CR tablet TAKE 1 TABLET (300 MG TOTAL) BY MOUTH 2 (TWO) TIMES DAILY.  Marland Kitchen sertraline (ZOLOFT) 100 MG tablet  TAKE 1 TABLET BY MOUTH TWICE A DAY  . mirtazapine (REMERON) 7.5 MG tablet Take 1 tablet (7.5 mg total) by mouth at bedtime.  . ondansetron (ZOFRAN) 4 MG tablet Take 1 tablet (4 mg total) by mouth every 6 (six) hours. (Patient not taking: Reported on 02/05/2018)  . [DISCONTINUED] QUEtiapine (SEROQUEL) 50 MG tablet Take 1 tablet (50 mg total) by mouth at bedtime. (Patient not taking: Reported on 02/05/2018)  . [DISCONTINUED] risperiDONE (RISPERDAL) 1 MG tablet TAKE 1/2 TABLET BY MOUTH AT BEDTIME FOR A FEW DAYS THEN INCREASE TO 1 TABLET DAILY (Patient not taking: Reported on 02/05/2018)   No facility-administered encounter medications on file as of 02/05/2018.     Recent Results (from the past 2160 hour(s))  HM COLONOSCOPY     Status: None   Collection Time: 01/02/18 12:00 AM  Result Value Ref Range   HM Colonoscopy See Report (in chart) See Report (in chart), Patient Reported    BP 130/72 (BP Location: Left Arm, Patient Position: Sitting, Cuff Size: Normal)   Pulse 78   Ht 5\' 5"  (1.651 m)   Wt 186 lb (84.4 kg)   BMI 30.95 kg/m    Review of Systems  Cardiovascular: Negative for chest pain.  Gastrointestinal: Positive for nausea.  Musculoskeletal: Positive for myalgias.  Neurological: Negative for headaches.  Psychiatric/Behavioral: Negative for substance abuse and suicidal ideas.    Mental Status  Examination  Appearance: casual Alert: Yes Attention: fair  Cooperative: Yes Eye Contact: Good Speech: normal Psychomotor Activity: Decreased Memory/Concentration: adequate Oriented: person, place, time/date and situation Mood: subdued Affect: congruent Thought Processes and Associations: Coherent no paranoia as of now [employer] of Knowledge: Fair Thought Content: Suicidal ideation and Homicidal ideation were denied Insight: Fair Judgement: Fair  Diagnosis: Bipolar disorder type I manic per history for psychotic features. Insomnia NOS. Adjustment disorder with anxiety . EPS Treatment Plan:    1. Bipolar: depression :felling subdued. But more so due to nausea. Will get lithium levels. Hold off from antipsychotics  Will add small dose of remeron to help with any GI side effects and if need can add zofran. Continue zoloft as it has helped depression    2. Depression:feels subdued. Add remeron. Continue other meds   Paranoia: denies. Not on haldol now but will explore if need to start back as it has helped before and depending on availability 3. Insomnia:irregular sleep cycle. adivsed not to take naps during the day Reviewed sleep hygiene, questions addressed. Also fu with primary care to explore medical reason if any for nausea Will send to labs for basic lab work and lithium levels Fu in 3 weeks or early if needed    [name], MD

## 2018-02-06 LAB — CBC
HEMATOCRIT: 45 % (ref 35.0–45.0)
HEMOGLOBIN: 15.6 g/dL — AB (ref 11.7–15.5)
MCH: 33.3 pg — AB (ref 27.0–33.0)
MCHC: 34.7 g/dL (ref 32.0–36.0)
MCV: 95.9 fL (ref 80.0–100.0)
MPV: 9.8 fL (ref 7.5–12.5)
Platelets: 473 10*3/uL — ABNORMAL HIGH (ref 140–400)
RBC: 4.69 10*6/uL (ref 3.80–5.10)
RDW: 12 % (ref 11.0–15.0)
WBC: 13.9 10*3/uL — ABNORMAL HIGH (ref 3.8–10.8)

## 2018-02-06 LAB — BASIC METABOLIC PANEL
BUN: 9 mg/dL (ref 7–25)
CALCIUM: 9.8 mg/dL (ref 8.6–10.4)
CO2: 24 mmol/L (ref 20–32)
CREATININE: 0.89 mg/dL (ref 0.50–0.99)
Chloride: 106 mmol/L (ref 98–110)
GLUCOSE: 92 mg/dL (ref 65–99)
POTASSIUM: 4.5 mmol/L (ref 3.5–5.3)
SODIUM: 138 mmol/L (ref 135–146)

## 2018-02-06 LAB — TSH: TSH: 4.17 mIU/L (ref 0.40–4.50)

## 2018-02-06 LAB — LITHIUM LEVEL: Lithium Lvl: 0.4 mmol/L — ABNORMAL LOW (ref 0.6–1.2)

## 2018-02-20 ENCOUNTER — Other Ambulatory Visit (HOSPITAL_COMMUNITY): Payer: Self-pay | Admitting: Psychiatry

## 2018-02-27 ENCOUNTER — Other Ambulatory Visit (HOSPITAL_COMMUNITY): Payer: Self-pay | Admitting: Psychiatry

## 2018-03-07 ENCOUNTER — Other Ambulatory Visit: Payer: Self-pay

## 2018-03-07 ENCOUNTER — Other Ambulatory Visit: Payer: Self-pay | Admitting: Physician Assistant

## 2018-03-07 ENCOUNTER — Ambulatory Visit (INDEPENDENT_AMBULATORY_CARE_PROVIDER_SITE_OTHER): Payer: Medicare Other | Admitting: Psychiatry

## 2018-03-07 ENCOUNTER — Encounter (HOSPITAL_COMMUNITY): Payer: Self-pay | Admitting: Psychiatry

## 2018-03-07 VITALS — BP 122/70 | HR 108 | Ht 65.0 in | Wt 193.0 lb

## 2018-03-07 DIAGNOSIS — G4700 Insomnia, unspecified: Secondary | ICD-10-CM

## 2018-03-07 DIAGNOSIS — G8929 Other chronic pain: Secondary | ICD-10-CM

## 2018-03-07 DIAGNOSIS — F315 Bipolar disorder, current episode depressed, severe, with psychotic features: Secondary | ICD-10-CM | POA: Diagnosis not present

## 2018-03-07 DIAGNOSIS — Z1239 Encounter for other screening for malignant neoplasm of breast: Secondary | ICD-10-CM

## 2018-03-07 DIAGNOSIS — G259 Extrapyramidal and movement disorder, unspecified: Secondary | ICD-10-CM

## 2018-03-07 MED ORDER — SERTRALINE HCL 100 MG PO TABS: 100.0000 mg | ORAL_TABLET | Freq: Two times a day (BID) | ORAL | 0 refills | Status: DC

## 2018-03-07 NOTE — Progress Notes (Signed)
Patient ID: Amber Rice, female   DOB: 03/03/1956, 62 y.o.   MRN: [id number] [place] [id number] 62 y.o.  03/07/2018  Chief Complaint: Follow up bipolar disorder   History of Present Illness:   Patient returns for Medication Follow up and is diagnosed with bipolar disorder depressed. Anxiety disorder NOS. Insomnia  Was having nausea on risperdal and seroquel in past. Started remeron last visit, it has helped. She is on lithium  levels are low but mood is fair. tsh and labs discussed and reviewed  Less tired  depression better on zoloft as well.   Says haldol was helpful but since not getting it from pharmacy have not felt well   Shoulder pain; effects mood   Duration of bipolar : 12 years   Depression scale:6/10 . 10 being no depression  Modifying factors; walking when she can     Past Medical History:  Diagnosis Date  . Degenerative disc disease, lumbar    diagnosed in 2005  . Hyperlipidemia   . Impacted cerumen of left ear 04/23/2017   Family History  Problem Relation Age of Onset  . Cancer Mother   . Heart attack Father   . Hyperlipidemia Father   . Cancer Maternal Grandmother   . Cancer Paternal Grandmother   . Bipolar disorder Paternal Uncle        Committed suicide in his 70's  . Dementia Paternal Uncle   . Depression Cousin   . Schizophrenia Cousin   . Multiple sclerosis Sister   . Alcohol abuse Neg Hx   . Anxiety disorder Neg Hx     Outpatient Encounter Medications as of 03/07/2018  Medication Sig  . albuterol (PROVENTIL HFA;VENTOLIN HFA) 108 (90 Base) MCG/ACT inhaler Inhale 2 puffs into the lungs every 6 (six) hours as needed for wheezing or shortness of breath.  . diclofenac sodium (VOLTAREN) 1 % GEL Apply 4 g topically 4 (four) times daily. To affected joint.  Marland Kitchen lithium carbonate (LITHOBID) 300 MG CR tablet TAKE 1 TABLET (300 MG TOTAL) BY MOUTH 2 (TWO) TIMES DAILY.  . mirtazapine (REMERON)  7.5 MG tablet TAKE 1 TABLET (7.5 MG TOTAL) BY MOUTH AT BEDTIME.  Marland Kitchen ondansetron (ZOFRAN) 4 MG tablet Take 1 tablet (4 mg total) by mouth every 6 (six) hours.  . sertraline (ZOLOFT) 100 MG tablet Take 1 tablet (100 mg total) by mouth 2 (two) times daily.  . [DISCONTINUED] sertraline (ZOLOFT) 100 MG tablet TAKE 1 TABLET BY MOUTH TWICE A DAY  . [DISCONTINUED] QUEtiapine (SEROQUEL) 50 MG tablet Take 1 tablet (50 mg total) by mouth at bedtime. (Patient not taking: Reported on 02/05/2018)  . [DISCONTINUED] risperiDONE (RISPERDAL) 1 MG tablet TAKE 1/2 TABLET BY MOUTH AT BEDTIME FOR A FEW DAYS THEN INCREASE TO 1 TABLET DAILY (Patient not taking: Reported on 02/05/2018)   No facility-administered encounter medications on file as of 03/07/2018.     Recent Results (from the past 2160 hour(s))  HM COLONOSCOPY     Status: None   Collection Time: 01/02/18 12:00 AM  Result Value Ref Range   HM Colonoscopy See Report (in chart) See Report (in chart), Patient Reported  Lithium level     Status: Abnormal   Collection Time: 02/05/18  4:11 PM  Result Value Ref Range   Lithium Lvl 0.4 (L) 0.6 - 1.2 mmol/L  TSH     Status: None   Collection Time: 02/05/18  4:11 PM  Result Value Ref Range   TSH  4.17 0.40 - 4.50 mIU/L  Basic metabolic panel     Status: None   Collection Time: 02/05/18  4:11 PM  Result Value Ref Range   Glucose, Bld 92 65 - 99 mg/dL    Comment: .            Fasting reference interval .    BUN 9 7 - 25 mg/dL   Creat 0.89 0.50 - 0.99 mg/dL    Comment: For patients >59 years of age, the reference limit for Creatinine is approximately 13% higher for people identified as African-American. .    BUN/Creatinine Ratio NOT APPLICABLE 6 - 22 (calc)   Sodium 138 135 - 146 mmol/L   Potassium 4.5 3.5 - 5.3 mmol/L   Chloride 106 98 - 110 mmol/L   CO2 24 20 - 32 mmol/L   Calcium 9.8 8.6 - 10.4 mg/dL  CBC     Status: Abnormal   Collection Time: 02/05/18  4:11 PM  Result Value Ref Range   WBC 13.9 (H)  3.8 - 10.8 Thousand/uL   RBC 4.69 3.80 - 5.10 Million/uL   Hemoglobin 15.6 (H) 11.7 - 15.5 g/dL   HCT 45.0 35.0 - 45.0 %   MCV 95.9 80.0 - 100.0 fL   MCH 33.3 (H) 27.0 - 33.0 pg   MCHC 34.7 32.0 - 36.0 g/dL   RDW 12.0 11.0 - 15.0 %   Platelets 473 (H) 140 - 400 Thousand/uL   MPV 9.8 7.5 - 12.5 fL    BP 122/70 (BP Location: Left Arm, Patient Position: Sitting, Cuff Size: Normal)   Pulse (!) 108   Ht 5\' 5"  (1.651 m)   Wt 193 lb (87.5 kg)   BMI 32.12 kg/m    Review of Systems  Cardiovascular: Negative for chest pain.  Musculoskeletal: Positive for myalgias.  Neurological: Negative for headaches.  Psychiatric/Behavioral: Negative for substance abuse and suicidal ideas.    Mental Status Examination  Appearance: casual Alert: Yes Attention: fair  Cooperative: Yes Eye Contact: Good Speech: normal Psychomotor Activity: Decreased Memory/Concentration: adequate Oriented: person, place, time/date and situation Mood: fair Affect: congruent Thought Processes and Associations: Coherent no paranoia as of now [employer] of Knowledge: Fair Thought Content: Suicidal ideation and Homicidal ideation were denied Insight: Fair Judgement: Fair  Diagnosis: Bipolar disorder type I manic per history for psychotic features. Insomnia NOS. Adjustment disorder with anxiety . EPS Treatment Plan:   1. Bipolar: depression :better depression. Continue lithium, if needed can increase as levels are low normal   2. Depression:better. Continue remeron. Not increase . Be cautious of weigh tgain Not paranoid,  3. Insomnia:irregular sleep hygiene reviewed. meds and labs reviewed.  Fu 50m.    [name], MD

## 2018-04-03 ENCOUNTER — Ambulatory Visit (INDEPENDENT_AMBULATORY_CARE_PROVIDER_SITE_OTHER): Payer: Medicare Other

## 2018-04-03 DIAGNOSIS — Z1239 Encounter for other screening for malignant neoplasm of breast: Secondary | ICD-10-CM

## 2018-04-03 DIAGNOSIS — Z1231 Encounter for screening mammogram for malignant neoplasm of breast: Secondary | ICD-10-CM | POA: Diagnosis not present

## 2018-04-03 NOTE — Progress Notes (Signed)
Call pt: normal mammogram. Follow up in one year.

## 2018-04-09 ENCOUNTER — Other Ambulatory Visit (HOSPITAL_COMMUNITY): Payer: Self-pay | Admitting: Psychiatry

## 2018-05-07 ENCOUNTER — Telehealth: Payer: Self-pay

## 2018-05-07 NOTE — Telephone Encounter (Signed)
Amber Rice called today and said she has applied for a new apartment and she is  requesting to get one of the apartments on the first floor of the facility. She said that in order for her to be approved for an apartment on the first floor she must have a doctor's note stating why. She said she has a hard time climbing stairs, and just wanted to see if you would write her a note. Thanks!

## 2018-05-07 NOTE — Telephone Encounter (Signed)
Ok to write letter for apartment. For chronic SI joint pain and difficulty climbing stairs she should have a bottom level apartment. I will sign when finished.

## 2018-05-09 ENCOUNTER — Telehealth: Payer: Self-pay

## 2018-05-13 ENCOUNTER — Other Ambulatory Visit: Payer: Self-pay | Admitting: Physician Assistant

## 2018-05-28 ENCOUNTER — Other Ambulatory Visit (HOSPITAL_COMMUNITY): Payer: Self-pay | Admitting: Psychiatry

## 2018-06-01 ENCOUNTER — Other Ambulatory Visit (HOSPITAL_COMMUNITY): Payer: Self-pay | Admitting: Psychiatry

## 2018-07-08 ENCOUNTER — Ambulatory Visit (HOSPITAL_COMMUNITY): Payer: Self-pay | Admitting: Psychiatry

## 2018-07-08 ENCOUNTER — Encounter (HOSPITAL_COMMUNITY): Payer: Self-pay | Admitting: Psychiatry

## 2018-07-08 ENCOUNTER — Ambulatory Visit (HOSPITAL_COMMUNITY): Payer: Medicare Other | Admitting: Psychiatry

## 2018-07-08 ENCOUNTER — Other Ambulatory Visit: Payer: Self-pay

## 2018-07-08 ENCOUNTER — Ambulatory Visit (INDEPENDENT_AMBULATORY_CARE_PROVIDER_SITE_OTHER): Payer: Medicare Other | Admitting: Psychiatry

## 2018-07-08 VITALS — BP 148/88 | HR 110 | Ht 65.0 in | Wt 202.0 lb

## 2018-07-08 DIAGNOSIS — G259 Extrapyramidal and movement disorder, unspecified: Secondary | ICD-10-CM

## 2018-07-08 DIAGNOSIS — F5102 Adjustment insomnia: Secondary | ICD-10-CM

## 2018-07-08 DIAGNOSIS — F315 Bipolar disorder, current episode depressed, severe, with psychotic features: Secondary | ICD-10-CM

## 2018-07-08 DIAGNOSIS — G8929 Other chronic pain: Secondary | ICD-10-CM | POA: Diagnosis not present

## 2018-07-08 MED ORDER — LITHIUM CARBONATE ER 300 MG PO TBCR: 300.0000 mg | EXTENDED_RELEASE_TABLET | Freq: Two times a day (BID) | ORAL | 0 refills | Status: DC

## 2018-07-08 MED ORDER — SERTRALINE HCL 100 MG PO TABS: 100.0000 mg | ORAL_TABLET | Freq: Two times a day (BID) | ORAL | 0 refills | Status: DC

## 2018-07-08 NOTE — Progress Notes (Signed)
Patient ID: Amber Rice, female   DOB: 1956/02/11, 62 y.o.   MRN: [id number] [place] [id number] 62 y.o.  07/08/2018  Chief Complaint: Follow up bipolar disorder   History of Present Illness:   Patient returns for Medication Follow up and is diagnosed with bipolar disorder depressed. Anxiety disorder NOS. Insomnia  Was having nausea on risperdal and seroquel in past.  Doing fair. Moved to [facility] but still wants to come this clinic  no paranoia  less tired   depression better on zoloft as well.  Lithium level 0.4 when last done Duration of bipolar : 12 years   Depression scale:6/10 . 10 being no depression  Modifying factors; walking when she can     Past Medical History:  Diagnosis Date  . Degenerative disc disease, lumbar    diagnosed in 2005  . Hyperlipidemia   . Impacted cerumen of left ear 04/23/2017   Family History  Problem Relation Age of Onset  . Cancer Mother   . Heart attack Father   . Hyperlipidemia Father   . Cancer Maternal Grandmother   . Cancer Paternal Grandmother   . Bipolar disorder Paternal Uncle        Committed suicide in his 64's  . Dementia Paternal Uncle   . Depression Cousin   . Schizophrenia Cousin   . Multiple sclerosis Sister   . Alcohol abuse Neg Hx   . Anxiety disorder Neg Hx     Outpatient Encounter Medications as of 07/08/2018  Medication Sig  . albuterol (PROVENTIL HFA;VENTOLIN HFA) 108 (90 Base) MCG/ACT inhaler Inhale 2 puffs into the lungs every 6 (six) hours as needed for wheezing or shortness of breath.  . lithium carbonate (LITHOBID) 300 MG CR tablet Take 1 tablet (300 mg total) by mouth 2 (two) times daily.  . sertraline (ZOLOFT) 100 MG tablet Take 1 tablet (100 mg total) by mouth 2 (two) times daily.  . [DISCONTINUED] lithium carbonate (LITHOBID) 300 MG CR tablet TAKE 1 TABLET (300 MG TOTAL) BY MOUTH 2 (TWO) TIMES DAILY.  . [DISCONTINUED] sertraline (ZOLOFT) 100  MG tablet TAKE 1 TABLET BY MOUTH TWICE A DAY  . diclofenac sodium (VOLTAREN) 1 % GEL Apply 4 g topically 4 (four) times daily. To affected joint. (Patient not taking: Reported on 07/08/2018)  . ondansetron (ZOFRAN) 4 MG tablet Take 1 tablet (4 mg total) by mouth every 6 (six) hours. (Patient not taking: Reported on 07/08/2018)  . [DISCONTINUED] mirtazapine (REMERON) 7.5 MG tablet TAKE 1 TABLET (7.5 MG TOTAL) BY MOUTH AT BEDTIME. (Patient not taking: Reported on 07/08/2018)  . [DISCONTINUED] QUEtiapine (SEROQUEL) 50 MG tablet Take 1 tablet (50 mg total) by mouth at bedtime. (Patient not taking: Reported on 02/05/2018)  . [DISCONTINUED] risperiDONE (RISPERDAL) 1 MG tablet TAKE 1/2 TABLET BY MOUTH AT BEDTIME FOR A FEW DAYS THEN INCREASE TO 1 TABLET DAILY (Patient not taking: Reported on 07/08/2018)   No facility-administered encounter medications on file as of 07/08/2018.     No results found for this or any previous visit (from the past 2160 hour(s)).  BP (!) 148/88 (BP Location: Left Arm, Patient Position: Sitting, Cuff Size: Normal)   Pulse (!) 110   Ht 5\' 5"  (1.651 m)   Wt 202 lb (91.6 kg)   BMI 33.61 kg/m    Review of Systems  Cardiovascular: Negative for palpitations.  Neurological: Negative for tremors and headaches.  Psychiatric/Behavioral: Negative for substance abuse and suicidal ideas.    Mental  Status Examination  Appearance: casual Alert: Yes Attention: fair  Cooperative: Yes Eye Contact: Good Speech: normal Psychomotor Activity: Decreased Memory/Concentration: adequate Oriented: person, place, time/date and situation Mood:fair Affect: congruent Thought Processes and Associations: Coherent no paranoia as of now [employer] of Knowledge: Fair Thought Content: Suicidal ideation and Homicidal ideation were denied Insight: Fair Judgement: Fair  Diagnosis: Bipolar disorder type I manic per history for psychotic features. Insomnia NOS. Adjustment disorder with anxiety .  EPS Treatment Plan:   1. Bipolar: depression doing fair. Continue lithium, reviewed side effects  2. Depression:better.continue zoloft. Not paranoid 3. Insomnia:irregular sleep hygiene reviewed. meds and labs reviewed.  Fu 3-20m.    [name], MD

## 2018-07-09 ENCOUNTER — Other Ambulatory Visit (HOSPITAL_COMMUNITY): Payer: Self-pay | Admitting: Psychiatry

## 2018-08-09 NOTE — Telephone Encounter (Signed)
Called and left a voicemail for patient to return call regarding letter needed for apartment.

## 2018-09-02 DIAGNOSIS — K145 Plicated tongue: Secondary | ICD-10-CM | POA: Diagnosis not present

## 2018-09-02 DIAGNOSIS — M79671 Pain in right foot: Secondary | ICD-10-CM | POA: Diagnosis not present

## 2018-09-04 ENCOUNTER — Ambulatory Visit (INDEPENDENT_AMBULATORY_CARE_PROVIDER_SITE_OTHER): Payer: Medicare Other

## 2018-09-04 ENCOUNTER — Other Ambulatory Visit: Payer: Self-pay | Admitting: Physician Assistant

## 2018-09-04 DIAGNOSIS — M19071 Primary osteoarthritis, right ankle and foot: Secondary | ICD-10-CM | POA: Diagnosis not present

## 2018-09-04 DIAGNOSIS — G8929 Other chronic pain: Secondary | ICD-10-CM

## 2018-09-24 DIAGNOSIS — M76821 Posterior tibial tendinitis, right leg: Secondary | ICD-10-CM | POA: Diagnosis not present

## 2018-09-24 DIAGNOSIS — M79671 Pain in right foot: Secondary | ICD-10-CM | POA: Diagnosis not present

## 2018-09-30 ENCOUNTER — Other Ambulatory Visit (HOSPITAL_COMMUNITY): Payer: Self-pay | Admitting: Psychiatry

## 2018-11-11 ENCOUNTER — Ambulatory Visit (HOSPITAL_COMMUNITY): Payer: Medicare Other | Admitting: Psychiatry

## 2018-11-13 ENCOUNTER — Ambulatory Visit (HOSPITAL_COMMUNITY): Payer: Medicare Other | Admitting: Psychiatry

## 2018-12-13 DIAGNOSIS — G8929 Other chronic pain: Secondary | ICD-10-CM | POA: Diagnosis not present

## 2018-12-13 DIAGNOSIS — M5441 Lumbago with sciatica, right side: Secondary | ICD-10-CM | POA: Diagnosis not present

## 2018-12-13 DIAGNOSIS — M25512 Pain in left shoulder: Secondary | ICD-10-CM | POA: Diagnosis not present

## 2018-12-13 DIAGNOSIS — M79671 Pain in right foot: Secondary | ICD-10-CM | POA: Diagnosis not present

## 2018-12-13 DIAGNOSIS — M5442 Lumbago with sciatica, left side: Secondary | ICD-10-CM | POA: Diagnosis not present

## 2018-12-18 ENCOUNTER — Other Ambulatory Visit: Payer: Self-pay

## 2018-12-19 ENCOUNTER — Encounter: Payer: Self-pay | Admitting: Family Medicine

## 2018-12-19 ENCOUNTER — Ambulatory Visit (INDEPENDENT_AMBULATORY_CARE_PROVIDER_SITE_OTHER): Payer: Medicare Other | Admitting: Family Medicine

## 2018-12-19 VITALS — BP 136/84 | HR 92 | Temp 98.3°F | Ht 65.0 in | Wt 212.0 lb

## 2018-12-19 DIAGNOSIS — Z6835 Body mass index (BMI) 35.0-35.9, adult: Secondary | ICD-10-CM | POA: Diagnosis not present

## 2018-12-19 DIAGNOSIS — Z5181 Encounter for therapeutic drug level monitoring: Secondary | ICD-10-CM | POA: Diagnosis not present

## 2018-12-19 DIAGNOSIS — K219 Gastro-esophageal reflux disease without esophagitis: Secondary | ICD-10-CM

## 2018-12-19 DIAGNOSIS — E038 Other specified hypothyroidism: Secondary | ICD-10-CM

## 2018-12-19 DIAGNOSIS — E782 Mixed hyperlipidemia: Secondary | ICD-10-CM

## 2018-12-19 DIAGNOSIS — Z7689 Persons encountering health services in other specified circumstances: Secondary | ICD-10-CM

## 2018-12-19 DIAGNOSIS — D72828 Other elevated white blood cell count: Secondary | ICD-10-CM

## 2018-12-19 NOTE — Progress Notes (Signed)
Subjective:  Patient ID: Amber Rice, female    DOB: [birth year], 63 y.o.   MRN: [id number]  Chief Complaint:  Establish Care   HPI: Amber Rice is a 63 y.o. female presenting on 12/19/2018 for Establish Care   1. Encounter to establish care  Pt presents today to establish care with a new PCP. Pt recently moved here from [place] and would like a PCP closer to home. Pt states she is fairly healthy overall. She is a 1 PPD smoker for 48 years. States she has tried to quit in the past, states this is unsuccessful. She is not interested in smoking cessation at this time. She states she has a hysterectomy in 2001 due to endometriosis. Last colonoscopy 1 year ago, due again in 2 years due to colon polyps.   2. Gastroesophageal reflux disease without esophagitis  Well controlled with diet and avoiding triggers. Not on medications and does not require OTC treatment.    3. Other specified hypothyroidism  Previously on levothyroxine. States she has not needed this in years. States she feels fine without the medication. No fatigue, weight changes, hair loss, skin or nail changes, or brain fog.    4. Mixed hyperlipidemia  Diet controlled. Does not adhere to diet. Does not exercise on a regular basis.      Relevant past medical, surgical, family, and social history reviewed and updated as indicated.  Allergies and medications reviewed and updated.   Past Medical History:  Diagnosis Date  . Degenerative disc disease, lumbar    diagnosed in 2005  . Hyperlipidemia   . Impacted cerumen of left ear 04/23/2017    Past Surgical History:  Procedure Laterality Date  . ABDOMINAL HYSTERECTOMY    . APPENDECTOMY    . LAPAROTOMY      Social History   Socioeconomic History  . Marital status: Widowed    Spouse name: Not on file  . Number of children: Not on file  . Years of education: Not on file  . Highest education level: Not on file  Occupational History  . Not on file  Social  Needs  . Financial resource strain: Not on file  . Food insecurity:    Worry: Not on file    Inability: Not on file  . Transportation needs:    Medical: Not on file    Non-medical: Not on file  Tobacco Use  . Smoking status: Current Every Day Smoker    Packs/day: 0.50    Years: 44.00    Pack years: 22.00    Types: Cigarettes    Last attempt to quit: 10/30/2016    Years since quitting: 2.1  . Smokeless tobacco: Never Used  Substance and Sexual Activity  . Alcohol use: No  . Drug use: No    Comment: Caffeine: Coffee 1-2 cups  per day. 24 Ounce tea, 32-48 ounces pepsI  . Sexual activity: Yes  Lifestyle  . Physical activity:    Days per week: Not on file    Minutes per session: Not on file  . Stress: Not on file  Relationships  . Social connections:    Talks on phone: Not on file    Gets together: Not on file    Attends religious service: Not on file    Active member of club or organization: Not on file    Attends meetings of clubs or organizations: Not on file    Relationship status: Not on file  . Intimate partner violence:  Fear of current or ex partner: Not on file    Emotionally abused: Not on file    Physically abused: Not on file    Forced sexual activity: Not on file  Other Topics Concern  . Not on file  Social History Narrative  . Not on file    Outpatient Encounter Medications as of 12/19/2018  Medication Sig  . lithium carbonate (LITHOBID) 300 MG CR tablet TAKE 1 TABLET (300 MG TOTAL) BY MOUTH 2 (TWO) TIMES DAILY.  Marland Kitchen sertraline (ZOLOFT) 100 MG tablet Take 1 tablet (100 mg total) by mouth 2 (two) times daily.  . [DISCONTINUED] albuterol (PROVENTIL HFA;VENTOLIN HFA) 108 (90 Base) MCG/ACT inhaler Inhale 2 puffs into the lungs every 6 (six) hours as needed for wheezing or shortness of breath.  . [DISCONTINUED] diclofenac sodium (VOLTAREN) 1 % GEL Apply 4 g topically 4 (four) times daily. To affected joint. (Patient not taking: Reported on 07/08/2018)  .  [DISCONTINUED] mirtazapine (REMERON) 7.5 MG tablet TAKE 1 TABLET (7.5 MG TOTAL) BY MOUTH AT BEDTIME. (Patient not taking: Reported on 07/08/2018)  . [DISCONTINUED] ondansetron (ZOFRAN) 4 MG tablet Take 1 tablet (4 mg total) by mouth every 6 (six) hours. (Patient not taking: Reported on 07/08/2018)  . [DISCONTINUED] QUEtiapine (SEROQUEL) 50 MG tablet Take 1 tablet (50 mg total) by mouth at bedtime. (Patient not taking: Reported on 02/05/2018)  . [DISCONTINUED] risperiDONE (RISPERDAL) 1 MG tablet TAKE 1/2 TABLET BY MOUTH AT BEDTIME FOR A FEW DAYS THEN INCREASE TO 1 TABLET DAILY (Patient not taking: Reported on 07/08/2018)   No facility-administered encounter medications on file as of 12/19/2018.     Allergies  Allergen Reactions  . Lipitor [Atorvastatin] Swelling    Review of Systems  Constitutional: Negative for activity change, appetite change, chills, diaphoresis, fatigue, fever and unexpected weight change.  HENT: Negative for sore throat, trouble swallowing and voice change.   Eyes: Negative for photophobia and visual disturbance.  Respiratory: Negative for cough, choking, chest tightness and shortness of breath.   Cardiovascular: Negative for chest pain, palpitations and leg swelling.  Gastrointestinal: Negative for abdominal distention, abdominal pain, anal bleeding, blood in stool, constipation, diarrhea, nausea, rectal pain and vomiting.  Endocrine: Negative for cold intolerance, heat intolerance, polydipsia, polyphagia and polyuria.  Genitourinary: Negative for decreased urine volume and difficulty urinating.  Musculoskeletal: Positive for arthralgias (chronic) and back pain (chronic). Negative for myalgias.  Neurological: Negative for dizziness, tremors, seizures, syncope, facial asymmetry, speech difficulty, weakness, light-headedness, numbness and headaches.  Hematological: Does not bruise/bleed easily.  Psychiatric/Behavioral: Negative for confusion and suicidal ideas. The patient is  not nervous/anxious.   All other systems reviewed and are negative.       Objective:  BP 136/84   Pulse 92   Temp 98.3 F (36.8 C) (Oral)   Ht 5' 5" (1.651 m)   Wt 212 lb (96.2 kg)   BMI 35.28 kg/m    Wt Readings from Last 3 Encounters:  12/19/18 212 lb (96.2 kg)  01/28/18 189 lb (85.7 kg)  11/07/17 190 lb (86.2 kg)    Physical Exam Vitals signs and nursing note reviewed.  Constitutional:      General: She is not in acute distress.    Appearance: Normal appearance. She is well-developed and well-groomed. She is obese. She is ill-appearing. She is not toxic-appearing.  HENT:     Head: Normocephalic and atraumatic.     Nose: Nose normal.     Mouth/Throat:     Mouth: Mucous membranes are  moist.     Pharynx: Oropharynx is clear.  Eyes:     Conjunctiva/sclera: Conjunctivae normal.     Pupils: Pupils are equal, round, and reactive to light.  Neck:     Musculoskeletal: Normal range of motion and neck supple.  Cardiovascular:     Rate and Rhythm: Normal rate and regular rhythm.     Pulses: Normal pulses.     Heart sounds: Normal heart sounds. No murmur. No friction rub. No gallop.   Pulmonary:     Effort: Pulmonary effort is normal. No respiratory distress.     Breath sounds: Normal breath sounds.  Abdominal:     General: Bowel sounds are normal.     Palpations: Abdomen is soft.     Tenderness: There is no abdominal tenderness.  Skin:    General: Skin is warm and dry.     Capillary Refill: Capillary refill takes less than 2 seconds.  Neurological:     General: No focal deficit present.     Mental Status: She is alert and oriented to person, place, and time.     Cranial Nerves: No cranial nerve deficit.     Sensory: No sensory deficit.     Motor: No weakness.     Coordination: Coordination normal.     Gait: Gait normal.     Deep Tendon Reflexes: Reflexes normal.  Psychiatric:        Mood and Affect: Mood normal.        Behavior: Behavior normal. Behavior is  cooperative.        Thought Content: Thought content normal.        Judgment: Judgment normal.     Results for orders placed or performed in visit on 01/14/18  HM COLONOSCOPY  Result Value Ref Range   HM Colonoscopy See Report (in chart) See Report (in chart), Patient Reported       Pertinent labs & imaging results that were available during my care of the patient were reviewed by me and considered in my medical decision making.  Assessment & Plan:  Daylani was seen today for establish care.  Diagnoses and all orders for this visit:  Encounter to establish care -     CMP14+EGFR -     Lithium level  Gastroesophageal reflux disease without esophagitis Controlled by diet.  -     CBC with Differential/Platelet  Other specified hypothyroidism Previously elevated TSH. Per pt this has resolved and she is not on repletion therapy. Labs pending.  -     Lipid panel -     Thyroid Panel With TSH  Mixed hyperlipidemia Diet and exercise encouraged. Not on statin therapy. Labs pending, will initiate therapy if warranted.  -     CMP14+EGFR -     Lipid panel  Other elevated white blood cell (WBC) count Previous leukocytosis, will recheck CBC today.  -     CBC with Differential/Platelet  Encounter for therapeutic drug level monitoring Lithium therapy, will check levels today. Pt has appointment with psychiatry tomorrow.  -     Lithium level     Continue all other maintenance medications.  Follow up plan: Return in about 3 months (around 03/21/2019), or if symptoms worsen or fail to improve, for CPE.   The above assessment and management plan was discussed with the patient. The patient verbalized understanding of and has agreed to the management plan. Patient is aware to call the clinic if symptoms persist or worsen. Patient is aware when to return   to the clinic for a follow-up visit. Patient educated on when it is appropriate to go to the emergency department.   Michelle ,  FNP-C [facility] [phone]  

## 2018-12-20 ENCOUNTER — Encounter (HOSPITAL_COMMUNITY): Payer: Self-pay | Admitting: Psychiatry

## 2018-12-20 ENCOUNTER — Ambulatory Visit (INDEPENDENT_AMBULATORY_CARE_PROVIDER_SITE_OTHER): Payer: Medicare Other | Admitting: Psychiatry

## 2018-12-20 DIAGNOSIS — G8929 Other chronic pain: Secondary | ICD-10-CM

## 2018-12-20 DIAGNOSIS — F315 Bipolar disorder, current episode depressed, severe, with psychotic features: Secondary | ICD-10-CM

## 2018-12-20 DIAGNOSIS — G259 Extrapyramidal and movement disorder, unspecified: Secondary | ICD-10-CM

## 2018-12-20 DIAGNOSIS — F5102 Adjustment insomnia: Secondary | ICD-10-CM | POA: Diagnosis not present

## 2018-12-20 LAB — CBC WITH DIFFERENTIAL/PLATELET
Basophils Absolute: 0.1 10*3/uL (ref 0.0–0.2)
Basos: 1 %
EOS (ABSOLUTE): 0.5 10*3/uL — ABNORMAL HIGH (ref 0.0–0.4)
Eos: 3 %
Hematocrit: 44.6 % (ref 34.0–46.6)
Hemoglobin: 15.3 g/dL (ref 11.1–15.9)
Immature Grans (Abs): 0.2 10*3/uL — ABNORMAL HIGH (ref 0.0–0.1)
Immature Granulocytes: 1 %
Lymphocytes Absolute: 3.9 10*3/uL — ABNORMAL HIGH (ref 0.7–3.1)
Lymphs: 24 %
MCH: 33.1 pg — ABNORMAL HIGH (ref 26.6–33.0)
MCHC: 34.3 g/dL (ref 31.5–35.7)
MCV: 97 fL (ref 79–97)
Monocytes Absolute: 0.9 10*3/uL (ref 0.1–0.9)
Monocytes: 6 %
Neutrophils Absolute: 10.4 10*3/uL — ABNORMAL HIGH (ref 1.4–7.0)
Neutrophils: 65 %
Platelets: 483 10*3/uL — ABNORMAL HIGH (ref 150–450)
RBC: 4.62 x10E6/uL (ref 3.77–5.28)
RDW: 12.1 % (ref 11.7–15.4)
WBC: 16.1 10*3/uL — ABNORMAL HIGH (ref 3.4–10.8)

## 2018-12-20 LAB — CMP14+EGFR
ALT: 18 IU/L (ref 0–32)
AST: 20 IU/L (ref 0–40)
Albumin/Globulin Ratio: 1.7 (ref 1.2–2.2)
Albumin: 4.1 g/dL (ref 3.8–4.8)
Alkaline Phosphatase: 98 IU/L (ref 39–117)
BUN/Creatinine Ratio: 22 (ref 12–28)
BUN: 18 mg/dL (ref 8–27)
Bilirubin Total: 0.3 mg/dL (ref 0.0–1.2)
CO2: 17 mmol/L — ABNORMAL LOW (ref 20–29)
Calcium: 10 mg/dL (ref 8.7–10.3)
Chloride: 102 mmol/L (ref 96–106)
Creatinine, Ser: 0.81 mg/dL (ref 0.57–1.00)
GFR calc Af Amer: 90 mL/min/{1.73_m2} (ref >59)
GFR calc non Af Amer: 78 mL/min/{1.73_m2} (ref >59)
Globulin, Total: 2.4 g/dL (ref 1.5–4.5)
Glucose: 77 mg/dL (ref 65–99)
Potassium: 5 mmol/L (ref 3.5–5.2)
Sodium: 137 mmol/L (ref 134–144)
Total Protein: 6.5 g/dL (ref 6.0–8.5)

## 2018-12-20 LAB — LIPID PANEL
Chol/HDL Ratio: 7.2 ratio — ABNORMAL HIGH (ref 0.0–4.4)
Cholesterol, Total: 368 mg/dL — ABNORMAL HIGH (ref 100–199)
HDL: 51 mg/dL (ref >39)
LDL Calculated: 238 mg/dL — ABNORMAL HIGH (ref 0–99)
Triglycerides: 397 mg/dL — ABNORMAL HIGH (ref 0–149)
VLDL Cholesterol Cal: 79 mg/dL — ABNORMAL HIGH (ref 5–40)

## 2018-12-20 LAB — THYROID PANEL WITH TSH
Free Thyroxine Index: 1.5 (ref 1.2–4.9)
T3 Uptake Ratio: 25 % (ref 24–39)
T4, Total: 5.8 ug/dL (ref 4.5–12.0)
TSH: 4.96 u[IU]/mL — ABNORMAL HIGH (ref 0.450–4.500)

## 2018-12-20 LAB — LITHIUM LEVEL: Lithium Lvl: 0.5 mmol/L — ABNORMAL LOW (ref 0.6–1.2)

## 2018-12-20 MED ORDER — SERTRALINE HCL 100 MG PO TABS: 100.0000 mg | ORAL_TABLET | Freq: Two times a day (BID) | ORAL | 0 refills | Status: DC

## 2018-12-20 MED ORDER — ROSUVASTATIN CALCIUM 20 MG PO TABS: 20.0000 mg | ORAL_TABLET | Freq: Every day | ORAL | 3 refills | Status: DC

## 2018-12-20 MED ORDER — LITHIUM CARBONATE ER 300 MG PO TBCR: 300.0000 mg | EXTENDED_RELEASE_TABLET | Freq: Two times a day (BID) | ORAL | 0 refills | Status: DC

## 2018-12-20 NOTE — Addendum Note (Signed)
Addended by: Baruch Gouty on: 12/20/2018 01:23 PM   Modules accepted: Orders

## 2018-12-20 NOTE — Progress Notes (Signed)
Patient ID: Amber Rice, female   DOB: [birth year], 63 y.o.   MRN: [id number] [place] [id number] 62 y.o.  12/20/2018  Chief Complaint: Follow up bipolar disorder. Med review  I connected with Amber Rice on 12/20/18 at 10:30 AM EDT by telephone and verified that I am speaking with the correct person using two identifiers.   I discussed the limitations, risks, security and privacy concerns of performing an evaluation and management service by telephone and the availability of in person appointments. I also discussed with the patient that there may be a patient responsible charge related to this service. The patient expressed understanding and agreed to proceed.  History of Present Illness:   Diagnosed with bipolar disorder depressed. Anxiety disorder NOS. Insomnia  Was having nausea on risperdal and seroquel in past.  Doing fair on lithium, have moved to [facility] but still follows with [place] Labs done yesterday by pcp, lithium is normal, slight elevation of TSH, asked to discuss with primary care as she does not want to stop or cut down lithium.    no paranoia  less tired   depression better on zoloft as well.   Duration of bipolar : 12 years   Depression scale 7/10 . 10 being no depression  Modifying factors; walking when she can . Reading books    Past Medical History:  Diagnosis Date  . Degenerative disc disease, lumbar    diagnosed in 2005  . Hyperlipidemia   . Impacted cerumen of left ear 04/23/2017   Family History  Problem Relation Age of Onset  . Cancer Mother   . Heart attack Father   . Hyperlipidemia Father   . Cancer Maternal Grandmother   . Cancer Paternal Grandmother   . Bipolar disorder Paternal Uncle        Committed suicide in his 99's  . Dementia Paternal Uncle   . Depression Cousin   . Schizophrenia Cousin   . Multiple sclerosis Sister   . Alcohol abuse Neg Hx   . Anxiety disorder Neg Hx      Outpatient Encounter Medications as of 12/20/2018  Medication Sig  . lithium carbonate (LITHOBID) 300 MG CR tablet Take 1 tablet (300 mg total) by mouth 2 (two) times daily.  . sertraline (ZOLOFT) 100 MG tablet Take 1 tablet (100 mg total) by mouth 2 (two) times daily.  . [DISCONTINUED] lithium carbonate (LITHOBID) 300 MG CR tablet TAKE 1 TABLET (300 MG TOTAL) BY MOUTH 2 (TWO) TIMES DAILY.  . [DISCONTINUED] mirtazapine (REMERON) 7.5 MG tablet TAKE 1 TABLET (7.5 MG TOTAL) BY MOUTH AT BEDTIME. (Patient not taking: Reported on 07/08/2018)  . [DISCONTINUED] QUEtiapine (SEROQUEL) 50 MG tablet Take 1 tablet (50 mg total) by mouth at bedtime. (Patient not taking: Reported on 02/05/2018)  . [DISCONTINUED] risperiDONE (RISPERDAL) 1 MG tablet TAKE 1/2 TABLET BY MOUTH AT BEDTIME FOR A FEW DAYS THEN INCREASE TO 1 TABLET DAILY (Patient not taking: Reported on 07/08/2018)  . [DISCONTINUED] sertraline (ZOLOFT) 100 MG tablet Take 1 tablet (100 mg total) by mouth 2 (two) times daily.   No facility-administered encounter medications on file as of 12/20/2018.     Recent Results (from the past 2160 hour(s))  CMP14+EGFR     Status: Abnormal   Collection Time: 12/19/18  2:24 PM  Result Value Ref Range   Glucose 77 65 - 99 mg/dL   BUN 18 8 - 27 mg/dL   Creatinine, Ser 0.81 0.57 - 1.00 mg/dL  GFR calc non Af Amer 78 >59 mL/min/1.73   GFR calc Af Amer 90 >59 mL/min/1.73   BUN/Creatinine Ratio 22 12 - 28   Sodium 137 134 - 144 mmol/L   Potassium 5.0 3.5 - 5.2 mmol/L   Chloride 102 96 - 106 mmol/L   CO2 17 (L) 20 - 29 mmol/L   Calcium 10.0 8.7 - 10.3 mg/dL   Total Protein 6.5 6.0 - 8.5 g/dL   Albumin 4.1 3.8 - 4.8 g/dL   Globulin, Total 2.4 1.5 - 4.5 g/dL   Albumin/Globulin Ratio 1.7 1.2 - 2.2   Bilirubin Total 0.3 0.0 - 1.2 mg/dL   Alkaline Phosphatase 98 39 - 117 IU/L   AST 20 0 - 40 IU/L   ALT 18 0 - 32 IU/L  CBC with Differential/Platelet     Status: Abnormal   Collection Time: 12/19/18  2:24 PM   Result Value Ref Range   WBC 16.1 (H) 3.4 - 10.8 x10E3/uL   RBC 4.62 3.77 - 5.28 x10E6/uL   Hemoglobin 15.3 11.1 - 15.9 g/dL   Hematocrit 44.6 34.0 - 46.6 %   MCV 97 79 - 97 fL   MCH 33.1 (H) 26.6 - 33.0 pg   MCHC 34.3 31.5 - 35.7 g/dL   RDW 12.1 11.7 - 15.4 %   Platelets 483 (H) 150 - 450 x10E3/uL   Neutrophils 65 Not Estab. %   Lymphs 24 Not Estab. %   Monocytes 6 Not Estab. %   Eos 3 Not Estab. %   Basos 1 Not Estab. %   Neutrophils Absolute 10.4 (H) 1.4 - 7.0 x10E3/uL   Lymphocytes Absolute 3.9 (H) 0.7 - 3.1 x10E3/uL   Monocytes Absolute 0.9 0.1 - 0.9 x10E3/uL   EOS (ABSOLUTE) 0.5 (H) 0.0 - 0.4 x10E3/uL   Basophils Absolute 0.1 0.0 - 0.2 x10E3/uL   Immature Granulocytes 1 Not Estab. %   Immature Grans (Abs) 0.2 (H) 0.0 - 0.1 x10E3/uL    Comment: (An elevated percentage of Immature Granulocytes has not been found to be clinically significant as a sole clinical predictor of disease. Does NOT include bands or blast cells.  Pregnancy associated physiological leukocytosis may also show increased immature granulocytes without clinical significance.)   Lipid panel     Status: Abnormal   Collection Time: 12/19/18  2:24 PM  Result Value Ref Range   Cholesterol, Total 368 (H) 100 - 199 mg/dL   Triglycerides 397 (H) 0 - 149 mg/dL   HDL 51 >39 mg/dL   VLDL Cholesterol Cal 79 (H) 5 - 40 mg/dL   LDL Calculated 238 (H) 0 - 99 mg/dL   Chol/HDL Ratio 7.2 (H) 0.0 - 4.4 ratio    Comment:                                   T. Chol/HDL Ratio                                             Men  Women                               1/2 Avg.Risk  3.4    3.3  Avg.Risk  5.0    4.4                                2X Avg.Risk  9.6    7.1                                3X Avg.Risk 23.4   11.0   Thyroid Panel With TSH     Status: Abnormal   Collection Time: 12/19/18  2:24 PM  Result Value Ref Range   TSH 4.960 (H) 0.450 - 4.500 uIU/mL   T4, Total 5.8 4.5 - 12.0 ug/dL    T3 Uptake Ratio 25 24 - 39 %   Free Thyroxine Index 1.5 1.2 - 4.9  Lithium level     Status: Abnormal   Collection Time: 12/19/18  2:24 PM  Result Value Ref Range   Lithium Lvl 0.5 (L) 0.6 - 1.2 mmol/L    Comment:                                  Detection Limit = 0.1                           <0.1 indicates None Detected     There were no vitals taken for this visit.   Review of Systems  Cardiovascular: Negative for chest pain.  Neurological: Negative for tremors.  Psychiatric/Behavioral: Negative for substance abuse and suicidal ideas.    Mental Status Examination  Appearance:  Alert: Yes Attention: fair  Cooperative: Yes Eye Contact:  Speech: normal Psychomotor Activity: Decreased Memory/Concentration: adequate Oriented: person, place, time/date and situation Mood:fair Affect: congruent Thought Processes and Associations: Coherent no paranoia as of now [employer] of Knowledge: Fair Thought Content: Suicidal ideation and Homicidal ideation were denied Insight: Fair Judgement: Fair  Diagnosis: Bipolar disorder type I manic per history for psychotic features. Insomnia NOS. Adjustment disorder with anxiety . EPS Treatment Plan:   1. Bipolar: depression doing stable on lithium, does not want to cut down. lihtium leel 0.5. TSh slighly up , says will ask pcp to consider thyroid hormone or repeat test  2. Depression:better.continue zoloft. Not paranoid 3. Insomnia:irregular sleep hygiene reviewed. meds and labs reviewed.  Fu 3-23m Refill sent  I discussed the assessment and treatment plan with the patient. The patient was provided an opportunity to ask questions and all were answered. The patient agreed with the plan and demonstrated an understanding of the instructions.   The patient was advised to call back or seek an in-person evaluation if the symptoms worsen or if the condition fails to improve as anticipated.  I provided 15  minutes of non-face-to-face time during  this encounter.  NMerian Capron MD

## 2018-12-26 ENCOUNTER — Other Ambulatory Visit: Payer: Self-pay | Admitting: Family Medicine

## 2018-12-26 DIAGNOSIS — Z1231 Encounter for screening mammogram for malignant neoplasm of breast: Secondary | ICD-10-CM

## 2019-01-28 DIAGNOSIS — M79671 Pain in right foot: Secondary | ICD-10-CM | POA: Diagnosis not present

## 2019-01-28 DIAGNOSIS — M25571 Pain in right ankle and joints of right foot: Secondary | ICD-10-CM | POA: Diagnosis not present

## 2019-02-25 DIAGNOSIS — M25571 Pain in right ankle and joints of right foot: Secondary | ICD-10-CM | POA: Diagnosis not present

## 2019-03-10 ENCOUNTER — Ambulatory Visit (INDEPENDENT_AMBULATORY_CARE_PROVIDER_SITE_OTHER): Payer: Medicare Other

## 2019-03-10 ENCOUNTER — Other Ambulatory Visit: Payer: Self-pay

## 2019-03-10 ENCOUNTER — Other Ambulatory Visit: Payer: Self-pay | Admitting: Family Medicine

## 2019-03-10 ENCOUNTER — Encounter: Payer: Self-pay | Admitting: Family Medicine

## 2019-03-10 ENCOUNTER — Ambulatory Visit (INDEPENDENT_AMBULATORY_CARE_PROVIDER_SITE_OTHER): Payer: Medicare Other | Admitting: Family Medicine

## 2019-03-10 VITALS — BP 129/80 | HR 101 | Temp 98.0°F | Wt 215.0 lb

## 2019-03-10 DIAGNOSIS — M25561 Pain in right knee: Secondary | ICD-10-CM

## 2019-03-10 DIAGNOSIS — M1712 Unilateral primary osteoarthritis, left knee: Secondary | ICD-10-CM | POA: Diagnosis not present

## 2019-03-10 DIAGNOSIS — M1711 Unilateral primary osteoarthritis, right knee: Secondary | ICD-10-CM

## 2019-03-10 MED ORDER — HYDROCODONE-ACETAMINOPHEN 5-325 MG PO TABS: 1.0000 | ORAL_TABLET | Freq: Four times a day (QID) | ORAL | 0 refills | Status: DC | PRN

## 2019-03-10 MED ORDER — DICLOFENAC SODIUM 1 % TD GEL: 4.0000 g | Freq: Four times a day (QID) | TRANSDERMAL | 11 refills | Status: DC

## 2019-03-10 NOTE — Progress Notes (Signed)
Amber Rice is a 63 y.o. female who presents to [facility] today for right knee pain x 2 months.  Started about 2 months ago she stood up and felt like her right knee buckled a little bit in a hyperextension.  She has had pain since.  Pain is felt primarily at the medial compartment.  She has pain is worse with activity and at rest.  She denies severe swelling.    Have tried tylenol and ibuprofen. Tried ice, heat and voltaren gel.  So far treatments have not been helpful.  Symptoms are moderate and do interfere with quality of life.    ROS:  As above  Exam:  BP 129/80    Pulse (!) 101    Temp 98 F (36.7 C) (Oral)    Wt 215 lb (97.5 kg)    BMI 35.78 kg/m  Wt Readings from Last 5 Encounters:  03/10/19 215 lb (97.5 kg)  12/19/18 212 lb (96.2 kg)  01/28/18 189 lb (85.7 kg)  11/07/17 190 lb (86.2 kg)  05/09/17 186 lb (84.4 kg)   General: Well Developed, well nourished, and in no acute distress.  Neuro/Psych: Alert and oriented x3, extra-ocular muscles intact, able to move all 4 extremities, sensation grossly intact. Skin: Warm and dry, no rashes noted.  Respiratory: Not using accessory muscles, speaking in full sentences, trachea midline.  Cardiovascular: Pulses palpable, no extremity edema. Abdomen: Does not appear distended. MSK:  Right knee: Minimal effusion. Range of motion 0-120 degrees without crepitations. Tender palpation medial joint line. Stable ligamentous exam.  Intact strength.  Minimally positive medial McMurray's test.      Lab and Radiology Results X-ray images right knee obtained today personally independently reviewed. Mild medial compartment DJD with no acute fractures or severe degenerative changes present. Await formal radiology over read.  Limited musculoskeletal ultrasound reveals mild joint effusion.  Normal bony structures.  Preserved joint spaces with intact appearing medial and lateral meniscus on  ultrasound.  Intact patella and quad tendon.  Procedure: Real-time Ultrasound Guided Injection of right knee Device: GE Logiq E   Images permanently stored and available for review in the ultrasound unit. Verbal informed consent obtained.  Discussed risks and benefits of procedure. Warned about infection bleeding damage to structures skin hypopigmentation and fat atrophy among others. Patient expresses understanding and agreement Time-out conducted.   Noted no overlying erythema, induration, or other signs of local infection.   Skin prepped in a sterile fashion.   Local anesthesia: Topical Ethyl chloride.   With sterile technique and under real time ultrasound guidance:  80 mg of Kenalog and 3 mL of Marcaine injected easily.   Completed without difficulty   Pain immediately resolved suggesting accurate placement of the medication.   Advised to call if fevers/chills, erythema, induration, drainage, or persistent bleeding.   Images permanently stored and available for review in the ultrasound unit.  Impression: Technically successful ultrasound guided injection.         Assessment and Plan: 63 y.o. female with right knee pain present for about 2 months.  No significant x-ray changes.  No acute injury history.  History and exam somewhat consistent with degenerative meniscus tear.  X-ray and ultrasound largely unremarkable.  Plan for injection today.  Proceed with continue diclofenac gel.  Limited hydrocodone for pain control.  Recheck in about a month.  Also work on quad strength and weight loss.   PDMP reviewed during this encounter. No orders of the defined types  were placed in this encounter.  Meds ordered this encounter  Medications   diclofenac sodium (VOLTAREN) 1 % GEL    Sig: Apply 4 g topically 4 (four) times daily. To affected joint.    Dispense:  100 g    Refill:  11   HYDROcodone-acetaminophen (NORCO/VICODIN) 5-325 MG tablet    Sig: Take 1 tablet by mouth every 6  (six) hours as needed.    Dispense:  15 tablet    Refill:  0    Historical information moved to improve visibility of documentation.  Past Medical History:  Diagnosis Date   Degenerative disc disease, lumbar    diagnosed in 2005   Hyperlipidemia    Impacted cerumen of left ear 04/23/2017   Past Surgical History:  Procedure Laterality Date   ABDOMINAL HYSTERECTOMY     APPENDECTOMY     LAPAROTOMY     Social History   Tobacco Use   Smoking status: Current Every Day Smoker    Packs/day: 0.50    Years: 44.00    Pack years: 22.00    Types: Cigarettes    Last attempt to quit: 10/30/2016    Years since quitting: 2.3   Smokeless tobacco: Never Used  Substance Use Topics   Alcohol use: No   family history includes Bipolar disorder in her paternal uncle; Cancer in her maternal grandmother, mother, and paternal grandmother; Dementia in her paternal uncle; Depression in her cousin; Heart attack in her father; Hyperlipidemia in her father; Multiple sclerosis in her sister; Schizophrenia in her cousin.  Medications: Current Outpatient Medications  Medication Sig Dispense Refill   lithium carbonate (LITHOBID) 300 MG CR tablet Take 1 tablet (300 mg total) by mouth 2 (two) times daily. 180 tablet 0   sertraline (ZOLOFT) 100 MG tablet Take 1 tablet (100 mg total) by mouth 2 (two) times daily. 180 tablet 0   diclofenac sodium (VOLTAREN) 1 % GEL Apply 4 g topically 4 (four) times daily. To affected joint. 100 g 11   HYDROcodone-acetaminophen (NORCO/VICODIN) 5-325 MG tablet Take 1 tablet by mouth every 6 (six) hours as needed. 15 tablet 0   No current facility-administered medications for this visit.    Allergies  Allergen Reactions   Lipitor [Atorvastatin] Swelling      Discussed warning signs or symptoms. Please see discharge instructions. Patient expresses understanding.

## 2019-03-10 NOTE — Patient Instructions (Signed)
Thank you for coming in today. USe hydrocodone limited.  Work on straight leg raises.  Call or go to the ER if you develop a large red swollen joint with extreme pain or oozing puss.    Recheck in 1 month.  Return sooner if needed.     Meniscus Tear  A meniscus tear is a knee injury that happens when a piece of the meniscus is torn. The meniscus is a thick, rubbery, wedge-shaped cartilage in the knee. Two menisci are located in each knee. They sit between the upper bone (femur) and lower bone (tibia) that make up the knee joint. Each meniscus acts as a shock absorber for the knee. A torn meniscus is one of the most common types of knee injuries. This injury can range from mild to severe. Surgery may be needed to repair a severe tear. What are the causes? This condition may be caused by any kneeling, squatting, twisting, or pivoting movement. Sports-related injuries are the most common cause. These often occur from:  Running and stopping suddenly. ? Changing direction. ? Being tackled or knocked off your feet.  Lifting or carrying heavy weights. As people get older, their menisci get thinner and weaker. In these people, tears can happen more easily, such as from climbing stairs. What increases the risk? You are more likely to develop this condition if you:  Play contact sports.  Have a job that requires kneeling or squatting.  Are female.  Are over 28 years old. What are the signs or symptoms? Symptoms of this condition include:  Knee pain, especially at the side of the knee joint. You may feel pain when the injury occurs, or you may only hear a pop and feel pain later.  A feeling that your knee is clicking, catching, locking, or giving way.  Not being able to fully bend or extend your knee.  Bruising or swelling in your knee. How is this diagnosed? This condition may be diagnosed based on your symptoms and a physical exam. You may also have tests, such as:  X-rays.   MRI.  A procedure to look inside your knee with a narrow surgical telescope (arthroscopy). You may be referred to a knee specialist (orthopedic surgeon). How is this treated? Treatment for this injury depends on the severity of the tear. Treatment for a mild tear may include:  Rest.  Medicine to reduce pain and swelling. This is usually a nonsteroidal anti-inflammatory drug (NSAID), like ibuprofen.  A knee brace, sleeve, or wrap.  Using crutches or a walker to keep weight off your knee and to help you walk.  Exercises to strengthen your knee (physical therapy). You may need surgery if you have a severe tear or if other treatments are not working. Follow these instructions at home: If you have a brace, sleeve, or wrap:  Wear it as told by your health care provider. Remove it only as told by your health care provider.  Loosen the brace, sleeve, or wrap if your toes tingle, become numb, or turn cold and blue.  Keep the brace, sleeve, or wrap clean and dry.  If the brace, sleeve, or wrap is not waterproof: ? Do not let it get wet. ? Cover it with a watertight covering when you take a bath or shower. Managing pain and swelling   Take over-the-counter and prescription medicines only as told by your health care provider.  If directed, put ice on your knee: ? If you have a removable brace, sleeve, or wrap,  remove it as told by your health care provider. ? Put ice in a plastic bag. ? Place a towel between your skin and the bag. ? Leave the ice on for 20 minutes, 2-3 times per day.  Move your toes often to avoid stiffness and to lessen swelling.  Raise (elevate) the injured area above the level of your heart while you are sitting or lying down. Activity  Do not use the injured limb to support your body weight until your health care provider says that you can. Use crutches or a walker as told by your health care provider.  Return to your normal activities as told by your health  care provider. Ask your health care provider what activities are safe for you.  Perform range-of-motion exercises only as told by your health care provider.  Begin doing exercises to strengthen your knee and leg muscles only as told by your health care provider. After you recover, your health care provider may recommend these exercises to help prevent another injury. General instructions  Use a knee brace, sleeve, or wrap as told by your health care provider.  Ask your health care provider when it is safe to drive if you have a brace, sleeve, or wrap on your knee.  Do not use any products that contain nicotine or tobacco, such as cigarettes, e-cigarettes, and chewing tobacco. If you need help quitting, ask your health care provider.  Ask your health care provider if the medicine prescribed to you: ? Requires you to avoid driving or using heavy machinery. ? Can cause constipation. You may need to take these actions to prevent or treat constipation:  Drink enough fluid to keep your urine pale yellow.  Take over-the-counter or prescription medicines.  Eat foods that are high in fiber, such as beans, whole grains, and fresh fruits and vegetables.  Limit foods that are high in fat and processed sugars, such as fried or sweet foods.  Keep all follow-up visits as told by your health care provider. This is important. Contact a health care provider if:  You have a fever.  Your knee becomes red, tender, or swollen.  Your pain medicine is not helping.  Your symptoms get worse or do not improve after 2 weeks of home care. Summary  A meniscus tear is a knee injury that happens when a piece of the meniscus is torn.  Treatment for this injury depends on the severity of the tear. You may need surgery if you have a severe tear or if other treatments are not working.  Rest, ice, and raise (elevate) your injured knee as told by your health care provider. This will help lessen pain and swelling.   Contact a health care provider if you have new symptoms, or your symptoms get worse or do not improve after 2 weeks of home care.  Keep all follow-up visits as told by your health care provider. This is important. This information is not intended to replace advice given to you by your health care provider. Make sure you discuss any questions you have with your health care provider. Document Released: 10/14/2002 Document Revised: 02/05/2018 Document Reviewed: 02/05/2018 Elsevier Patient Education  2020 [employer].

## 2019-04-03 ENCOUNTER — Encounter (HOSPITAL_COMMUNITY): Payer: Self-pay | Admitting: Psychiatry

## 2019-04-03 ENCOUNTER — Ambulatory Visit (INDEPENDENT_AMBULATORY_CARE_PROVIDER_SITE_OTHER): Payer: Medicare Other | Admitting: Psychiatry

## 2019-04-03 DIAGNOSIS — G259 Extrapyramidal and movement disorder, unspecified: Secondary | ICD-10-CM | POA: Diagnosis not present

## 2019-04-03 DIAGNOSIS — F5102 Adjustment insomnia: Secondary | ICD-10-CM

## 2019-04-03 DIAGNOSIS — G8929 Other chronic pain: Secondary | ICD-10-CM | POA: Diagnosis not present

## 2019-04-03 DIAGNOSIS — F315 Bipolar disorder, current episode depressed, severe, with psychotic features: Secondary | ICD-10-CM

## 2019-04-03 MED ORDER — SERTRALINE HCL 100 MG PO TABS: 100.0000 mg | ORAL_TABLET | Freq: Two times a day (BID) | ORAL | 0 refills | Status: DC

## 2019-04-03 MED ORDER — LITHIUM CARBONATE ER 300 MG PO TBCR: 300.0000 mg | EXTENDED_RELEASE_TABLET | Freq: Two times a day (BID) | ORAL | 0 refills | Status: DC

## 2019-04-03 NOTE — Progress Notes (Signed)
Patient ID: Amber Rice, female   DOB: February 06, 1956, 63 y.o.   MRN: [id number] 63 y.o.  04/03/2019  Chief Complaint: Follow up bipolar disorder. Med review   I connected with Amber Rice on 04/03/19 at  1:30 PM EDT by telephone and verified that I am speaking with the correct person using two identifiers.   I discussed the limitations, risks, security and privacy concerns of performing an evaluation and management service by telephone and the availability of in person appointments. I also discussed with the patient that there may be a patient responsible charge related to this service. The patient expressed understanding and agreed to proceed.  History of Present Illness:   Diagnosed with bipolar disorder depressed. Anxiety disorder NOS. Insomnia  Doing fair regarding mood, had back pain and now following with Dr. [name] for possible pain injection, it has helped before Labs checked by primary care she states lithium and TSh were done and TSH was wnl Denies tremors    no paranoia    depression better on zoloft as well.   Duration of bipolar : 12 years  Aggravating factor back condition Depression scale 7/10 . 10 being no depression  Modifying factors; walking when she can . Reading books    Past Medical History:  Diagnosis Date  . Degenerative disc disease, lumbar    diagnosed in 2005  . Hyperlipidemia   . Impacted cerumen of left ear 04/23/2017   Family History  Problem Relation Age of Onset  . Cancer Mother   . Heart attack Father   . Hyperlipidemia Father   . Cancer Maternal Grandmother   . Cancer Paternal Grandmother   . Bipolar disorder Paternal Uncle        Committed suicide in his 41's  . Dementia Paternal Uncle   . Depression Cousin   . Schizophrenia Cousin   . Multiple sclerosis Sister   . Alcohol abuse Neg Hx   . Anxiety disorder Neg Hx     Outpatient Encounter  Medications as of 04/03/2019  Medication Sig  . diclofenac sodium (VOLTAREN) 1 % GEL Apply 4 g topically 4 (four) times daily. To affected joint.  Marland Kitchen HYDROcodone-acetaminophen (NORCO/VICODIN) 5-325 MG tablet Take 1 tablet by mouth every 6 (six) hours as needed.  . lithium carbonate (LITHOBID) 300 MG CR tablet Take 1 tablet (300 mg total) by mouth 2 (two) times daily.  . sertraline (ZOLOFT) 100 MG tablet Take 1 tablet (100 mg total) by mouth 2 (two) times daily.  . [DISCONTINUED] lithium carbonate (LITHOBID) 300 MG CR tablet Take 1 tablet (300 mg total) by mouth 2 (two) times daily.  . [DISCONTINUED] mirtazapine (REMERON) 7.5 MG tablet TAKE 1 TABLET (7.5 MG TOTAL) BY MOUTH AT BEDTIME. (Patient not taking: Reported on 07/08/2018)  . [DISCONTINUED] QUEtiapine (SEROQUEL) 50 MG tablet Take 1 tablet (50 mg total) by mouth at bedtime. (Patient not taking: Reported on 02/05/2018)  . [DISCONTINUED] risperiDONE (RISPERDAL) 1 MG tablet TAKE 1/2 TABLET BY MOUTH AT BEDTIME FOR A FEW DAYS THEN INCREASE TO 1 TABLET DAILY (Patient not taking: Reported on 07/08/2018)  . [DISCONTINUED] sertraline (ZOLOFT) 100 MG tablet Take 1 tablet (100 mg total) by mouth 2 (two) times daily.   No facility-administered encounter medications on file as of 04/03/2019.     No results found for this or any previous visit (from the past 2160 hour(s)).  There were no vitals taken for this visit.   Review of Systems  Cardiovascular: Negative for chest pain.  Musculoskeletal: Positive for back pain.  Neurological: Negative for tremors.  Psychiatric/Behavioral: Negative for substance abuse and suicidal ideas.    Mental Status Examination  Appearance:  Alert: Yes Attention: fair  Cooperative: Yes Eye Contact:  Speech: normal Psychomotor Activity: Decreased Memory/Concentration: adequate Oriented: person, place, time/date and situation Mood: fair Affect: congruent Thought Processes and Associations: Coherent no paranoia as of  now [employer] of Knowledge: Fair Thought Content: Suicidal ideation and Homicidal ideation were denied Insight: Fair Judgement: Fair  Diagnosis: Bipolar disorder type I manic per history for psychotic features. Insomnia NOS. Adjustment disorder with anxiety . EPS Treatment Plan:   1. Bipolar: depression  Stable on meds continue lithium, last one recorded reveiwed 2. Depression:better.continue zoloft. Not paranoid 3. Insomnia:irregular sleep hygiene reviewed. Back condition can effect sleep. Is on meds  Fu 3-19m.  I discussed the assessment and treatment plan with the patient. The patient was provided an opportunity to ask questions and all were answered. The patient agreed with the plan and demonstrated an understanding of the instructions.   The patient was advised to call back or seek an in-person evaluation if the symptoms worsen or if the condition fails to improve as anticipated.  I provided 15  minutes of non-face-to-face time during this encounter.  [name], MD

## 2019-04-10 ENCOUNTER — Ambulatory Visit (INDEPENDENT_AMBULATORY_CARE_PROVIDER_SITE_OTHER): Payer: Medicare Other

## 2019-04-10 ENCOUNTER — Encounter: Payer: Self-pay | Admitting: Family Medicine

## 2019-04-10 ENCOUNTER — Other Ambulatory Visit: Payer: Self-pay

## 2019-04-10 ENCOUNTER — Ambulatory Visit (INDEPENDENT_AMBULATORY_CARE_PROVIDER_SITE_OTHER): Payer: Medicare Other | Admitting: Family Medicine

## 2019-04-10 VITALS — BP 129/80 | HR 87 | Temp 98.3°F | Wt 205.0 lb

## 2019-04-10 DIAGNOSIS — M5442 Lumbago with sciatica, left side: Secondary | ICD-10-CM

## 2019-04-10 DIAGNOSIS — M5441 Lumbago with sciatica, right side: Secondary | ICD-10-CM | POA: Diagnosis not present

## 2019-04-10 DIAGNOSIS — Z1231 Encounter for screening mammogram for malignant neoplasm of breast: Secondary | ICD-10-CM

## 2019-04-10 MED ORDER — GABAPENTIN 300 MG PO CAPS: ORAL_CAPSULE | ORAL | 3 refills | Status: DC

## 2019-04-10 MED ORDER — PREDNISONE 50 MG PO TABS: 50.0000 mg | ORAL_TABLET | Freq: Every day | ORAL | 0 refills | Status: DC

## 2019-04-10 MED ORDER — HYDROCODONE-ACETAMINOPHEN 5-325 MG PO TABS: 1.0000 | ORAL_TABLET | Freq: Four times a day (QID) | ORAL | 0 refills | Status: DC | PRN

## 2019-04-10 NOTE — Patient Instructions (Signed)
Thank you for coming in today. Come back or go to the emergency room if you notice new weakness new numbness problems walking or bowel or bladder problems. Take the prednisone for 5 days.  If you get a lot better really quickly ok to reduce the dose or stop early.  Take gabapentin for nerve pain.  Ok to take as needed or on a schedule.  Ok to increase the dose quickly if tolerating well.   Use hydrocodone for severe pain.   If not improving let me know and I will likely proceed to epidural steroid injection.   Consider heating pad and TENS unit.   TENS UNIT: This is helpful for muscle pain and spasm.   Search and Purchase a TENS 7000 2nd edition at  www.tenspros.com or www.[place].com It should be less than $30.     TENS unit instructions: Do not shower or bathe with the unit on Turn the unit off before removing electrodes or batteries If the electrodes lose stickiness add a drop of water to the electrodes after they are disconnected from the unit and place on plastic sheet. If you continued to have difficulty, call the TENS unit company to purchase more electrodes. Do not apply lotion on the skin area prior to use. Make sure the skin is clean and dry as this will help prolong the life of the electrodes. After use, always check skin for unusual red areas, rash or other skin difficulties. If there are any skin problems, does not apply electrodes to the same area. Never remove the electrodes from the unit by pulling the wires. Do not use the TENS unit or electrodes other than as directed. Do not change electrode placement without consultating your therapist or physician. Keep 2 fingers with between each electrode. Wear time ratio is 2:1, on to off times.    For example on for 30 minutes off for 15 minutes and then on for 30 minutes off for 15 minutes     Sciatica  Sciatica is pain, numbness, weakness, or tingling along the path of the sciatic nerve. The sciatic nerve starts in  the lower back and runs down the back of each leg. The nerve controls the muscles in the lower leg and in the back of the knee. It also provides feeling (sensation) to the back of the thigh, the lower leg, and the sole of the foot. Sciatica is a symptom of another medical condition that pinches or puts pressure on the sciatic nerve. Sciatica most often only affects one side of the body. Sciatica usually goes away on its own or with treatment. In some cases, sciatica may come back (recur). What are the causes? This condition is caused by pressure on the sciatic nerve or pinching of the nerve. This may be the result of:  A disk in between the bones of the spine bulging out too far (herniated disk).  Age-related changes in the spinal disks.  A pain disorder that affects a muscle in the buttock.  Extra bone growth near the sciatic nerve.  A break (fracture) of the pelvis.  Pregnancy.  Tumor. This is rare. What increases the risk? The following factors may make you more likely to develop this condition:  Playing sports that place pressure or stress on the spine.  Having poor strength and flexibility.  A history of back injury or surgery.  Sitting for long periods of time.  Doing activities that involve repetitive bending or lifting.  Obesity. What are the signs or  symptoms? Symptoms can vary from mild to very severe, and they may include:  Any of these problems in the lower back, leg, hip, or buttock: ? Mild tingling, numbness, or dull aches. ? Burning sensations. ? Sharp pains.  Numbness in the back of the calf or the sole of the foot.  Leg weakness.  Severe back pain that makes movement difficult. Symptoms may get worse when you cough, sneeze, or laugh, or when you sit or stand for long periods of time. How is this diagnosed? This condition may be diagnosed based on:  Your symptoms and medical history.  A physical exam.  Blood tests.  Imaging tests, such as: ?  X-rays. ? MRI. ? CT scan. How is this treated? In many cases, this condition improves on its own without treatment. However, treatment may include:  Reducing or modifying physical activity.  Exercising and stretching.  Icing and applying heat to the affected area.  Medicines that help to: ? Relieve pain and swelling. ? Relax your muscles.  Injections of medicines that help to relieve pain, irritation, and inflammation around the sciatic nerve (steroids).  Surgery. Follow these instructions at home: Medicines  Take over-the-counter and prescription medicines only as told by your health care provider.  Ask your health care provider if the medicine prescribed to you: ? Requires you to avoid driving or using heavy machinery. ? Can cause constipation. You may need to take these actions to prevent or treat constipation:  Drink enough fluid to keep your urine pale yellow.  Take over-the-counter or prescription medicines.  Eat foods that are high in fiber, such as beans, whole grains, and fresh fruits and vegetables.  Limit foods that are high in fat and processed sugars, such as fried or sweet foods. Managing pain      If directed, put ice on the affected area. ? Put ice in a plastic bag. ? Place a towel between your skin and the bag. ? Leave the ice on for 20 minutes, 2-3 times a day.  If directed, apply heat to the affected area. Use the heat source that your health care provider recommends, such as a moist heat pack or a heating pad. ? Place a towel between your skin and the heat source. ? Leave the heat on for 20-30 minutes. ? Remove the heat if your skin turns bright red. This is especially important if you are unable to feel pain, heat, or cold. You may have a greater risk of getting burned. Activity   Return to your normal activities as told by your health care provider. Ask your health care provider what activities are safe for you.  Avoid activities that make  your symptoms worse.  Take brief periods of rest throughout the day. ? When you rest for longer periods, mix in some mild activity or stretching between periods of rest. This will help to prevent stiffness and pain. ? Avoid sitting for long periods of time without moving. Get up and move around at least one time each hour.  Exercise and stretch regularly, as told by your health care provider.  Do not lift anything that is heavier than 10 lb (4.5 kg) while you have symptoms of sciatica. When you do not have symptoms, you should still avoid heavy lifting, especially repetitive heavy lifting.  When you lift objects, always use proper lifting technique, which includes: ? Bending your knees. ? Keeping the load close to your body. ? Avoiding twisting. General instructions  Maintain a healthy weight. Excess  weight puts extra stress on your back.  Wear supportive, comfortable shoes. Avoid wearing high heels.  Avoid sleeping on a mattress that is too soft or too hard. A mattress that is firm enough to support your back when you sleep may help to reduce your pain.  Keep all follow-up visits as told by your health care provider. This is important. Contact a health care provider if:  You have pain that: ? Wakes you up when you are sleeping. ? Gets worse when you lie down. ? Is worse than you have experienced in the past. ? Lasts longer than 4 weeks.  You have an unexplained weight loss. Get help right away if:  You are not able to control when you urinate or have bowel movements (incontinence).  You have: ? Weakness in your lower back, pelvis, buttocks, or legs that gets worse. ? Redness or swelling of your back. ? A burning sensation when you urinate. Summary  Sciatica is pain, numbness, weakness, or tingling along the path of the sciatic nerve.  This condition is caused by pressure on the sciatic nerve or pinching of the nerve.  Sciatica can cause pain, numbness, or tingling in  the lower back, legs, hips, and buttocks.  Treatment often includes rest, exercise, medicines, and applying ice or heat. This information is not intended to replace advice given to you by your health care provider. Make sure you discuss any questions you have with your health care provider. Document Released: 07/18/2001 Document Revised: 08/12/2018 Document Reviewed: 08/12/2018 Elsevier Patient Education  [place].

## 2019-04-10 NOTE — Progress Notes (Signed)
Amber Rice is a 63 y.o. female who presents to [facility] today for low back pain radiating bilateral legs.  Amber Rice developed low back pain radiating bilateral legs on August 18.  She notes pain is confined to the bilateral low back left worse than right.  With standing she notes severe pain radiating down the posterior legs bilaterally.  She denies any weakness bowel bladder dysfunction or saddle anesthesia.  She had somewhat similar pain in 2017.  She had MRI 2016 that showed degenerative changes and some bulging disks listed below.  She did respond to SI joint injection but at that time did not have radicular symptoms.  She rates her pain as severe.  She is tried heating pad and some hydrocodone that she was prescribed for her knee pain listed below which helped a little.   Patient was seen on March 10, 2019 for right knee pain.  Exam and history was consistent with degenerative meniscus tear.  X-ray and ultrasound were largely unremarkable.  She was treated with intra-articular injection and diclofenac gel and limited hydrocodone as well as home exercise program and weight loss.  She notes complete resolution of her knee pain.   ROS:  As above  Exam:  BP 129/80   Pulse 87   Temp 98.3 F (36.8 C) (Oral)   Wt 205 lb (93 kg)   BMI 34.11 kg/m  Wt Readings from Last 5 Encounters:  04/10/19 205 lb (93 kg)  03/10/19 215 lb (97.5 kg)  12/19/18 212 lb (96.2 kg)  01/28/18 189 lb (85.7 kg)  11/07/17 190 lb (86.2 kg)   General: Well Developed, well nourished, and in no acute distress.  Neuro/Psych: Alert and oriented x3, extra-ocular muscles intact, able to move all 4 extremities, sensation grossly intact. Skin: Warm and dry, no rashes noted.  Respiratory: Not using accessory muscles, speaking in full sentences, trachea midline.  Cardiovascular: Pulses palpable, no extremity edema. Abdomen: Does not appear distended. MSK:  L-spine:  Nontender to spinal midline.  Tender palpation lumbar paraspinal musculature bilaterally left worse than right. Lumbar motion normal flexion decreased extension resulting in pain. Lower extremity reflexes sensation and strength are equal normal throughout bilaterally. Negative slump test bilaterally.    Lab and Radiology Results EXAM: MRI LUMBAR SPINE WITHOUT CONTRAST  TECHNIQUE: Multiplanar, multisequence MR imaging of the lumbar spine was performed. No intravenous contrast was administered.  COMPARISON:  12/31/2013 plain film exam.  FINDINGS: Last fully open disk space is labeled L5-S1. Present examination incorporates from T11 through the lower S2 level.  Conus L1 level.  Tiny left lower pole T2 bright structures probably cysts. No worrisome paravertebral abnormality noted.  T11-12 and T12-L1:  Negative.  L1-2: Minimal Schmorl's node deformity. Disc degeneration with disc space narrowing most notable anteriorly with anterior osteophyte. Mild bulge. No significant spinal stenosis or foraminal narrowing.  L2-3:  Slight disc degeneration.  L3-4:  Minimal bulge.  Minimal facet degenerative changes.  L4-5: Bulge with slight caudal extension minimally more notable to the right. Minimal indentation upon the ventral thecal sac. Lateral extension of bulge/osteophyte greater on the right approaching but not compressing the exiting right L4 nerve root. Facet joint degenerative changes.  L5-S1: Facet degenerative changes. Slightly elongated pars without pars defect. 2 mm anterior slip L5. Bulge with slight extension into the inferior aspect of the neural foramen. Mild foraminal narrowing greater on the left with slight encroachment upon but not compression of the exiting L5 nerve roots.  IMPRESSION:  L4-5 bulge minimally more notable to the right. Minimal indentation upon the ventral thecal sac. Lateral extension of bulge/osteophyte greater on the right  approaching but not compressing the exiting right L4 nerve root.  L5-S1 facet degenerative changes. 2 mm anterior slip L5. Bulge with slight extension into the inferior aspect of the neural foramen. Mild foraminal narrowing greater on the left with slight encroachment upon but not compression of the exiting L5 nerve roots.  L1-2 disc degeneration with mild bulge without spinal stenosis or foraminal narrowing.   Electronically Signed   By: Genia Del M.D.   On: 05/10/2015 15:28 I personally (independently) visualized and performed the interpretation of the images attached in this note.     Assessment and Plan: 63 y.o. female with acute low back pain radiating bilateral legs.  Likely secondary to spinal stenosis or neuroforaminal stenosis.  MRI from a few years ago was a bit out of date but could support the pain that she is having now.  Discussed treatment options.  Plan for trial of oral prednisone and gabapentin.  Additionally TENS unit heating pad.  Patient declined physical therapy.  Limited Norco for pain control.  If not improving next up would likely be epidural steroid injection.  Hopefully will not need a repeat MRI at that point.  May consider repeat MRI especially if not better with injections.  Knee pain resolved.   PDMP reviewed during this encounter. No orders of the defined types were placed in this encounter.  Meds ordered this encounter  Medications  . predniSONE (DELTASONE) 50 MG tablet    Sig: Take 1 tablet (50 mg total) by mouth daily.    Dispense:  5 tablet    Refill:  0  . gabapentin (NEURONTIN) 300 MG capsule    Sig: One tab PO qHS for a week, then BID for a week, then TID. May double weekly to a max of 3,600mg /day    Dispense:  180 capsule    Refill:  3  . HYDROcodone-acetaminophen (NORCO/VICODIN) 5-325 MG tablet    Sig: Take 1 tablet by mouth every 6 (six) hours as needed.    Dispense:  15 tablet    Refill:  0    Historical information  moved to improve visibility of documentation.  Past Medical History:  Diagnosis Date  . Degenerative disc disease, lumbar    diagnosed in 2005  . Hyperlipidemia   . Impacted cerumen of left ear 04/23/2017   Past Surgical History:  Procedure Laterality Date  . ABDOMINAL HYSTERECTOMY    . APPENDECTOMY    . LAPAROTOMY     Social History   Tobacco Use  . Smoking status: Current Every Day Smoker    Packs/day: 0.50    Years: 44.00    Pack years: 22.00    Types: Cigarettes    Last attempt to quit: 10/30/2016    Years since quitting: 2.4  . Smokeless tobacco: Never Used  Substance Use Topics  . Alcohol use: No   family history includes Bipolar disorder in her paternal uncle; Cancer in her maternal grandmother, mother, and paternal grandmother; Dementia in her paternal uncle; Depression in her cousin; Heart attack in her father; Hyperlipidemia in her father; Multiple sclerosis in her sister; Schizophrenia in her cousin.  Medications: Current Outpatient Medications  Medication Sig Dispense Refill  . diclofenac sodium (VOLTAREN) 1 % GEL Apply 4 g topically 4 (four) times daily. To affected joint. 100 g 11  . gabapentin (NEURONTIN) 300 MG capsule One tab  PO qHS for a week, then BID for a week, then TID. May double weekly to a max of 3,600mg /day 180 capsule 3  . HYDROcodone-acetaminophen (NORCO/VICODIN) 5-325 MG tablet Take 1 tablet by mouth every 6 (six) hours as needed. 15 tablet 0  . lithium carbonate (LITHOBID) 300 MG CR tablet Take 1 tablet (300 mg total) by mouth 2 (two) times daily. 180 tablet 0  . predniSONE (DELTASONE) 50 MG tablet Take 1 tablet (50 mg total) by mouth daily. 5 tablet 0  . sertraline (ZOLOFT) 100 MG tablet Take 1 tablet (100 mg total) by mouth 2 (two) times daily. 180 tablet 0   No current facility-administered medications for this visit.    Allergies  Allergen Reactions  . Lipitor [Atorvastatin] Swelling      Discussed warning signs or symptoms. Please see  discharge instructions. Patient expresses understanding.

## 2019-04-15 ENCOUNTER — Telehealth (HOSPITAL_COMMUNITY): Payer: Self-pay | Admitting: Psychiatry

## 2019-04-15 MED ORDER — SERTRALINE HCL 100 MG PO TABS: 100.0000 mg | ORAL_TABLET | Freq: Two times a day (BID) | ORAL | 0 refills | Status: DC

## 2019-04-15 NOTE — Telephone Encounter (Signed)
[facility] [place] However [facility] raised the price to over $100.  Patient called [facility] in [place] [address] and it is much cheaper. Can we recall the rx to that pharmacy

## 2019-04-15 NOTE — Telephone Encounter (Signed)
Ok I sent to [facility] but [facility]

## 2019-04-15 NOTE — Telephone Encounter (Signed)
[facility] [place] [facility].  Called patient and made her aware rx was sent. Nothing Further Needed at this time.

## 2019-04-16 ENCOUNTER — Ambulatory Visit (HOSPITAL_COMMUNITY): Payer: Medicare Other | Admitting: Psychiatry

## 2019-04-16 ENCOUNTER — Ambulatory Visit: Payer: Medicare Other

## 2019-04-16 ENCOUNTER — Ambulatory Visit: Payer: Medicare Other | Admitting: Family Medicine

## 2019-06-23 ENCOUNTER — Other Ambulatory Visit: Payer: Self-pay

## 2019-06-23 ENCOUNTER — Encounter: Payer: Self-pay | Admitting: Family Medicine

## 2019-06-23 ENCOUNTER — Ambulatory Visit (INDEPENDENT_AMBULATORY_CARE_PROVIDER_SITE_OTHER): Payer: Medicare Other | Admitting: Family Medicine

## 2019-06-23 DIAGNOSIS — M791 Myalgia, unspecified site: Secondary | ICD-10-CM

## 2019-06-23 NOTE — Progress Notes (Signed)
Virtual Visit via telephone Note Due to COVID-19 pandemic this visit was conducted virtually. This visit type was conducted due to national recommendations for restrictions regarding the COVID-19 Pandemic (e.g. social distancing, sheltering in place) in an effort to limit this patient's exposure and mitigate transmission in our community. All issues noted in this document were discussed and addressed.  A physical exam was not performed with this format.   I connected with Amber Rice on 06/23/2019 at [facility] by telephone and verified that I am speaking with the correct person using two identifiers. Amber Rice is currently located at home and no one is currently with them during visit. The provider, Monia Pouch, FNP is located in their office at time of visit.  I discussed the limitations, risks, security and privacy concerns of performing an evaluation and management service by telephone and the availability of in person appointments. I also discussed with the patient that there may be a patient responsible charge related to this service. The patient expressed understanding and agreed to proceed.  Subjective:  Patient ID: Amber Rice, female    DOB: July 21, 1956, 63 y.o.   MRN: [id number]  Chief Complaint:  Generalized Body Aches   HPI: Amber Rice is a 63 y.o. female presenting on 06/23/2019 for Generalized Body Aches   Pt reports widespread myalgias for over 6 months. States she has been taking OTC analgesics and the hydrocodone prescribed by ortho. She continues to have pain. She reports she is out of the hydrocodone and needs a refill. Pt states she has not started the gabapentin as she was unsure if she wanted to. She has not had an injury. No other associated symptoms. States the pain is constant and daily. 8/10 at times. Aching all over without loss of function. Denies weakness or numbness/tingling.     Relevant past medical, surgical, family, and social history reviewed  and updated as indicated.  Allergies and medications reviewed and updated.   Past Medical History:  Diagnosis Date   Degenerative disc disease, lumbar    diagnosed in 2005   Hyperlipidemia    Impacted cerumen of left ear 04/23/2017    Past Surgical History:  Procedure Laterality Date   ABDOMINAL HYSTERECTOMY     APPENDECTOMY     LAPAROTOMY      Social History   Socioeconomic History   Marital status: Widowed    Spouse name: Not on file   Number of children: Not on file   Years of education: Not on file   Highest education level: Not on file  Occupational History   Not on file  Social Needs   Financial resource strain: Not on file   Food insecurity    Worry: Not on file    Inability: Not on file   Transportation needs    Medical: Not on file    Non-medical: Not on file  Tobacco Use   Smoking status: Current Every Day Smoker    Packs/day: 0.50    Years: 44.00    Pack years: 22.00    Types: Cigarettes    Last attempt to quit: 10/30/2016    Years since quitting: 2.6   Smokeless tobacco: Never Used  Substance and Sexual Activity   Alcohol use: No   Drug use: No    Comment: Caffeine: Coffee 1-2 cups  per day. 24 Ounce tea, 32-48 ounces pepsI   Sexual activity: Yes  Lifestyle   Physical activity    Days per week: Not on file  Minutes per session: Not on file   Stress: Not on file  Relationships   Social connections    Talks on phone: Not on file    Gets together: Not on file    Attends religious service: Not on file    Active member of club or organization: Not on file    Attends meetings of clubs or organizations: Not on file    Relationship status: Not on file   Intimate partner violence    Fear of current or ex partner: Not on file    Emotionally abused: Not on file    Physically abused: Not on file    Forced sexual activity: Not on file  Other Topics Concern   Not on file  Social History Narrative   Not on file     Outpatient Encounter Medications as of 06/23/2019  Medication Sig   diclofenac sodium (VOLTAREN) 1 % GEL Apply 4 g topically 4 (four) times daily. To affected joint.   gabapentin (NEURONTIN) 300 MG capsule One tab PO qHS for a week, then BID for a week, then TID. May double weekly to a max of 3,600mg /day   lithium carbonate (LITHOBID) 300 MG CR tablet Take 1 tablet (300 mg total) by mouth 2 (two) times daily.   sertraline (ZOLOFT) 100 MG tablet Take 1 tablet (100 mg total) by mouth 2 (two) times daily. [facility]   [DISCONTINUED] HYDROcodone-acetaminophen (NORCO/VICODIN) 5-325 MG tablet Take 1 tablet by mouth every 6 (six) hours as needed.   [DISCONTINUED] mirtazapine (REMERON) 7.5 MG tablet TAKE 1 TABLET (7.5 MG TOTAL) BY MOUTH AT BEDTIME. (Patient not taking: Reported on 07/08/2018)   [DISCONTINUED] predniSONE (DELTASONE) 50 MG tablet Take 1 tablet (50 mg total) by mouth daily.   [DISCONTINUED] QUEtiapine (SEROQUEL) 50 MG tablet Take 1 tablet (50 mg total) by mouth at bedtime. (Patient not taking: Reported on 02/05/2018)   [DISCONTINUED] risperiDONE (RISPERDAL) 1 MG tablet TAKE 1/2 TABLET BY MOUTH AT BEDTIME FOR A FEW DAYS THEN INCREASE TO 1 TABLET DAILY (Patient not taking: Reported on 07/08/2018)   No facility-administered encounter medications on file as of 06/23/2019.     Allergies  Allergen Reactions   Lipitor [Atorvastatin] Swelling    Review of Systems  Constitutional: Negative for activity change, appetite change, chills, diaphoresis, fatigue, fever and unexpected weight change.  HENT: Negative.   Eyes: Negative.  Negative for photophobia and visual disturbance.  Respiratory: Negative for cough, chest tightness and shortness of breath.   Cardiovascular: Negative for chest pain, palpitations and leg swelling.  Gastrointestinal: Negative for abdominal pain, blood in stool, constipation, diarrhea, nausea and vomiting.  Endocrine: Negative.  Negative  for polydipsia, polyphagia and polyuria.  Genitourinary: Negative for decreased urine volume, difficulty urinating, dysuria, frequency and urgency.  Musculoskeletal: Positive for arthralgias, back pain and myalgias. Negative for gait problem, joint swelling, neck pain and neck stiffness.  Skin: Negative.  Negative for color change and rash.  Allergic/Immunologic: Negative.   Neurological: Negative for dizziness, tremors, seizures, syncope, facial asymmetry, speech difficulty, weakness, light-headedness, numbness and headaches.  Hematological: Negative.   Psychiatric/Behavioral: Negative for confusion, hallucinations, sleep disturbance and suicidal ideas.  All other systems reviewed and are negative.        Observations/Objective: No vital signs or physical exam, this was a telephone or virtual health encounter.  Pt alert and oriented, answers all questions appropriately, and able to speak in full sentences.    Assessment and Plan: Juliaette was seen today for  generalized body aches.  Diagnoses and all orders for this visit:  Myalgia Pt with reported ongoing myalgias for over 6 months. Pt requesting refill on hydrocodone. Pt aware this provider has not prescribed this to her in the past and can not do so over the phone. Pt advised if the pain is that significant, she needs to be seen in an UC or ED. Pt refuses and states she just needs something for the pain. Pt has not started the gabapentin prescribed by Dr. [name] on 04/10/2019. States she is unsure if she wants to start this medication. Reported symptoms consistent with fibromyalgia. Discussed this disease process with the pt in detail and the benefits of gabapentin or Cymbalta for management. Pt declined Cymbalta and states she will think about taking the gabapentin. Pt aware to report any new, worsening, or persistent symptoms. Pt to follow up 4 weeks after initiation of gabapentin.     Follow Up Instructions: Return in about 4 weeks  (around 07/21/2019), or if symptoms worsen or fail to improve, for myalgias.    I discussed the assessment and treatment plan with the patient. The patient was provided an opportunity to ask questions and all were answered. The patient agreed with the plan and demonstrated an understanding of the instructions.   The patient was advised to call back or seek an in-person evaluation if the symptoms worsen or if the condition fails to improve as anticipated.  The above assessment and management plan was discussed with the patient. The patient verbalized understanding of and has agreed to the management plan. Patient is aware to call the clinic if they develop any new symptoms or if symptoms persist or worsen. Patient is aware when to return to the clinic for a follow-up visit. Patient educated on when it is appropriate to go to the emergency department.    I provided 25 minutes of non-face-to-face time during this encounter. The call started at 0945. The call ended at 1010. The other time was used for coordination of care.    Monia Pouch, FNP-C [facility] [address] [place],  [address] [phone] 06/23/2019

## 2019-06-24 ENCOUNTER — Telehealth: Payer: Self-pay | Admitting: Family Medicine

## 2019-06-24 NOTE — Telephone Encounter (Signed)
Please advise 

## 2019-06-25 ENCOUNTER — Other Ambulatory Visit: Payer: Self-pay | Admitting: Family Medicine

## 2019-06-25 DIAGNOSIS — M791 Myalgia, unspecified site: Secondary | ICD-10-CM

## 2019-06-25 MED ORDER — PREDNISONE 20 MG PO TABS: ORAL_TABLET | ORAL | 0 refills | Status: DC

## 2019-06-25 NOTE — Telephone Encounter (Signed)
[facility]

## 2019-06-25 NOTE — Telephone Encounter (Signed)
Patient aware.

## 2019-06-26 ENCOUNTER — Ambulatory Visit: Payer: Medicare Other

## 2019-07-14 ENCOUNTER — Telehealth (HOSPITAL_COMMUNITY): Payer: Self-pay | Admitting: Psychiatry

## 2019-07-14 MED ORDER — SERTRALINE HCL 100 MG PO TABS: 100.0000 mg | ORAL_TABLET | Freq: Two times a day (BID) | ORAL | 0 refills | Status: DC

## 2019-07-14 MED ORDER — LITHIUM CARBONATE ER 300 MG PO TBCR: 300.0000 mg | EXTENDED_RELEASE_TABLET | Freq: Two times a day (BID) | ORAL | 0 refills | Status: DC

## 2019-07-14 NOTE — Telephone Encounter (Signed)
Ok sent!

## 2019-07-14 NOTE — Telephone Encounter (Signed)
Pt needs 25m supply on zoloft and geodon Send to [facility]

## 2019-07-15 ENCOUNTER — Ambulatory Visit (HOSPITAL_COMMUNITY): Payer: Medicare Other | Admitting: Psychiatry

## 2019-07-15 ENCOUNTER — Telehealth (HOSPITAL_COMMUNITY): Payer: Self-pay | Admitting: Psychiatry

## 2019-07-15 NOTE — Telephone Encounter (Signed)
Informed patient.  Nothing Further Needed at this time.

## 2019-07-15 NOTE — Telephone Encounter (Signed)
[facility]- she went to pick it up and it was over $100.  Can you please send it to [facility]- it will only be $20 there.   Thank you

## 2019-07-15 NOTE — Telephone Encounter (Signed)
She can call [phone] and [facility]  This way [facility] will not leave behind [facility]

## 2019-07-21 ENCOUNTER — Telehealth: Payer: Self-pay | Admitting: Family Medicine

## 2019-07-21 NOTE — Telephone Encounter (Signed)
Appointment has been made. No further questions at this time.  

## 2019-07-21 NOTE — Telephone Encounter (Signed)
No problem. I will see her.

## 2019-07-21 NOTE — Telephone Encounter (Signed)
Patient wanted to know if you can see her again. She wants to re-establish care. She is not happy with provider or care now. She states shes been a long time patient of yours. I did tell her you are not talking new patients but I would ask. Please advise.

## 2019-07-22 ENCOUNTER — Ambulatory Visit (INDEPENDENT_AMBULATORY_CARE_PROVIDER_SITE_OTHER): Payer: Medicare Other | Admitting: Physician Assistant

## 2019-07-22 ENCOUNTER — Encounter: Payer: Self-pay | Admitting: Physician Assistant

## 2019-07-22 VITALS — BP 108/66 | HR 100 | Temp 98.0°F | Wt 210.0 lb

## 2019-07-22 DIAGNOSIS — Z720 Tobacco use: Secondary | ICD-10-CM

## 2019-07-22 DIAGNOSIS — M791 Myalgia, unspecified site: Secondary | ICD-10-CM

## 2019-07-22 DIAGNOSIS — J0140 Acute pansinusitis, unspecified: Secondary | ICD-10-CM | POA: Diagnosis not present

## 2019-07-22 DIAGNOSIS — M2550 Pain in unspecified joint: Secondary | ICD-10-CM

## 2019-07-22 DIAGNOSIS — R05 Cough: Secondary | ICD-10-CM | POA: Diagnosis not present

## 2019-07-22 DIAGNOSIS — R053 Chronic cough: Secondary | ICD-10-CM

## 2019-07-22 MED ORDER — AMOXICILLIN-POT CLAVULANATE 875-125 MG PO TABS: 1.0000 | ORAL_TABLET | Freq: Two times a day (BID) | ORAL | 0 refills | Status: DC

## 2019-07-22 NOTE — Progress Notes (Signed)
Patient ID: Dona Tomasso, female   DOB: 08-02-1956, 63 y.o.   MRN: [id number] .Marland KitchenVirtual Visit via Telephone Note  I connected with Flannery Smithart on 07/22/19 at  3:00 PM EST by telephone and verified that I am speaking with the correct person using two identifiers.  Location: Patient: home Provider: clinic   I discussed the limitations, risks, security and privacy concerns of performing an evaluation and management service by telephone and the availability of in person appointments. I also discussed with the patient that there may be a patient responsible charge related to this service. The patient expressed understanding and agreed to proceed.   History of Present Illness: Patient is a 63 year old female who is bipolar who calls into the clinic to discuss ongoing symptoms and to reestablish care.  Patient was previously under my care moved to a primary care provider closer to where she lives.  She would like to switch back to our clinic.  Patient has been having body aches, muscle pains, joint pain, increased fatigue, cough for the last 6 weeks. Symptoms wax and wane. Ibuprofen does help but then symptoms come back. She has no fever, chills, SOB, GI symptoms. Symptoms started gradually. She is 62 year smoker and has an ongoing cough. She denies any headache, runny nose, PND, ST, CP, palpitations, abdominal pain, dysuria, or nausea or vomiting.  She does have some sinus pressure and congestion that is ongoing.   She has never been tested for covid. No medication changes. No known tick bite. No known sick contacts. She has not had antibiotic but did have a burst of steriods and did not help at all.     .. Active Ambulatory Problems    Diagnosis Date Noted  . Hyperlipidemia 07/09/2013  . GERD (gastroesophageal reflux disease) 07/09/2013  . DDD (degenerative disc disease), lumbosacral 07/09/2013  . Bipolar 1 disorder ([place]) 07/09/2013  . Osteoarthritis of left glenohumeral joint 04/30/2014   . Left shoulder pain 03/01/2015  . Bilateral carpal tunnel syndrome 05/03/2015  . DDD (degenerative disc disease), cervical 05/03/2015  . Lumbar disc herniation with radiculopathy 05/03/2015  . Chronic SI joint pain 09/21/2015  . Leukocytosis 01/14/2016  . Thyroid activity decreased 01/14/2016  . Callus 05/08/2016  . Tobacco abuse 10/12/2016  . Colon polyp 01/04/2018  . BMI 35.0-35.9,adult 12/19/2018  . Myalgia 06/23/2019   Resolved Ambulatory Problems    Diagnosis Date Noted  . Low back pain with sciatica 07/09/2013  . Impacted cerumen of left ear 04/23/2017   Past Medical History:  Diagnosis Date  . Degenerative disc disease, lumbar    Reviewed med, allergy, problem list.    Observations/Objective: No acute distress.  Normal mood.  Hoarse sounding voice with intermittent productive cough.  No labored breathing.   .. Today's Vitals   07/22/19 1501  BP: 108/66  Pulse: 100  Temp: 98 F (36.7 C)  Weight: 210 lb (95.3 kg)   Body mass index is 34.95 kg/m.    Assessment and Plan: Marland KitchenMarland KitchenSheryll was seen today for generalized body aches.  Diagnoses and all orders for this visit:  Myalgia -     Cancel: Epstein-Barr virus VCA antibody panel -     Cancel: CMV abs, IgG+IgM (cytomegalovirus) -     Cancel: B. burgdorfi antibodies -     Cancel: Rocky mtn spotted fvr abs pnl(IgG+IgM) -     Cancel: SAR CoV2 Serology (COVID 19)AB(IGG)IA -     Cancel: TSH -     Cancel: CBC w/Diff -  Cancel: COMPLETE METABOLIC PANEL WITH GFR -     Cancel: Rheumatoid factor -     Cancel: Cyclic citrul peptide antibody, IgG  (Rheumatoid Arthritis) -     Cancel: Lithium level -     TSH -     SAR CoV2 Serology (COVID 19)AB(IGG)IA -     Rocky mtn spotted fvr abs pnl(IgG+IgM) -     Rheumatoid Factor -     Lithium level -     Epstein-Barr virus VCA antibody panel -     CMV abs, IgG+IgM (cytomegalovirus) -     B. burgdorfi antibodies -     CBC with Differential/Platelet -     CYCLIC  CITRUL PEPTIDE ANTIBODY, IGG/IGA -     Comprehensive Metabolic Panel (CMET)  Arthralgia, unspecified joint -     Cancel: Epstein-Barr virus VCA antibody panel -     Cancel: CMV abs, IgG+IgM (cytomegalovirus) -     Cancel: B. burgdorfi antibodies -     Cancel: Rocky mtn spotted fvr abs pnl(IgG+IgM) -     Cancel: SAR CoV2 Serology (COVID 19)AB(IGG)IA -     Cancel: TSH -     Cancel: CBC w/Diff -     Cancel: COMPLETE METABOLIC PANEL WITH GFR -     Cancel: Rheumatoid factor -     Cancel: Cyclic citrul peptide antibody, IgG  (Rheumatoid Arthritis) -     Cancel: Lithium level -     TSH -     SAR CoV2 Serology (COVID 19)AB(IGG)IA -     Rocky mtn spotted fvr abs pnl(IgG+IgM) -     Rheumatoid Factor -     Lithium level -     Epstein-Barr virus VCA antibody panel -     CMV abs, IgG+IgM (cytomegalovirus) -     B. burgdorfi antibodies -     CBC with Differential/Platelet -     CYCLIC CITRUL PEPTIDE ANTIBODY, IGG/IGA -     Comprehensive Metabolic Panel (CMET)  Subacute pansinusitis -     amoxicillin-clavulanate (AUGMENTIN) 875-125 MG tablet; Take 1 tablet by mouth 2 (two) times daily. -     TSH -     SAR CoV2 Serology (COVID 19)AB(IGG)IA -     Rocky mtn spotted fvr abs pnl(IgG+IgM) -     Rheumatoid Factor -     Lithium level -     Epstein-Barr virus VCA antibody panel -     CMV abs, IgG+IgM (cytomegalovirus) -     B. burgdorfi antibodies -     CBC with Differential/Platelet -     CYCLIC CITRUL PEPTIDE ANTIBODY, IGG/IGA -     Comprehensive Metabolic Panel (CMET)  Chronic cough   Sent labs to [place] where she lives.  Unclear etiology. Ongoing for 6 months. Labs to rule out EBV/CMV. I do think patient has COPD and not on any treatment for this. If labs negative then I would consider spirometry in office and CXR.  She is having ongoing sinus symptoms as well. She could have subacute sinusitis. Treated with augmentin.  Also wonder if she could have had covid infection and was not tested  and some of this is sequela. Will test for antibodies.  On lithium. Will check lithium level.  Follow up after labs.    Follow Up Instructions:    I discussed the assessment and treatment plan with the patient. The patient was provided an opportunity to ask questions and all were answered. The patient agreed with the plan and demonstrated  an understanding of the instructions.   The patient was advised to call back or seek an in-person evaluation if the symptoms worsen or if the condition fails to improve as anticipated.  I provided 20 minutes of non-face-to-face time during this encounter.   Iran Planas, PA-C

## 2019-07-22 NOTE — Progress Notes (Deleted)
Body aches x 6 weeks, increased fatigue, sleeping more than usual. Pt does have a non-productive cough but attributes it to her hx of smoking. Takes ibuprofen with minimal relief. Sx wax and wane.   Pt denies: headache, sinus pain/pressue, ear fullness, runny nose, post nasal drainage, ST, CP, palps, ShOB, abd pain, dysuria, and N/V/C/D

## 2019-07-23 ENCOUNTER — Telehealth: Payer: Self-pay | Admitting: Neurology

## 2019-07-23 ENCOUNTER — Other Ambulatory Visit: Payer: Medicare Other

## 2019-07-23 ENCOUNTER — Other Ambulatory Visit: Payer: Self-pay

## 2019-07-23 DIAGNOSIS — R05 Cough: Secondary | ICD-10-CM | POA: Insufficient documentation

## 2019-07-23 DIAGNOSIS — M2550 Pain in unspecified joint: Secondary | ICD-10-CM | POA: Insufficient documentation

## 2019-07-23 DIAGNOSIS — R053 Chronic cough: Secondary | ICD-10-CM | POA: Insufficient documentation

## 2019-07-23 NOTE — Telephone Encounter (Signed)
Ok pt call follow up when done with antibiotic and we can reassess her symptoms.

## 2019-07-23 NOTE — Addendum Note (Signed)
Addended byAnnamaria Helling on: 07/23/2019 03:29 PM   Modules accepted: Orders

## 2019-07-23 NOTE — Telephone Encounter (Signed)
Can we clear up that we are checking for past infection not current infection with lab in [place]. I want to know IF she has had it in the past.

## 2019-07-23 NOTE — Telephone Encounter (Signed)
I believe order was placed wrong, I replaced order and will contact their facility.

## 2019-07-23 NOTE — Telephone Encounter (Signed)
I called lab to confirm and they state they don't even draw antibody testing and can not draw her other labs because of being connected to Covid testing. She also told them she had SOB, so they will not draw her labs at all.

## 2019-07-23 NOTE — Telephone Encounter (Signed)
Patient left vm stating because we ordered Covid testing with her labs that [facility] facility in [place] refused to drawn any labs on patient. They did give her information to get them drawn in [place], but patient wanted you to know why they weren't done.  She also did start antibiotic and wanted you to know. FYI.

## 2019-07-24 NOTE — Telephone Encounter (Signed)
Patient made aware. She will be done on Christmas Eve and will touch base with [place] after Christmas to let us know how she is doing.

## 2019-07-25 ENCOUNTER — Encounter (HOSPITAL_COMMUNITY): Payer: Self-pay | Admitting: Psychiatry

## 2019-07-25 ENCOUNTER — Ambulatory Visit (INDEPENDENT_AMBULATORY_CARE_PROVIDER_SITE_OTHER): Payer: Medicare Other | Admitting: Psychiatry

## 2019-07-25 DIAGNOSIS — F5102 Adjustment insomnia: Secondary | ICD-10-CM

## 2019-07-25 DIAGNOSIS — F315 Bipolar disorder, current episode depressed, severe, with psychotic features: Secondary | ICD-10-CM | POA: Diagnosis not present

## 2019-07-25 DIAGNOSIS — F419 Anxiety disorder, unspecified: Secondary | ICD-10-CM

## 2019-07-25 DIAGNOSIS — G259 Extrapyramidal and movement disorder, unspecified: Secondary | ICD-10-CM

## 2019-07-25 DIAGNOSIS — G8929 Other chronic pain: Secondary | ICD-10-CM

## 2019-07-25 NOTE — Progress Notes (Signed)
Patient ID: Amber Rice, female   DOB: 07-04-1956, 63 y.o.   MRN: [id number] Adrian [id number] 63 y.o.  07/25/2019  Chief Complaint: Follow up bipolar disorder. Med review    I connected with Amber Rice on 07/25/19 at 12:15 PM EST by telephone and verified that I am speaking with the correct person using two identifiers.   I discussed the limitations, risks, security and privacy concerns of performing an evaluation and management service by telephone and the availability of in person appointments. I also discussed with the patient that there may be a patient responsible charge related to this service. The patient expressed understanding and agreed to proceed.  History of Present Illness:   Diagnosed with bipolar disorder depressed. Anxiety disorder NOS. Insomnia  Doing fair regarding mood, had back pain and follows up with Dr. [name] or [name].  Doing fair regarding depression. Talks to sister as support Labs not done, orders were placed says she will get done in a month Feels depression is in control   no paranoia  Duration of bipolar : 12 years  Aggravating factor back condition Depression scale 7/10 . 10 being no depression  Modifying factors; walking when she can . Reading books   Past Medical History:  Diagnosis Date  . Degenerative disc disease, lumbar    diagnosed in 2005  . Hyperlipidemia   . Impacted cerumen of left ear 04/23/2017   Family History  Problem Relation Age of Onset  . Cancer Mother   . Heart attack Father   . Hyperlipidemia Father   . Cancer Maternal Grandmother   . Cancer Paternal Grandmother   . Bipolar disorder Paternal Uncle        Committed suicide in his 57's  . Dementia Paternal Uncle   . Depression Cousin   . Schizophrenia Cousin   . Multiple sclerosis Sister   . Breast cancer Cousin   . Alcohol abuse Neg Hx   . Anxiety disorder Neg Hx     Outpatient Encounter  Medications as of 07/25/2019  Medication Sig  . amoxicillin-clavulanate (AUGMENTIN) 875-125 MG tablet Take 1 tablet by mouth 2 (two) times daily.  . diclofenac sodium (VOLTAREN) 1 % GEL Apply 4 g topically 4 (four) times daily. To affected joint. (Patient not taking: Reported on 07/22/2019)  . lithium carbonate (LITHOBID) 300 MG CR tablet Take 1 tablet (300 mg total) by mouth 2 (two) times daily.  . sertraline (ZOLOFT) 100 MG tablet Take 1 tablet (100 mg total) by mouth 2 (two) times daily. Cancel prescription sent [facility]   No facility-administered encounter medications on file as of 07/25/2019.    No results found for this or any previous visit (from the past 2160 hour(s)).  There were no vitals taken for this visit.   Review of Systems  Cardiovascular: Negative for chest pain.  Musculoskeletal: Positive for back pain.  Psychiatric/Behavioral: Negative for substance abuse and suicidal ideas.    Mental Status Examination  Appearance:  Alert: Yes Attention: fair  Cooperative: Yes Eye Contact:  Speech: normal Psychomotor Activity: Decreased Memory/Concentration: adequate Oriented: person, place, time/date and situation Mood: fair Affect: congruent Thought Processes and Associations: Coherent no paranoia as of now [employer] of Knowledge: Fair Thought Content: Suicidal ideation and Homicidal ideation were denied Insight: Fair Judgement: Fair  Diagnosis: Bipolar disorder type I manic per history for psychotic features. Insomnia NOS. Adjustment disorder with anxiety . EPS Treatment Plan:   1. Bipolar: depression  Stable on  meds, continue zoloft and lithium. Labs within a month, orders have been in 2. Depression:better.continue zoloft. Not paranoid 3. Insomnia:irregular sleep hygiene reviewed. Back condition can effect sleep. Is on meds  Fu 3-14m.  I discussed the assessment and treatment plan with the patient. The patient was provided an opportunity to ask questions and all were  answered. The patient agreed with the plan and demonstrated an understanding of the instructions.   The patient was advised to call back or seek an in-person evaluation if the symptoms worsen or if the condition fails to improve as anticipated.  I provided 15  minutes of non-face-to-face time during this encounter.  [name], MD

## 2019-07-28 NOTE — Telephone Encounter (Signed)
Patient called and left vm stating she has appt in [place] and will have her labs drawn next Monday downstairs. Labs reordered for [facility].

## 2019-07-28 NOTE — Addendum Note (Signed)
Addended byAnnamaria Helling on: 07/28/2019 03:27 PM   Modules accepted: Orders

## 2019-08-04 ENCOUNTER — Encounter: Payer: Self-pay | Admitting: Physician Assistant

## 2019-08-04 DIAGNOSIS — M791 Myalgia, unspecified site: Secondary | ICD-10-CM | POA: Diagnosis not present

## 2019-08-04 DIAGNOSIS — J0140 Acute pansinusitis, unspecified: Secondary | ICD-10-CM | POA: Diagnosis not present

## 2019-08-04 DIAGNOSIS — R05 Cough: Secondary | ICD-10-CM | POA: Diagnosis not present

## 2019-08-04 DIAGNOSIS — M2550 Pain in unspecified joint: Secondary | ICD-10-CM | POA: Diagnosis not present

## 2019-08-04 DIAGNOSIS — Z720 Tobacco use: Secondary | ICD-10-CM | POA: Diagnosis not present

## 2019-08-05 LAB — SAR COV2 SEROLOGY (COVID19)AB(IGG),IA: SARS CoV2 AB IGG: NEGATIVE

## 2019-08-07 MED ORDER — LEVOTHYROXINE SODIUM 50 MCG PO TABS: 50.0000 ug | ORAL_TABLET | Freq: Every day | ORAL | 1 refills | Status: DC

## 2019-08-07 NOTE — Progress Notes (Signed)
Sent!

## 2019-08-07 NOTE — Progress Notes (Signed)
Rosalita,   No covid antibodies. You have not had covid.  Negative for lymes.  Hemoglobin a little low. Are you having any blood in stool?  Kidney and liver look good.  No recent mono but hx of mono.  Negative for Rheumatoid arthritis.  Lithium level low not in optimal range.  Your TSH isup showing more hypothyroid state and low dose of thyroid medication could help make you feel a little better. Would you like to try and recheck labs in 4-6 weeks.

## 2019-08-07 NOTE — Addendum Note (Signed)
Addended by: Donella Stade on: 08/07/2019 12:08 PM   Modules accepted: Orders

## 2019-08-12 LAB — CBC WITH DIFFERENTIAL/PLATELET
Absolute Monocytes: 943 cells/uL (ref 200–950)
Basophils Absolute: 105 cells/uL (ref 0–200)
Basophils Relative: 0.8 %
Eosinophils Absolute: 472 cells/uL (ref 15–500)
Eosinophils Relative: 3.6 %
HCT: 33.8 % — ABNORMAL LOW (ref 35.0–45.0)
Hemoglobin: 11 g/dL — ABNORMAL LOW (ref 11.7–15.5)
Lymphs Abs: 2476 cells/uL (ref 850–3900)
MCH: 28.7 pg (ref 27.0–33.0)
MCHC: 32.5 g/dL (ref 32.0–36.0)
MCV: 88.3 fL (ref 80.0–100.0)
MPV: 9.3 fL (ref 7.5–12.5)
Monocytes Relative: 7.2 %
Neutro Abs: 9105 cells/uL — ABNORMAL HIGH (ref 1500–7800)
Neutrophils Relative %: 69.5 %
Platelets: 664 10*3/uL — ABNORMAL HIGH (ref 140–400)
RBC: 3.83 10*6/uL (ref 3.80–5.10)
RDW: 14 % (ref 11.0–15.0)
Total Lymphocyte: 18.9 %
WBC: 13.1 10*3/uL — ABNORMAL HIGH (ref 3.8–10.8)

## 2019-08-12 LAB — COMPLETE METABOLIC PANEL WITH GFR
AG Ratio: 1.3 (calc) (ref 1.0–2.5)
ALT: 10 U/L (ref 6–29)
AST: 11 U/L (ref 10–35)
Albumin: 3.8 g/dL (ref 3.6–5.1)
Alkaline phosphatase (APISO): 102 U/L (ref 37–153)
BUN: 17 mg/dL (ref 7–25)
CO2: 23 mmol/L (ref 20–32)
Calcium: 9.7 mg/dL (ref 8.6–10.4)
Chloride: 105 mmol/L (ref 98–110)
Creat: 0.91 mg/dL (ref 0.50–0.99)
GFR, Est African American: 78 mL/min/{1.73_m2} (ref >=60)
GFR, Est Non African American: 68 mL/min/{1.73_m2} (ref >=60)
Globulin: 3 g/dL (calc) (ref 1.9–3.7)
Glucose, Bld: 87 mg/dL (ref 65–99)
Potassium: 4.9 mmol/L (ref 3.5–5.3)
Sodium: 138 mmol/L (ref 135–146)
Total Bilirubin: 0.2 mg/dL (ref 0.2–1.2)
Total Protein: 6.8 g/dL (ref 6.1–8.1)

## 2019-08-12 LAB — ROCKY MTN SPOTTED FVR ABS PNL(IGG+IGM)
RMSF IgG: NOT DETECTED
RMSF IgM: NOT DETECTED

## 2019-08-12 LAB — EPSTEIN-BARR VIRUS VCA ANTIBODY PANEL
EBV NA IgG: >600 U/mL — ABNORMAL HIGH
EBV VCA IgG: 364 U/mL — ABNORMAL HIGH
EBV VCA IgM: <36 U/mL

## 2019-08-12 LAB — LITHIUM LEVEL: Lithium Lvl: 0.5 mmol/L — ABNORMAL LOW (ref 0.6–1.2)

## 2019-08-12 LAB — TSH: TSH: 5.9 mIU/L — ABNORMAL HIGH (ref 0.40–4.50)

## 2019-08-12 LAB — RHEUMATOID FACTOR: Rheumatoid fact SerPl-aCnc: <14 IU/mL (ref <14)

## 2019-08-12 LAB — CMV ABS, IGG+IGM (CYTOMEGALOVIRUS)
CMV IgM: <30 AU/mL
Cytomegalovirus Ab-IgG: >10 U/mL — ABNORMAL HIGH

## 2019-08-12 LAB — B. BURGDORFI ANTIBODIES: B burgdorferi Ab IgG+IgM: <0.9 index

## 2019-08-14 ENCOUNTER — Ambulatory Visit (INDEPENDENT_AMBULATORY_CARE_PROVIDER_SITE_OTHER): Payer: Medicare Other | Admitting: Medical-Surgical

## 2019-08-14 ENCOUNTER — Encounter: Payer: Self-pay | Admitting: Medical-Surgical

## 2019-08-14 DIAGNOSIS — K140 Glossitis: Secondary | ICD-10-CM | POA: Diagnosis not present

## 2019-08-14 DIAGNOSIS — R682 Dry mouth, unspecified: Secondary | ICD-10-CM

## 2019-08-14 DIAGNOSIS — H04129 Dry eye syndrome of unspecified lacrimal gland: Secondary | ICD-10-CM

## 2019-08-14 MED ORDER — NON FORMULARY: Freq: Four times a day (QID) | Status: DC | PRN

## 2019-08-14 NOTE — Assessment & Plan Note (Addendum)
Checking folate, B12, iron/ferritin/TIBC, and Sjogren's labs.  Dukes Magic mouthwash 4 times daily as needed for discomfort.  Refer to ENT.  Discussed smoking and associated increased risk of oral cancer.

## 2019-08-14 NOTE — Progress Notes (Addendum)
Virtual Visit via Video Note  I connected with Amber Rice on 08/14/19 at 10:10 AM EST by a video enabled telemedicine application and verified that I am speaking with the correct person using two identifiers.   I discussed the limitations of evaluation and management by telemedicine and the availability of in person appointments. The patient expressed understanding and agreed to proceed.  Subjective:    CC: Painful, red tongue  HPI:  Pleasant 64 year old female presenting with complaints of tongue redness with deep grooves and swollen taste buds.  Redness and grooves located on dorsal surface of the tongue at the front half.  Taste buds are red and swollen but patient reports they occasionally turn white.  Pain and burning with any contact of the tongue especially cold foods and toothpaste but is not exacerbated by smoking.  Is able to separate grooves on the tongue and reports there is white discoloration in the base of the grooves.  Has had the symptoms for approximately a year with periods of improvement and exacerbation, but no resolution.  Has tried Magic mouthwash which helped somewhat with the pain.  Recently took a course of amoxicillin for a separate issue whichdid not affect her tongue pain/redness.  Currently smokes approximately 1 pack/day.  Denies any recent difficulties with reflux.  No special dietary practices, reports she eats a wide variety of foods.  No new medications or foods tried.  No other GI symptoms, fever, or chills.  Has been taking lithium for 11 years with no dose change.  Reports having extremely dry mouth as well as dry eyes.   Past medical history, Surgical history, Family history not pertinant except as noted below, Social history, Allergies, and medications have been entered into the medical record, reviewed, and corrections made.   Review of Systems: No fevers, chills, night sweats, weight loss, chest pain, or shortness of breath.   Objective:    General:  Speaking clearly in complete sentences without any shortness of breath.  Alert and oriented x3.  Normal judgment. No apparent acute distress.  Recent labs reviewed.  Hemoglobin 11.0 on 08/04/2019 down from 15.3 in May 2020.   Impression and Recommendations:    Glossitis Checking folate, B12, iron/ferritin/TIBC, and Sjogren's labs.  Dukes Magic mouthwash 4 times daily as needed for discomfort.  Refer to ENT.  Discussed smoking and associated increased risk of oral cancer.  Dry mouth/eyes Checking Sjogren's labs.  I discussed the assessment and treatment plan with the patient. The patient was provided an opportunity to ask questions and all were answered. The patient agreed with the plan and demonstrated an understanding of the instructions.   The patient was advised to call back or seek an in-person evaluation if the symptoms worsen or if the condition fails to improve as anticipated.  Return if symptoms worsen or fail to improve.  35 minutes of non-face-to-face time was provided during this encounter.  Clearnce Sorrel, DNP, APRN, FNP-BC [facility]

## 2019-08-21 ENCOUNTER — Other Ambulatory Visit: Payer: Self-pay

## 2019-08-21 ENCOUNTER — Ambulatory Visit (INDEPENDENT_AMBULATORY_CARE_PROVIDER_SITE_OTHER): Payer: Medicare Other | Admitting: Sports Medicine

## 2019-08-21 DIAGNOSIS — K140 Glossitis: Secondary | ICD-10-CM | POA: Diagnosis not present

## 2019-08-21 DIAGNOSIS — M19071 Primary osteoarthritis, right ankle and foot: Secondary | ICD-10-CM | POA: Diagnosis not present

## 2019-08-21 MED ORDER — HYDROCODONE-ACETAMINOPHEN 5-325 MG PO TABS: 1.0000 | ORAL_TABLET | Freq: Three times a day (TID) | ORAL | 0 refills | Status: DC | PRN

## 2019-08-21 NOTE — Assessment & Plan Note (Signed)
Amber Rice is a pleasant 64 year old female with right ankle pain, she does have a history of arthritis, injected by podiatrist back in the summertime. She did well until recently.  Now having recurrence of pain at the tibiotalar joint. I did note mild osteoarthritis on her x-rays though they were read as negative. Tibiotalar joint injection with guidance today, referral for custom molded orthotics, refilling some pain medication, return to see me in 1 month.

## 2019-08-21 NOTE — Progress Notes (Signed)
    Procedures performed today:    Procedure: Real-time Ultrasound Guided injection of the right ankle joint Device: Samsung HS60  Verbal informed consent obtained.  Time-out conducted.  Noted no overlying erythema, induration, or other signs of local infection.  Skin prepped in a sterile fashion.  Local anesthesia: Topical Ethyl chloride.  With sterile technique and under real time ultrasound guidance: 1 cc Kenalog 40, 1 cc lidocaine, 1 cc bupivacaine injected easily Completed without difficulty  Pain immediately resolved suggesting accurate placement of the medication.  Advised to call if fevers/chills, erythema, induration, drainage, or persistent bleeding.  Images permanently stored and available for review in the ultrasound unit.  Impression: Technically successful ultrasound guided injection.  Independent interpretation of tests performed by another provider:   None.  Impression and Recommendations:    Primary osteoarthritis of right ankle Amber Rice is a pleasant 64 year old female with right ankle pain, she does have a history of arthritis, injected by podiatrist back in the summertime. She did well until recently.  Now having recurrence of pain at the tibiotalar joint. I did note mild osteoarthritis on her x-rays though they were read as negative. Tibiotalar joint injection with guidance today, referral for custom molded orthotics, refilling some pain medication, return to see me in 1 month.    ___________________________________________ Gwen Her. Dianah Field, M.D., ABFM., CAQSM. Primary Care and [place] Instructor of [employer] [facility] of [employer]

## 2019-08-22 LAB — SJOGRENS SYNDROME-B EXTRACTABLE NUCLEAR ANTIBODY: SSB (La) (ENA) Antibody, IgG: <1 AI

## 2019-08-22 LAB — B12 AND FOLATE PANEL
Folate: 6.8 ng/mL
Vitamin B-12: 390 pg/mL (ref 200–1100)

## 2019-08-22 LAB — SJOGRENS SYNDROME-A EXTRACTABLE NUCLEAR ANTIBODY: SSA (Ro) (ENA) Antibody, IgG: <1 AI

## 2019-08-31 ENCOUNTER — Other Ambulatory Visit: Payer: Self-pay | Admitting: Physician Assistant

## 2019-09-11 ENCOUNTER — Other Ambulatory Visit: Payer: Self-pay

## 2019-09-11 DIAGNOSIS — M19071 Primary osteoarthritis, right ankle and foot: Secondary | ICD-10-CM

## 2019-09-12 MED ORDER — HYDROCODONE-ACETAMINOPHEN 5-325 MG PO TABS: 1.0000 | ORAL_TABLET | Freq: Three times a day (TID) | ORAL | 0 refills | Status: DC | PRN

## 2019-09-12 NOTE — Telephone Encounter (Signed)
Saw her last for osteoarthritis.

## 2019-09-29 ENCOUNTER — Other Ambulatory Visit: Payer: Self-pay | Admitting: Physician Assistant

## 2019-09-29 DIAGNOSIS — E038 Other specified hypothyroidism: Secondary | ICD-10-CM

## 2019-10-04 ENCOUNTER — Other Ambulatory Visit: Payer: Self-pay

## 2019-10-04 DIAGNOSIS — M19071 Primary osteoarthritis, right ankle and foot: Secondary | ICD-10-CM

## 2019-10-06 MED ORDER — HYDROCODONE-ACETAMINOPHEN 5-325 MG PO TABS: 1.0000 | ORAL_TABLET | Freq: Three times a day (TID) | ORAL | 0 refills | Status: DC | PRN

## 2019-10-06 NOTE — Telephone Encounter (Signed)
Last seen 1/14 for OA foot. Amber Rice requesting you to review refill.

## 2019-10-07 ENCOUNTER — Other Ambulatory Visit (HOSPITAL_COMMUNITY): Payer: Self-pay

## 2019-10-07 ENCOUNTER — Other Ambulatory Visit: Payer: Self-pay | Admitting: Sports Medicine

## 2019-10-07 DIAGNOSIS — M19071 Primary osteoarthritis, right ankle and foot: Secondary | ICD-10-CM

## 2019-10-07 MED ORDER — SERTRALINE HCL 100 MG PO TABS: 100.0000 mg | ORAL_TABLET | Freq: Two times a day (BID) | ORAL | 0 refills | Status: DC

## 2019-10-07 MED ORDER — LITHIUM CARBONATE ER 300 MG PO TBCR: 300.0000 mg | EXTENDED_RELEASE_TABLET | Freq: Two times a day (BID) | ORAL | 0 refills | Status: DC

## 2019-10-07 MED ORDER — HYDROCODONE-ACETAMINOPHEN 5-325 MG PO TABS: 1.0000 | ORAL_TABLET | Freq: Three times a day (TID) | ORAL | 0 refills | Status: DC | PRN

## 2019-10-14 ENCOUNTER — Telehealth: Payer: Self-pay | Admitting: Physician Assistant

## 2019-10-14 DIAGNOSIS — M791 Myalgia, unspecified site: Secondary | ICD-10-CM

## 2019-10-14 DIAGNOSIS — E038 Other specified hypothyroidism: Secondary | ICD-10-CM

## 2019-10-14 DIAGNOSIS — R05 Cough: Secondary | ICD-10-CM

## 2019-10-14 DIAGNOSIS — E782 Mixed hyperlipidemia: Secondary | ICD-10-CM

## 2019-10-14 DIAGNOSIS — R053 Chronic cough: Secondary | ICD-10-CM

## 2019-10-14 DIAGNOSIS — Z5181 Encounter for therapeutic drug level monitoring: Secondary | ICD-10-CM

## 2019-10-14 NOTE — Telephone Encounter (Signed)
Patient left vm stating she is scheduled to have labs drawn 11/05/2019 downstairs and wants orders to have B12, Hemoglobin, and thyroid rechecked. It looks like B12 was just normal in January. Please look at labs and advise what she needs done.   Lab Results  Component Value Date   VITAMINB12 390 08/21/2019   Lab Results  Component Value Date   WBC 13.1 (H) 08/04/2019   HGB 11.0 (L) 08/04/2019   HCT 33.8 (L) 08/04/2019   MCV 88.3 08/04/2019   PLT 664 (H) 08/04/2019   Lab Results  Component Value Date   TSH 5.90 (H) 08/04/2019

## 2019-10-17 NOTE — Telephone Encounter (Signed)
Labs ordered. Tried to make patient aware of instructions. No answer, no VM.

## 2019-10-17 NOTE — Addendum Note (Signed)
Addended byAnnamaria Helling on: 10/17/2019 02:18 PM   Modules accepted: Orders

## 2019-10-17 NOTE — Telephone Encounter (Signed)
Patient called back and was made aware orders placed. She is taking B12 supplement since January.

## 2019-10-17 NOTE — Telephone Encounter (Signed)
Ok for lipid, cmp, TSH, CBC she does not need b12 just drawn. She could benefit from 1029mcg of b12 daily.

## 2019-10-22 ENCOUNTER — Ambulatory Visit (INDEPENDENT_AMBULATORY_CARE_PROVIDER_SITE_OTHER): Payer: Medicare Other | Admitting: Physician Assistant

## 2019-10-22 ENCOUNTER — Other Ambulatory Visit: Payer: Self-pay

## 2019-10-22 ENCOUNTER — Encounter: Payer: Self-pay | Admitting: Sports Medicine

## 2019-10-22 ENCOUNTER — Ambulatory Visit (INDEPENDENT_AMBULATORY_CARE_PROVIDER_SITE_OTHER): Payer: Medicare Other | Admitting: Sports Medicine

## 2019-10-22 VITALS — BP 174/89 | HR 91 | Ht 65.0 in | Wt 215.0 lb

## 2019-10-22 DIAGNOSIS — R030 Elevated blood-pressure reading, without diagnosis of hypertension: Secondary | ICD-10-CM

## 2019-10-22 DIAGNOSIS — R600 Localized edema: Secondary | ICD-10-CM

## 2019-10-22 DIAGNOSIS — M791 Myalgia, unspecified site: Secondary | ICD-10-CM | POA: Diagnosis not present

## 2019-10-22 DIAGNOSIS — R05 Cough: Secondary | ICD-10-CM | POA: Diagnosis not present

## 2019-10-22 DIAGNOSIS — M19071 Primary osteoarthritis, right ankle and foot: Secondary | ICD-10-CM

## 2019-10-22 DIAGNOSIS — R809 Proteinuria, unspecified: Secondary | ICD-10-CM

## 2019-10-22 DIAGNOSIS — Z5181 Encounter for therapeutic drug level monitoring: Secondary | ICD-10-CM | POA: Diagnosis not present

## 2019-10-22 DIAGNOSIS — E782 Mixed hyperlipidemia: Secondary | ICD-10-CM | POA: Diagnosis not present

## 2019-10-22 DIAGNOSIS — E038 Other specified hypothyroidism: Secondary | ICD-10-CM | POA: Diagnosis not present

## 2019-10-22 LAB — POCT UA - MICROALBUMIN
Creatinine, POC: 50 mg/dL
Microalbumin Ur, POC: 30 mg/L

## 2019-10-22 MED ORDER — FUROSEMIDE 20 MG PO TABS: 20.0000 mg | ORAL_TABLET | Freq: Every day | ORAL | 0 refills | Status: DC

## 2019-10-22 NOTE — Progress Notes (Signed)
Subjective:    Patient ID: Amber Rice, female    DOB: 1955/10/19, 64 y.o.   MRN: [id number]  HPI  Pt is a 64 yo female who presents to the clinic with bilateral lower extremity edema for 3 days. She thought it was her OA in right ankle but woke up this morning and both ankles are swollen. Right is greater than left. No SoB, ankle pain, redness, swelling or injury. Her BP at home is 110/70. Denies any diet changes, medication changes. TSH was elevated in December. Not tried anything to make better.   .. Active Ambulatory Problems    Diagnosis Date Noted  . Hyperlipidemia 07/09/2013  . GERD (gastroesophageal reflux disease) 07/09/2013  . DDD (degenerative disc disease), lumbosacral 07/09/2013  . Bipolar 1 disorder ([place]) 07/09/2013  . Osteoarthritis of left glenohumeral joint 04/30/2014  . Left shoulder pain 03/01/2015  . Bilateral carpal tunnel syndrome 05/03/2015  . DDD (degenerative disc disease), cervical 05/03/2015  . Lumbar disc herniation with radiculopathy 05/03/2015  . Chronic SI joint pain 09/21/2015  . Leukocytosis 01/14/2016  . Thyroid activity decreased 01/14/2016  . Callus 05/08/2016  . Tobacco abuse 10/12/2016  . Colon polyp 01/04/2018  . BMI 35.0-35.9,adult 12/19/2018  . Myalgia 06/23/2019  . Chronic cough 07/23/2019  . Arthralgia 07/23/2019  . Glossitis 08/14/2019  . Primary osteoarthritis of right ankle 08/21/2019  . Bilateral lower extremity edema 10/22/2019  . Elevated blood pressure reading 10/24/2019  . Proteinuria 10/24/2019   Resolved Ambulatory Problems    Diagnosis Date Noted  . Low back pain with sciatica 07/09/2013  . Impacted cerumen of left ear 04/23/2017   Past Medical History:  Diagnosis Date  . Degenerative disc disease, lumbar         Review of Systems See HPI.     Objective:   Physical Exam Vitals reviewed.  Constitutional:      Appearance: She is obese.  HENT:     Head: Normocephalic.  Cardiovascular:     Rate and  Rhythm: Normal rate and regular rhythm.     Pulses: Normal pulses.     Heart sounds: No murmur.  Pulmonary:     Effort: Pulmonary effort is normal.     Breath sounds: Normal breath sounds.  Abdominal:     Tenderness: There is no right CVA tenderness or left CVA tenderness.  Musculoskeletal:     Right lower leg: Edema present.     Left lower leg: Edema present.     Comments: Right greater than left non-pitting edema.  Superficial veins over swelling.  No tenderness to palpation.  No redness or warmth.  Negative homans sign.   Neurological:     General: No focal deficit present.     Mental Status: She is alert and oriented to person, place, and time.  Psychiatric:        Mood and Affect: Mood normal.           Assessment & Plan:  Marland KitchenMarland KitchenBernadean was seen today for edema.  Diagnoses and all orders for this visit:  Bilateral lower extremity edema -     furosemide (LASIX) 20 MG tablet; Take 1 tablet (20 mg total) by mouth daily. -     POCT UA - Microalbumin  Proteinuria, unspecified type -     Urine Culture  Elevated blood pressure reading   DVT less likely. Negative homans sign, no redness, warmth, tenderness and bilateral.  She does have some superficial veins and suspect some venous insuffiencey. Wear compression  stockings and keep feet elevated.  Does appear she is spilling some protein. Could be some nephrotic syndrome. Kidney function is great. Will culture to confirm no infection.  Will check TSH, CMP, CBC. BP elevated no hx of HTN. Recheck in 2 weeks. Bring home log.  Start lasix daily and recheck in 2 weeks.

## 2019-10-22 NOTE — Assessment & Plan Note (Signed)
Amber Rice returns, we did a right tibiotalar joint injection back in January, she was doing extremely well until recently, within 48 hours she has developed bilateral lower extremity edema, right slightly worse than left. On exam her ankle is nontender, but significantly swollen, right worse than left. Negative bilateral Homans' sign. She has good motion in her ankle itself, I do not think the problem is orthopedic here, because of her bilateral relatively symmetric lower extremity edema I think this is more medical, I do think she needs to see her PCP sometime today, I have placed her on Iran Planas, PA-C schedule. Return to see me for orthopedic processes as needed.

## 2019-10-22 NOTE — Patient Instructions (Addendum)
Take lasix 20mg  once a day for the next 14 days. Then follow up in 2 week.    Chronic Venous Insufficiency Chronic venous insufficiency is a condition where the leg veins cannot effectively pump blood from the legs to the heart. This happens when the vein walls are either stretched, weakened, or damaged, or when the valves inside the vein are damaged. With the right treatment, you should be able to continue with an active life. This condition is also called venous stasis. What are the causes? Common causes of this condition include:  High blood pressure inside the veins (venous hypertension).  Sitting or standing too long, causing increased blood pressure in the leg veins.  A blood clot that blocks blood flow in a vein (deep vein thrombosis, DVT).  Inflammation of a vein (phlebitis) that causes a blood clot to form.  Tumors in the pelvis that cause blood to back up. What increases the risk? The following factors may make you more likely to develop this condition:  Having a family history of this condition.  Obesity.  Pregnancy.  Living without enough regular physical activity or exercise (sedentary lifestyle).  Smoking.  Having a job that requires long periods of standing or sitting in one place.  Being a certain age. Women in their 35s and 71s and men in their 58s are more likely to develop this condition. What are the signs or symptoms? Symptoms of this condition include:  Veins that are enlarged, bulging, or twisted (varicose veins).  Skin breakdown or ulcers.  Reddened skin or dark discoloration of skin on the leg between the knee and ankle.  Brown, smooth, tight, and painful skin just above the ankle, usually on the inside of the leg (lipodermatosclerosis).  Swelling of the legs. How is this diagnosed? This condition may be diagnosed based on:  Your medical history.  A physical exam.  Tests, such as: ? A procedure that creates an image of a blood vessel and  nearby organs and provides information about blood flow through the blood vessel (duplex ultrasound). ? A procedure that tests blood flow (plethysmography). ? A procedure that looks at the veins using X-ray and dye (venogram). How is this treated? The goals of treatment are to help you return to an active life and to minimize pain or disability. Treatment depends on the severity of your condition, and it may include:  Wearing compression stockings. These can help relieve symptoms and help prevent your condition from getting worse. However, they do not cure the condition.  Sclerotherapy. This procedure involves an injection of a solution that shrinks damaged veins.  Surgery. This may involve: ? Removing a diseased vein (vein stripping). ? Cutting off blood flow through the vein (laser ablation surgery). ? Repairing or reconstructing a valve within the affected vein. Follow these instructions at home:      Wear compression stockings as told by your health care provider. These stockings help to prevent blood clots and reduce swelling in your legs.  Take over-the-counter and prescription medicines only as told by your health care provider.  Stay active by exercising, walking, or doing different activities. Ask your health care provider what activities are safe for you and how much exercise you need.  Drink enough fluid to keep your urine pale yellow.  Do not use any products that contain nicotine or tobacco, such as cigarettes, e-cigarettes, and chewing tobacco. If you need help quitting, ask your health care provider.  Keep all follow-up visits as told by your  health care provider. This is important. Contact a health care provider if you:  Have redness, swelling, or more pain in the affected area.  See a red streak or line that goes up or down from the affected area.  Have skin breakdown or skin loss in the affected area, even if the breakdown is small.  Get an injury in the  affected area. Get help right away if:  You get an injury and an open wound in the affected area.  You have: ? Severe pain that does not get better with medicine. ? Sudden numbness or weakness in the foot or ankle below the affected area. ? Trouble moving your foot or ankle. ? A fever. ? Worse or persistent symptoms. ? Chest pain. ? Shortness of breath. Summary  Chronic venous insufficiency is a condition where the leg veins cannot effectively pump blood from the legs to the heart.  Chronic venous insufficiency occurs when the vein walls become stretched, weakened, or damaged, or when valves within the vein are damaged.  Treatment depends on how severe your condition is. It often involves wearing compression stockings and may involve having a procedure.  Make sure you stay active by exercising, walking, or doing different activities. Ask your health care provider what activities are safe for you and how much exercise you need. This information is not intended to replace advice given to you by your health care provider. Make sure you discuss any questions you have with your health care provider. Document Revised: 04/16/2018 Document Reviewed: 04/16/2018 Elsevier Patient Education  [place].

## 2019-10-22 NOTE — Progress Notes (Signed)
    Procedures performed today:    None.  Independent interpretation of notes and tests performed by another provider:   None.  Impression and Recommendations:    Primary osteoarthritis of right ankle Amber Rice returns, we did a right tibiotalar joint injection back in January, she was doing extremely well until recently, within 48 hours she has developed bilateral lower extremity edema, right slightly worse than left. On exam her ankle is nontender, but significantly swollen, right worse than left. Negative bilateral Homans' sign. She has good motion in her ankle itself, I do not think the problem is orthopedic here, because of her bilateral relatively symmetric lower extremity edema I think this is more medical, I do think she needs to see her PCP sometime today, I have placed her on Iran Planas, PA-C schedule. Return to see me for orthopedic processes as needed.    ___________________________________________ Gwen Her. Dianah Field, M.D., ABFM., CAQSM. Primary Care and [place] Instructor of [place] [employer] [facility] of Medicine

## 2019-10-23 LAB — CBC WITH DIFFERENTIAL/PLATELET
Absolute Monocytes: 667 cells/uL (ref 200–950)
Basophils Absolute: 114 cells/uL (ref 0–200)
Basophils Relative: 0.8 %
Eosinophils Absolute: 525 cells/uL — ABNORMAL HIGH (ref 15–500)
Eosinophils Relative: 3.7 %
HCT: 37.9 % (ref 35.0–45.0)
Hemoglobin: 12.7 g/dL (ref 11.7–15.5)
Lymphs Abs: 2542 cells/uL (ref 850–3900)
MCH: 30.5 pg (ref 27.0–33.0)
MCHC: 33.5 g/dL (ref 32.0–36.0)
MCV: 91.1 fL (ref 80.0–100.0)
MPV: 9.6 fL (ref 7.5–12.5)
Monocytes Relative: 4.7 %
Neutro Abs: 10352 cells/uL — ABNORMAL HIGH (ref 1500–7800)
Neutrophils Relative %: 72.9 %
Platelets: 504 10*3/uL — ABNORMAL HIGH (ref 140–400)
RBC: 4.16 10*6/uL (ref 3.80–5.10)
RDW: 15.8 % — ABNORMAL HIGH (ref 11.0–15.0)
Total Lymphocyte: 17.9 %
WBC: 14.2 10*3/uL — ABNORMAL HIGH (ref 3.8–10.8)

## 2019-10-23 LAB — COMPLETE METABOLIC PANEL WITHOUT GFR
AG Ratio: 1.5 (calc) (ref 1.0–2.5)
ALT: 18 U/L (ref 6–29)
AST: 16 U/L (ref 10–35)
Albumin: 4.2 g/dL (ref 3.6–5.1)
Alkaline phosphatase (APISO): 86 U/L (ref 37–153)
BUN: 14 mg/dL (ref 7–25)
CO2: 23 mmol/L (ref 20–32)
Calcium: 10.1 mg/dL (ref 8.6–10.4)
Chloride: 106 mmol/L (ref 98–110)
Creat: 0.85 mg/dL (ref 0.50–0.99)
GFR, Est African American: 85 mL/min/1.73m2
GFR, Est Non African American: 73 mL/min/1.73m2
Globulin: 2.8 g/dL (ref 1.9–3.7)
Glucose, Bld: 101 mg/dL — ABNORMAL HIGH (ref 65–99)
Potassium: 4.4 mmol/L (ref 3.5–5.3)
Sodium: 138 mmol/L (ref 135–146)
Total Bilirubin: 0.3 mg/dL (ref 0.2–1.2)
Total Protein: 7 g/dL (ref 6.1–8.1)

## 2019-10-23 LAB — TSH: TSH: 4.19 m[IU]/L (ref 0.40–4.50)

## 2019-10-23 LAB — ABN TEST REFUSAL: ABN TEST REFUSED: 7600

## 2019-10-23 NOTE — Telephone Encounter (Signed)
Amber Rice,   Still waiting for urine culture.  Kidneys look good. GREAT GFR  Thyroid in normal range and would not be causing any swelling.  For now continue with lasix and compression stockings. Let me know if any symptoms worsen.   -[name]

## 2019-10-24 ENCOUNTER — Encounter: Payer: Self-pay | Admitting: Physician Assistant

## 2019-10-24 DIAGNOSIS — R030 Elevated blood-pressure reading, without diagnosis of hypertension: Secondary | ICD-10-CM | POA: Insufficient documentation

## 2019-10-24 DIAGNOSIS — R809 Proteinuria, unspecified: Secondary | ICD-10-CM | POA: Insufficient documentation

## 2019-10-24 LAB — URINE CULTURE
MICRO NUMBER:: 10266849
Result:: NO GROWTH
SPECIMEN QUALITY:: ADEQUATE

## 2019-10-27 NOTE — Progress Notes (Signed)
Amber Rice,   No growth on urine culture. How is swelling?

## 2019-10-28 ENCOUNTER — Telehealth: Payer: Self-pay | Admitting: Neurology

## 2019-10-28 NOTE — Telephone Encounter (Signed)
Changed appt with patient.

## 2019-10-28 NOTE — Telephone Encounter (Signed)
Ok to do

## 2019-10-28 NOTE — Telephone Encounter (Signed)
Patient left vm, she has appt tomorrow for follow up on swelling. She states swelling is completely gone. She would like to change to a virtual appt. Please advise.

## 2019-10-29 ENCOUNTER — Telehealth (INDEPENDENT_AMBULATORY_CARE_PROVIDER_SITE_OTHER): Payer: Medicare Other | Admitting: Physician Assistant

## 2019-10-29 ENCOUNTER — Other Ambulatory Visit: Payer: Self-pay | Admitting: Physician Assistant

## 2019-10-29 ENCOUNTER — Encounter: Payer: Self-pay | Admitting: Physician Assistant

## 2019-10-29 VITALS — BP 115/80 | HR 91 | Temp 98.1°F | Ht 65.0 in | Wt 210.0 lb

## 2019-10-29 DIAGNOSIS — R600 Localized edema: Secondary | ICD-10-CM

## 2019-10-29 DIAGNOSIS — F17210 Nicotine dependence, cigarettes, uncomplicated: Secondary | ICD-10-CM

## 2019-10-29 NOTE — Progress Notes (Signed)
Swelling is gone Doing well

## 2019-10-29 NOTE — Progress Notes (Signed)
Patient ID: Amber Rice, female   DOB: 01/24/1956, 64 y.o.   MRN: [id number] .Marland KitchenVirtual Visit via Telephone Note  I connected with Amber Rice on 10/29/19 at 10:50 AM EDT by telephone and verified that I am speaking with the correct person using two identifiers.  Location: Patient: home Provider: clinic   I discussed the limitations, risks, security and privacy concerns of performing an evaluation and management service by telephone and the availability of in person appointments. I also discussed with the patient that there may be a patient responsible charge related to this service. The patient expressed understanding and agreed to proceed.   History of Present Illness: Pt is a 64 yo female who calls into the clinic to follow up on bilateral lower extremity edema. She has been taking lasix 20mg  daily and wearing compression stockings and edema was gone by Sunday. She is doing perfect. No problems or concerns.   .. Active Ambulatory Problems    Diagnosis Date Noted  . Hyperlipidemia 07/09/2013  . GERD (gastroesophageal reflux disease) 07/09/2013  . DDD (degenerative disc disease), lumbosacral 07/09/2013  . Bipolar 1 disorder ([place]) 07/09/2013  . Osteoarthritis of left glenohumeral joint 04/30/2014  . Left shoulder pain 03/01/2015  . Bilateral carpal tunnel syndrome 05/03/2015  . DDD (degenerative disc disease), cervical 05/03/2015  . Lumbar disc herniation with radiculopathy 05/03/2015  . Chronic SI joint pain 09/21/2015  . Leukocytosis 01/14/2016  . Thyroid activity decreased 01/14/2016  . Callus 05/08/2016  . Tobacco abuse 10/12/2016  . Colon polyp 01/04/2018  . BMI 35.0-35.9,adult 12/19/2018  . Myalgia 06/23/2019  . Chronic cough 07/23/2019  . Arthralgia 07/23/2019  . Glossitis 08/14/2019  . Primary osteoarthritis of right ankle 08/21/2019  . Bilateral lower extremity edema 10/22/2019  . Elevated blood pressure reading 10/24/2019  . Proteinuria 10/24/2019   Resolved  Ambulatory Problems    Diagnosis Date Noted  . Low back pain with sciatica 07/09/2013  . Impacted cerumen of left ear 04/23/2017   Past Medical History:  Diagnosis Date  . Degenerative disc disease, lumbar       Observations/Objective: No acute distress.  Normal breathing.   .. Today's Vitals   10/29/19 1031  BP: 115/80  Pulse: 91  Temp: 98.1 F (36.7 C)  TempSrc: Oral  Weight: 210 lb (95.3 kg)  Height: 5\' 5"  (1.651 m)   Body mass index is 34.95 kg/m.   Assessment and Plan: Marland KitchenMarland KitchenCherrise was seen today for follow-up.  Diagnoses and all orders for this visit:  Bilateral lower extremity edema   Continue compression stockings. Use lasix only as needed. Keep feet elevated when can. Walk and stay active. Follow up as needed.    Follow Up Instructions:    I discussed the assessment and treatment plan with the patient. The patient was provided an opportunity to ask questions and all were answered. The patient agreed with the plan and demonstrated an understanding of the instructions.   The patient was advised to call back or seek an in-person evaluation if the symptoms worsen or if the condition fails to improve as anticipated.  I provided 7 minutes of non-face-to-face time during this encounter.   Iran Planas, PA-C

## 2019-11-04 ENCOUNTER — Other Ambulatory Visit: Payer: Self-pay

## 2019-11-04 DIAGNOSIS — M19071 Primary osteoarthritis, right ankle and foot: Secondary | ICD-10-CM

## 2019-11-04 NOTE — Telephone Encounter (Signed)
Last written 10/07/2019 #15 with no refills.

## 2019-11-04 NOTE — Telephone Encounter (Signed)
Declined, it sounds like the reason for her visit and for the hydrocodone (the swelling), resolved with treatment with her PCP.

## 2019-11-05 ENCOUNTER — Other Ambulatory Visit: Payer: Self-pay

## 2019-11-05 ENCOUNTER — Ambulatory Visit: Payer: Medicare Other | Admitting: Sports Medicine

## 2019-11-05 DIAGNOSIS — M19071 Primary osteoarthritis, right ankle and foot: Secondary | ICD-10-CM

## 2019-11-05 NOTE — Telephone Encounter (Signed)
Can you sign denial again? I can't refuse scheduled drugs.

## 2019-11-05 NOTE — Telephone Encounter (Signed)
Hah, no problem.

## 2019-11-11 ENCOUNTER — Encounter: Payer: Self-pay | Admitting: Physician Assistant

## 2019-11-11 ENCOUNTER — Telehealth (INDEPENDENT_AMBULATORY_CARE_PROVIDER_SITE_OTHER): Payer: Medicare Other | Admitting: Physician Assistant

## 2019-11-11 VITALS — Ht 65.0 in | Wt 210.0 lb

## 2019-11-11 DIAGNOSIS — M5442 Lumbago with sciatica, left side: Secondary | ICD-10-CM

## 2019-11-11 DIAGNOSIS — G8929 Other chronic pain: Secondary | ICD-10-CM | POA: Diagnosis not present

## 2019-11-11 DIAGNOSIS — M5137 Other intervertebral disc degeneration, lumbosacral region: Secondary | ICD-10-CM

## 2019-11-11 DIAGNOSIS — M5441 Lumbago with sciatica, right side: Secondary | ICD-10-CM | POA: Diagnosis not present

## 2019-11-11 MED ORDER — HYDROCODONE-ACETAMINOPHEN 5-325 MG PO TABS: 1.0000 | ORAL_TABLET | Freq: Four times a day (QID) | ORAL | 0 refills | Status: DC | PRN

## 2019-11-11 MED ORDER — PREDNISONE 10 MG (21) PO TBPK: ORAL_TABLET | ORAL | 0 refills | Status: DC

## 2019-11-11 MED ORDER — CYCLOBENZAPRINE HCL 10 MG PO TABS: 10.0000 mg | ORAL_TABLET | Freq: Three times a day (TID) | ORAL | 0 refills | Status: DC | PRN

## 2019-11-11 NOTE — Progress Notes (Signed)
Patient ID: Amber Rice, female   DOB: 07/13/1956, 64 y.o.   MRN: [id number] .Marland KitchenVirtual Visit via Video Note  I connected with Amber Rice on 11/11/19 at  8:30 AM EDT by a video enabled telemedicine application and verified that I am speaking with the correct person using two identifiers.  Location: Patient: home Provider: clinic   I discussed the limitations of evaluation and management by telemedicine and the availability of in person appointments. The patient expressed understanding and agreed to proceed.  History of Present Illness: Pt is a 65 yo female with cervical and lumbar DDD for 20 years with history of chronic low back pain with intermittent flares. Her last MRI was 2016. She has had injections in the past. She was helping her sister move and on her feet for 14 hours and she woke up the next day and could not walk. She is having pain radiate into both legs. Her low back is tight. No leg weakness, bowel or bladder dysfunction or saddle anesthesia. Last episode was august.    .. Active Ambulatory Problems    Diagnosis Date Noted  . Hyperlipidemia 07/09/2013  . GERD (gastroesophageal reflux disease) 07/09/2013  . DDD (degenerative disc disease), lumbosacral 07/09/2013  . Bipolar 1 disorder ([place]) 07/09/2013  . Osteoarthritis of left glenohumeral joint 04/30/2014  . Left shoulder pain 03/01/2015  . Bilateral carpal tunnel syndrome 05/03/2015  . DDD (degenerative disc disease), cervical 05/03/2015  . Lumbar disc herniation with radiculopathy 05/03/2015  . Chronic SI joint pain 09/21/2015  . Leukocytosis 01/14/2016  . Thyroid activity decreased 01/14/2016  . Callus 05/08/2016  . Tobacco abuse 10/12/2016  . Colon polyp 01/04/2018  . BMI 35.0-35.9,adult 12/19/2018  . Myalgia 06/23/2019  . Chronic cough 07/23/2019  . Arthralgia 07/23/2019  . Glossitis 08/14/2019  . Primary osteoarthritis of right ankle 08/21/2019  . Bilateral lower extremity edema 10/22/2019  . Elevated  blood pressure reading 10/24/2019  . Proteinuria 10/24/2019  . Chronic bilateral low back pain with bilateral sciatica 11/11/2019   Resolved Ambulatory Problems    Diagnosis Date Noted  . Low back pain with sciatica 07/09/2013  . Impacted cerumen of left ear 04/23/2017   Past Medical History:  Diagnosis Date  . Degenerative disc disease, lumbar    Reviewed med, allergy, problem list.     Observations/Objective: No acute distress ROM at waist limited due to pain.   .. Today's Vitals   11/11/19 0813  Weight: 210 lb (95.3 kg)  Height: 5\' 5"  (1.651 m)   Body mass index is 34.95 kg/m.     Assessment and Plan: Marland KitchenMarland KitchenSabela was seen today for back pain.  Diagnoses and all orders for this visit:  DDD (degenerative disc disease), lumbosacral -     predniSONE (STERAPRED UNI-PAK 21 TAB) 10 MG (21) TBPK tablet; 12 day taper pack, use as directed -     cyclobenzaprine (FLEXERIL) 10 MG tablet; Take 1 tablet (10 mg total) by mouth 3 (three) times daily as needed for muscle spasms. -     HYDROcodone-acetaminophen (NORCO/VICODIN) 5-325 MG tablet; Take 1 tablet by mouth every 6 (six) hours as needed for moderate pain.  Chronic bilateral low back pain with bilateral sciatica -     predniSONE (STERAPRED UNI-PAK 21 TAB) 10 MG (21) TBPK tablet; 12 day taper pack, use as directed -     cyclobenzaprine (FLEXERIL) 10 MG tablet; Take 1 tablet (10 mg total) by mouth 3 (three) times daily as needed for muscle spasms. -  HYDROcodone-acetaminophen (NORCO/VICODIN) 5-325 MG tablet; Take 1 tablet by mouth every 6 (six) hours as needed for moderate pain.   May be time to consider updating MRI. Pt sees Dr. Darene Lamer and encourage discussion about this with him. For flare prednisone taper, flexeril, small quantity of norco, ibuprofen, icy hot patches, icing and exercises. Pt warned not to take norco and flexeril together.   Marland KitchenMarland KitchenPDMP reviewed during this encounter.    Follow Up Instructions:    I  discussed the assessment and treatment plan with the patient. The patient was provided an opportunity to ask questions and all were answered. The patient agreed with the plan and demonstrated an understanding of the instructions.   The patient was advised to call back or seek an in-person evaluation if the symptoms worsen or if the condition fails to improve as anticipated.  I provided 15 minutes of non-face-to-face time during this encounter.   Iran Planas, PA-C

## 2019-11-11 NOTE — Progress Notes (Signed)
Patient was helping her sister move Saturday and now has pain in lower back States she has had issues for 20 years (DDD)  Will take vitals and give to [facility] at appt

## 2019-11-13 ENCOUNTER — Other Ambulatory Visit: Payer: Self-pay | Admitting: Physician Assistant

## 2019-11-13 DIAGNOSIS — R600 Localized edema: Secondary | ICD-10-CM

## 2019-11-18 DIAGNOSIS — M19072 Primary osteoarthritis, left ankle and foot: Secondary | ICD-10-CM | POA: Diagnosis not present

## 2019-11-18 DIAGNOSIS — M79672 Pain in left foot: Secondary | ICD-10-CM | POA: Diagnosis not present

## 2019-11-24 ENCOUNTER — Ambulatory Visit: Payer: Medicare Other | Admitting: Sports Medicine

## 2019-11-26 ENCOUNTER — Telehealth (HOSPITAL_COMMUNITY): Payer: Self-pay

## 2019-11-26 ENCOUNTER — Encounter (HOSPITAL_COMMUNITY): Payer: Self-pay | Admitting: Psychiatry

## 2019-11-26 ENCOUNTER — Ambulatory Visit (INDEPENDENT_AMBULATORY_CARE_PROVIDER_SITE_OTHER): Payer: Medicare Other | Admitting: Psychiatry

## 2019-11-26 DIAGNOSIS — F315 Bipolar disorder, current episode depressed, severe, with psychotic features: Secondary | ICD-10-CM

## 2019-11-26 DIAGNOSIS — G259 Extrapyramidal and movement disorder, unspecified: Secondary | ICD-10-CM | POA: Diagnosis not present

## 2019-11-26 DIAGNOSIS — G8929 Other chronic pain: Secondary | ICD-10-CM

## 2019-11-26 NOTE — Telephone Encounter (Addendum)
Patient called and stated her Lithium level was 0.5 on 08/04/2019 orders done by Iran Planas upstairs from [place] (in patient chart under labs done on 08/04/2019)

## 2019-11-26 NOTE — Progress Notes (Signed)
Patient ID: Amber Rice, female   DOB: 1956/01/26, 64 y.o.   MRN: [id number] [place] [id number] 64 y.o.  11/26/2019  Chief Complaint: Follow up bipolar disorder. Med review     I connected with Amber Rice on 11/26/19 at 10:45 AM EDT by telephone and verified that I am speaking with the correct person using two identifiers.   I discussed the limitations, risks, security and privacy concerns of performing an evaluation and management service by telephone and the availability of in person appointments. I also discussed with the patient that there may be a patient responsible charge related to this service. The patient expressed understanding and agreed to proceed.  History of Present Illness:   Diagnosed with bipolar disorder depressed. Anxiety disorder NOS. Insomnia  Has back pain and follows up with Dr. [name] or [name].   Doing fair, balanced mood, gets her blood work with PCP and not high.  Niece diagnosed with lymphoma is a stress    no paranoia  Duration of bipolar :12 years  Aggravating factor back condition Depression scale 7/10 . 10 being no depression  Modifying factors; walking when she can . Reading books   Past Medical History:  Diagnosis Date  . Degenerative disc disease, lumbar    diagnosed in 2005  . Hyperlipidemia   . Impacted cerumen of left ear 04/23/2017   Family History  Problem Relation Age of Onset  . Cancer Mother   . Heart attack Father   . Hyperlipidemia Father   . Cancer Maternal Grandmother   . Cancer Paternal Grandmother   . Bipolar disorder Paternal Uncle        Committed suicide in his 57's  . Dementia Paternal Uncle   . Depression Cousin   . Schizophrenia Cousin   . Multiple sclerosis Sister   . Breast cancer Cousin   . Alcohol abuse Neg Hx   . Anxiety disorder Neg Hx     Outpatient Encounter Medications as of 11/26/2019  Medication Sig  . cyclobenzaprine (FLEXERIL) 10  MG tablet Take 1 tablet (10 mg total) by mouth 3 (three) times daily as needed for muscle spasms.  . furosemide (LASIX) 20 MG tablet Take 1 tablet (20 mg total) by mouth daily as needed.  Marland Kitchen HYDROcodone-acetaminophen (NORCO/VICODIN) 5-325 MG tablet Take 1 tablet by mouth every 6 (six) hours as needed for moderate pain.  Marland Kitchen levothyroxine (SYNTHROID) 50 MCG tablet Take 1 tablet (50 mcg total) by mouth daily before breakfast.  . lithium carbonate (LITHOBID) 300 MG CR tablet Take 1 tablet (300 mg total) by mouth 2 (two) times daily.  . predniSONE (STERAPRED UNI-PAK 21 TAB) 10 MG (21) TBPK tablet 12 day taper pack, use as directed  . sertraline (ZOLOFT) 100 MG tablet Take 1 tablet (100 mg total) by mouth 2 (two) times daily.   No facility-administered encounter medications on file as of 11/26/2019.    Recent Results (from the past 2160 hour(s))  POCT UA - Microalbumin     Status: Abnormal   Collection Time: 10/22/19 11:20 AM  Result Value Ref Range   Microalbumin Ur, POC 30 mg/L   Creatinine, POC 50 mg/dL   Albumin/Creatinine Ratio, Urine, POC 30-300   Urine Culture     Status: None   Collection Time: 10/22/19 11:25 AM   Specimen: Urine  Result Value Ref Range   MICRO NUMBER: [id number]    SPECIMEN QUALITY: Adequate    Sample Source NOT GIVEN  STATUS: FINAL    Result: No Growth   COMPLETE METABOLIC PANEL WITH GFR     Status: Abnormal   Collection Time: 10/22/19 11:35 AM  Result Value Ref Range   Glucose, Bld 101 (H) 65 - 99 mg/dL    Comment: .            Fasting reference interval . For someone without known diabetes, a glucose value between 100 and 125 mg/dL is consistent with prediabetes and should be confirmed with a follow-up test. .    BUN 14 7 - 25 mg/dL   Creat 0.85 0.50 - 0.99 mg/dL    Comment: For patients >34 years of age, the reference limit for Creatinine is approximately 13% higher for people identified as African-American. .    GFR, Est Non African American 73 >  OR = 60 mL/min/1.55m2   GFR, Est African American 85 > OR = 60 mL/min/1.34m2   BUN/Creatinine Ratio NOT APPLICABLE 6 - 22 (calc)   Sodium 138 135 - 146 mmol/L   Potassium 4.4 3.5 - 5.3 mmol/L   Chloride 106 98 - 110 mmol/L   CO2 23 20 - 32 mmol/L   Calcium 10.1 8.6 - 10.4 mg/dL   Total Protein 7.0 6.1 - 8.1 g/dL   Albumin 4.2 3.6 - 5.1 g/dL   Globulin 2.8 1.9 - 3.7 g/dL (calc)   AG Ratio 1.5 1.0 - 2.5 (calc)   Total Bilirubin 0.3 0.2 - 1.2 mg/dL   Alkaline phosphatase (APISO) 86 37 - 153 U/L   AST 16 10 - 35 U/L   ALT 18 6 - 29 U/L  CBC with Differential/Platelet     Status: Abnormal   Collection Time: 10/22/19 11:35 AM  Result Value Ref Range   WBC 14.2 (H) 3.8 - 10.8 Thousand/uL   RBC 4.16 3.80 - 5.10 Million/uL   Hemoglobin 12.7 11.7 - 15.5 g/dL   HCT 37.9 35.0 - 45.0 %   MCV 91.1 80.0 - 100.0 fL   MCH 30.5 27.0 - 33.0 pg   MCHC 33.5 32.0 - 36.0 g/dL   RDW 15.8 (H) 11.0 - 15.0 %   Platelets 504 (H) 140 - 400 Thousand/uL   MPV 9.6 7.5 - 12.5 fL   Neutro Abs 10,352 (H) 1,500 - 7,800 cells/uL   Lymphs Abs 2,542 850 - 3,900 cells/uL   Absolute Monocytes 667 200 - 950 cells/uL   Eosinophils Absolute 525 (H) 15 - 500 cells/uL   Basophils Absolute 114 0 - 200 cells/uL   Neutrophils Relative % 72.9 %   Total Lymphocyte 17.9 %   Monocytes Relative 4.7 %   Eosinophils Relative 3.7 %   Basophils Relative 0.8 %  TSH     Status: None   Collection Time: 10/22/19 11:35 AM  Result Value Ref Range   TSH 4.19 0.40 - 4.50 mIU/L  ABN Test Refusal     Status: None   Collection Time: 10/22/19 11:35 AM  Result Value Ref Range   RAM      Comment: . Be advised that your patient has indicated on the advance beneficiary notice their decision not to receive the following laboratory tests. As a result, the tests will not be performed. .    ABN TEST REFUSED 7,600     There were no vitals taken for this visit.   Review of Systems  Cardiovascular: Negative for chest pain.   Musculoskeletal: Positive for back pain.  Psychiatric/Behavioral: Negative for substance abuse and suicidal ideas.  Mental Status Examination  Appearance:  Alert: Yes Attention: fair  Cooperative: Yes Eye Contact:  Speech: normal Psychomotor Activity: Decreased Memory/Concentration: adequate Oriented: person, place, time/date and situation Mood: fair Affect: congruent Thought Processes and Associations: Coherent no paranoia as of now [employer] of Knowledge: Fair Thought Content: Suicidal ideation and Homicidal ideation were denied Insight: Fair Judgement: Fair  Diagnosis: Bipolar disorder type I manic per history for psychotic features. Insomnia NOS. Adjustment disorder with anxiety . EPS Treatment Plan:   1. Bipolar: depression  Stable on meds, continue lithium and zoloft She has lithium levels done will send [place]. Tsh is better and being managed with Pcp 2. Depression:better.continue zoloft. Not paranoid 3. Insomnia:irregular sleep hygiene reviewed. Back condition can effect sleep. Is on meds  Fu 3-20m.  I discussed the assessment and treatment plan with the patient. The patient was provided an opportunity to ask questions and all were answered. The patient agreed with the plan and demonstrated an understanding of the instructions.   The patient was advised to call back or seek an in-person evaluation if the symptoms worsen or if the condition fails to improve as anticipated.  I provided 15  minutes of non-face-to-face time during this encounter. Fu 63m.  [name], MD

## 2019-11-27 NOTE — Telephone Encounter (Signed)
Ok noted  

## 2019-12-02 DIAGNOSIS — Z23 Encounter for immunization: Secondary | ICD-10-CM | POA: Diagnosis not present

## 2019-12-22 ENCOUNTER — Other Ambulatory Visit: Payer: Self-pay

## 2019-12-22 ENCOUNTER — Telehealth (INDEPENDENT_AMBULATORY_CARE_PROVIDER_SITE_OTHER): Payer: Medicare Other | Admitting: Physician Assistant

## 2019-12-22 ENCOUNTER — Other Ambulatory Visit: Payer: Medicare Other

## 2019-12-22 ENCOUNTER — Encounter: Payer: Self-pay | Admitting: Physician Assistant

## 2019-12-22 ENCOUNTER — Telehealth: Payer: Self-pay | Admitting: Neurology

## 2019-12-22 VITALS — BP 136/105 | HR 101 | Ht 65.0 in | Wt 210.0 lb

## 2019-12-22 DIAGNOSIS — R5383 Other fatigue: Secondary | ICD-10-CM | POA: Diagnosis not present

## 2019-12-22 DIAGNOSIS — R030 Elevated blood-pressure reading, without diagnosis of hypertension: Secondary | ICD-10-CM | POA: Diagnosis not present

## 2019-12-22 DIAGNOSIS — E039 Hypothyroidism, unspecified: Secondary | ICD-10-CM | POA: Diagnosis not present

## 2019-12-22 DIAGNOSIS — R531 Weakness: Secondary | ICD-10-CM

## 2019-12-22 NOTE — Progress Notes (Signed)
Patient ID: Amber Rice, female   DOB: March 14, 1956, 64 y.o.   MRN: [id number] .Marland KitchenVirtual Visit via Telephone Note  I connected with Amber Rice on 12/22/19 at 10:10 AM EDT by telephone and verified that I am speaking with the correct person using two identifiers.  Location: Patient: home Provider: home   I discussed the limitations, risks, security and privacy concerns of performing an evaluation and management service by telephone and the availability of in person appointments. I also discussed with the patient that there may be a patient responsible charge related to this service. The patient expressed understanding and agreed to proceed.   History of Present Illness: Pt is a 64 yo female with hypothyroidism and chronic low back pain who calls into the clinic to discuss fatigue. Her fatigue started suddenly may 6th. That night she slept for 18 hours and then awake for a few hours and back to sleep for 4 hours. She has had no fever, chills, headaches, SOB, cough, change in taste or smell. No medication changes. No CP, palpitations. Not been outside and no real risk of tick bites. No pets. Her BP was elevated when checked this morning and checked 2 other times and 152/104. No hx of HTN. She does mention some upper arm weakness.   No mood changes or depression. No swelling of lower legs.   Pt did get covid vaccine on April 27th. She was tired the next day but then felt fine.   Hx of CMV/EBV.   .. Active Ambulatory Problems    Diagnosis Date Noted  . Hyperlipidemia 07/09/2013  . GERD (gastroesophageal reflux disease) 07/09/2013  . DDD (degenerative disc disease), lumbosacral 07/09/2013  . Bipolar 1 disorder ([place]) 07/09/2013  . Osteoarthritis of left glenohumeral joint 04/30/2014  . Left shoulder pain 03/01/2015  . Bilateral carpal tunnel syndrome 05/03/2015  . DDD (degenerative disc disease), cervical 05/03/2015  . Lumbar disc herniation with radiculopathy 05/03/2015  . Chronic SI  joint pain 09/21/2015  . Leukocytosis 01/14/2016  . Thyroid activity decreased 01/14/2016  . Callus 05/08/2016  . Tobacco abuse 10/12/2016  . Colon polyp 01/04/2018  . BMI 35.0-35.9,adult 12/19/2018  . Myalgia 06/23/2019  . Chronic cough 07/23/2019  . Arthralgia 07/23/2019  . Glossitis 08/14/2019  . Primary osteoarthritis of right ankle 08/21/2019  . Bilateral lower extremity edema 10/22/2019  . Elevated blood pressure reading 10/24/2019  . Proteinuria 10/24/2019  . Chronic bilateral low back pain with bilateral sciatica 11/11/2019   Resolved Ambulatory Problems    Diagnosis Date Noted  . Low back pain with sciatica 07/09/2013  . Impacted cerumen of left ear 04/23/2017   Past Medical History:  Diagnosis Date  . Degenerative disc disease, lumbar    Reviewed med, allergy, problem list.     Observations/Objective: No acute distress Normal breathing.   .. Today's Vitals   12/22/19 0918 12/22/19 0919  BP: (!) 138/103 (!) 136/105  Pulse: 90 (!) 101  Weight: 210 lb (95.3 kg)   Height: 5\' 5"  (1.651 m)    Body mass index is 34.95 kg/m.    Assessment and Plan: Marland KitchenMarland KitchenPatzy was seen today for fatigue.  Diagnoses and all orders for this visit:  Weakness -     TSH -     CBC with Differential/Platelet -     Comprehensive metabolic panel  Fatigue, unspecified type -     TSH -     CBC with Differential/Platelet -     Comprehensive metabolic panel  Acquired hypothyroidism -  TSH   First get COVID tested.  If negative get labs to evaluate fatigue causes.  Rest and hydrate.  Less likely side effect of vaccine but cannot completely rule out.  BP is elevated. Check for next week and report daily readings.     Follow Up Instructions:    I discussed the assessment and treatment plan with the patient. The patient was provided an opportunity to ask questions and all were answered. The patient agreed with the plan and demonstrated an understanding of the  instructions.   The patient was advised to call back or seek an in-person evaluation if the symptoms worsen or if the condition fails to improve as anticipated.  I provided 20 minutes of non-face-to-face time during this encounter.   Iran Planas, PA-C

## 2019-12-22 NOTE — Telephone Encounter (Signed)
Patient called back, left vm, and states she had a rapid Covid test at the pharmacy which was negative.  Amber Rice.

## 2019-12-22 NOTE — Progress Notes (Signed)
Extreme fatigue - started 12/11/2019 Went to sleep at 7 pm, slept for 18 hours, up for 2 hours, went back to sleep for 5 hours Has been like this since that date No other sick symptoms Feels like "moving in slow motion"  Blood pressure high today (has checked twice), rechecking before appointment.

## 2019-12-23 ENCOUNTER — Other Ambulatory Visit: Payer: Self-pay | Admitting: Neurology

## 2019-12-23 DIAGNOSIS — R7989 Other specified abnormal findings of blood chemistry: Secondary | ICD-10-CM

## 2019-12-23 DIAGNOSIS — E039 Hypothyroidism, unspecified: Secondary | ICD-10-CM

## 2019-12-23 LAB — TSH: TSH: 5.57 u[IU]/mL — ABNORMAL HIGH (ref 0.450–4.500)

## 2019-12-23 LAB — COMPREHENSIVE METABOLIC PANEL
ALT: 17 IU/L (ref 0–32)
AST: 14 IU/L (ref 0–40)
Albumin/Globulin Ratio: 1.7 (ref 1.2–2.2)
Albumin: 4.3 g/dL (ref 3.8–4.8)
Alkaline Phosphatase: 114 IU/L (ref 48–121)
BUN/Creatinine Ratio: 23 (ref 12–28)
BUN: 20 mg/dL (ref 8–27)
Bilirubin Total: <0.2 mg/dL (ref 0.0–1.2)
CO2: 18 mmol/L — ABNORMAL LOW (ref 20–29)
Calcium: 9.5 mg/dL (ref 8.7–10.3)
Chloride: 104 mmol/L (ref 96–106)
Creatinine, Ser: 0.88 mg/dL (ref 0.57–1.00)
GFR calc Af Amer: 81 mL/min/{1.73_m2} (ref >59)
GFR calc non Af Amer: 70 mL/min/{1.73_m2} (ref >59)
Globulin, Total: 2.5 g/dL (ref 1.5–4.5)
Glucose: 128 mg/dL — ABNORMAL HIGH (ref 65–99)
Potassium: 4.4 mmol/L (ref 3.5–5.2)
Sodium: 138 mmol/L (ref 134–144)
Total Protein: 6.8 g/dL (ref 6.0–8.5)

## 2019-12-23 LAB — CBC WITH DIFFERENTIAL/PLATELET
Basophils Absolute: 0.1 10*3/uL (ref 0.0–0.2)
Basos: 1 %
EOS (ABSOLUTE): 0.4 10*3/uL (ref 0.0–0.4)
Eos: 2 %
Hematocrit: 41.4 % (ref 34.0–46.6)
Hemoglobin: 13.8 g/dL (ref 11.1–15.9)
Immature Grans (Abs): 0.3 10*3/uL — ABNORMAL HIGH (ref 0.0–0.1)
Immature Granulocytes: 2 %
Lymphocytes Absolute: 3.4 10*3/uL — ABNORMAL HIGH (ref 0.7–3.1)
Lymphs: 21 %
MCH: 31.7 pg (ref 26.6–33.0)
MCHC: 33.3 g/dL (ref 31.5–35.7)
MCV: 95 fL (ref 79–97)
Monocytes Absolute: 0.7 10*3/uL (ref 0.1–0.9)
Monocytes: 5 %
Neutrophils Absolute: 10.8 10*3/uL — ABNORMAL HIGH (ref 1.4–7.0)
Neutrophils: 69 %
Platelets: 491 10*3/uL — ABNORMAL HIGH (ref 150–450)
RBC: 4.36 x10E6/uL (ref 3.77–5.28)
RDW: 14 % (ref 11.7–15.4)
WBC: 15.7 10*3/uL — ABNORMAL HIGH (ref 3.4–10.8)

## 2019-12-23 MED ORDER — LEVOTHYROXINE SODIUM 75 MCG PO TABS: 75.0000 ug | ORAL_TABLET | Freq: Every day | ORAL | 1 refills | Status: DC

## 2019-12-23 NOTE — Telephone Encounter (Signed)
Ok go ahead and get labs now.

## 2019-12-23 NOTE — Addendum Note (Signed)
Addended by: Donella Stade on: 12/23/2019 08:02 AM   Modules accepted: Orders

## 2019-12-23 NOTE — Progress Notes (Signed)
WBC is a little elevated but hx of elevation and fairly stable from that. Your eosinophils are a little up indicating more allergies potentially as culprit. Make sure taking anti histamine like zyrtec daily.   No anemia.   Kidney and liver look great.   Your TSH is elevated meaning you do not have enough thyroid hormone. We need to increase your dose. Make sure taking first thing in am 30 minutes before eating. Recheck in 4 weeks. This can be a reason for fatigue.   Your CO2 in blood is a low. You are not complaining of SOB but sometimes when lungs not working right this can happen. I know you report not to be SOB.   If symptoms worsening need in office visit to listen to lungs, get pulse ox and maybe do walk test etc.

## 2019-12-24 ENCOUNTER — Telehealth: Payer: Medicare Other | Admitting: Physician Assistant

## 2019-12-29 ENCOUNTER — Other Ambulatory Visit (HOSPITAL_COMMUNITY): Payer: Self-pay | Admitting: Psychiatry

## 2019-12-29 ENCOUNTER — Telehealth: Payer: Self-pay | Admitting: Neurology

## 2019-12-29 NOTE — Telephone Encounter (Signed)
Left message on machine for patient letting her know to keep [place] updated over the next two weeks if she does not feel better.

## 2019-12-29 NOTE — Telephone Encounter (Signed)
No, patient states she is sleeping for 4-6 hours at a time, up for 1-2 hours, and this cycles. It is all day and all night.

## 2019-12-29 NOTE — Telephone Encounter (Signed)
Spoke with patient. Made aware. She is feeling better, but still sleeping all day long.

## 2019-12-29 NOTE — Telephone Encounter (Signed)
Patient called and left vm with blood pressures from the past week. She was told to call. 12/22/2019: 152/104 12/23/2019: 165/106 12/24/2019: 152/106 12/25/2019: 135/103 12/26/2019: 120/70 12/27/2019: 115/76 12/28/2019: 111/68

## 2019-12-29 NOTE — Telephone Encounter (Signed)
Ok are you awake at night. Make sure not getting days and night switched up.

## 2019-12-29 NOTE — Telephone Encounter (Signed)
For the last 3 days BP has been perfect! GREAT. Are you feeling any better?

## 2019-12-29 NOTE — Telephone Encounter (Signed)
Ok if this is not changing or improving in next 2 weeks or new symptoms let me know. Labs looked good. BP now good. And you have no other symptoms. I am hoping is some post viral fatigue.

## 2019-12-30 DIAGNOSIS — Z23 Encounter for immunization: Secondary | ICD-10-CM | POA: Diagnosis not present

## 2020-01-14 ENCOUNTER — Other Ambulatory Visit: Payer: Self-pay | Admitting: Physician Assistant

## 2020-01-14 DIAGNOSIS — G8929 Other chronic pain: Secondary | ICD-10-CM

## 2020-01-14 DIAGNOSIS — M5442 Lumbago with sciatica, left side: Secondary | ICD-10-CM

## 2020-01-14 DIAGNOSIS — M5137 Other intervertebral disc degeneration, lumbosacral region: Secondary | ICD-10-CM

## 2020-01-14 MED ORDER — CYCLOBENZAPRINE HCL 10 MG PO TABS: 10.0000 mg | ORAL_TABLET | Freq: Three times a day (TID) | ORAL | 0 refills | Status: DC | PRN

## 2020-01-14 NOTE — Telephone Encounter (Signed)
Hydrocodone filled 11/11/2019 #20 with no refills Flexeril 11/11/2019 #30 no refills

## 2020-01-19 ENCOUNTER — Other Ambulatory Visit: Payer: Self-pay | Admitting: Physician Assistant

## 2020-01-19 ENCOUNTER — Telehealth (INDEPENDENT_AMBULATORY_CARE_PROVIDER_SITE_OTHER): Payer: Medicare Other | Admitting: Physician Assistant

## 2020-01-19 ENCOUNTER — Encounter: Payer: Self-pay | Admitting: Physician Assistant

## 2020-01-19 VITALS — BP 122/74 | HR 88 | Temp 98.2°F | Ht 65.0 in | Wt 220.0 lb

## 2020-01-19 DIAGNOSIS — I878 Other specified disorders of veins: Secondary | ICD-10-CM

## 2020-01-19 DIAGNOSIS — M5116 Intervertebral disc disorders with radiculopathy, lumbar region: Secondary | ICD-10-CM | POA: Diagnosis not present

## 2020-01-19 DIAGNOSIS — G8929 Other chronic pain: Secondary | ICD-10-CM | POA: Diagnosis not present

## 2020-01-19 DIAGNOSIS — M5441 Lumbago with sciatica, right side: Secondary | ICD-10-CM | POA: Diagnosis not present

## 2020-01-19 DIAGNOSIS — Z79899 Other long term (current) drug therapy: Secondary | ICD-10-CM

## 2020-01-19 DIAGNOSIS — E039 Hypothyroidism, unspecified: Secondary | ICD-10-CM | POA: Diagnosis not present

## 2020-01-19 DIAGNOSIS — R600 Localized edema: Secondary | ICD-10-CM | POA: Diagnosis not present

## 2020-01-19 DIAGNOSIS — M5137 Other intervertebral disc degeneration, lumbosacral region: Secondary | ICD-10-CM

## 2020-01-19 DIAGNOSIS — M5442 Lumbago with sciatica, left side: Secondary | ICD-10-CM | POA: Diagnosis not present

## 2020-01-19 DIAGNOSIS — M51379 Other intervertebral disc degeneration, lumbosacral region without mention of lumbar back pain or lower extremity pain: Secondary | ICD-10-CM

## 2020-01-19 MED ORDER — FUROSEMIDE 20 MG PO TABS: ORAL_TABLET | ORAL | 1 refills | Status: DC

## 2020-01-19 NOTE — Progress Notes (Signed)
Feet/ankle swelling Started last Wednesday AM Wearing compression stockings/elevating legs - helping some

## 2020-01-19 NOTE — Progress Notes (Signed)
Patient ID: Amber Rice, female   DOB: 1955-09-19, 64 y.o.   MRN: [id number] .Marland KitchenVirtual Visit via Telephone Note  I connected with Bora Bost on 01/19/20 at 11:10 AM EDT by telephone and verified that I am speaking with the correct person using two identifiers.  Location: Patient: home Provider: clinic   I discussed the limitations, risks, security and privacy concerns of performing an evaluation and management service by telephone and the availability of in person appointments. I also discussed with the patient that there may be a patient responsible charge related to this service. The patient expressed understanding and agreed to proceed.   History of Present Illness: Patient is a 64 year old female with chronic low back pain and degenerative disc disease, hypothyroidism who presents to the clinic with intermittent lower bilateral extremity edema.  She had this for the first time back in March.  It resolved with a few days of Lasix.  She is started some of the Lasix left over from March.  It has almost completely resolved today.  She denies any shortness of breath, chest pain, problems lying flat.  She has been taking a lot of ibuprofen due to her low back pain.  Her thyroid medication was increased at last visit due to her TSH being elevated.  She has not had this rechecked.  No other new medications have been started.  She has tried to keep some compression on them which helps as well. .. Active Ambulatory Problems    Diagnosis Date Noted  . Hyperlipidemia 07/09/2013  . GERD (gastroesophageal reflux disease) 07/09/2013  . DDD (degenerative disc disease), lumbosacral 07/09/2013  . Bipolar 1 disorder ([place]) 07/09/2013  . Osteoarthritis of left glenohumeral joint 04/30/2014  . Left shoulder pain 03/01/2015  . Bilateral carpal tunnel syndrome 05/03/2015  . DDD (degenerative disc disease), cervical 05/03/2015  . Lumbar disc herniation with radiculopathy 05/03/2015  . Chronic SI joint  pain 09/21/2015  . Leukocytosis 01/14/2016  . Acquired hypothyroidism 01/14/2016  . Callus 05/08/2016  . Tobacco abuse 10/12/2016  . Colon polyp 01/04/2018  . BMI 35.0-35.9,adult 12/19/2018  . Myalgia 06/23/2019  . Chronic cough 07/23/2019  . Arthralgia 07/23/2019  . Glossitis 08/14/2019  . Primary osteoarthritis of right ankle 08/21/2019  . Bilateral lower extremity edema 10/22/2019  . Elevated blood pressure reading 10/24/2019  . Proteinuria 10/24/2019  . Chronic bilateral low back pain with bilateral sciatica 11/11/2019  . Weakness 12/22/2019  . Fatigue 12/22/2019  . Chronic venous stasis 01/19/2020   Resolved Ambulatory Problems    Diagnosis Date Noted  . Low back pain with sciatica 07/09/2013  . Impacted cerumen of left ear 04/23/2017   Past Medical History:  Diagnosis Date  . Degenerative disc disease, lumbar      Reviewed med, allergies, problem list.   Observations/Objective: No acute distress. Normal breathing.  No wheezing.   No vitals.    Assessment and Plan: Marland KitchenMarland KitchenTavi was seen today for leg swelling.  Diagnoses and all orders for this visit:  Chronic venous stasis -     furosemide (LASIX) 20 MG tablet; Take as needed once daily for lower extremity swelling.  Chronic bilateral low back pain with bilateral sciatica  Lumbar disc herniation with radiculopathy  DDD (degenerative disc disease), lumbosacral  Bilateral lower extremity edema -     furosemide (LASIX) 20 MG tablet; Take as needed once daily for lower extremity swelling.  Acquired hypothyroidism -     TSH  Medication management -     Lithium level  March worked up minus echo for lower extremity edema. No SOB, orthopnea. Given lasix prn. Discussed compression stockings, elevation, low salt diet. Follow up as needed.   Recheck TSH. Elevated last check.   Encouraged to follow up with sports medicine for low back pain. Consider facet injections Dr. [name] mentioned. She is taking a lot  of NSAIDs. Could be causing some swelling.   lithum level ordered for [facility].     Follow Up Instructions:    I discussed the assessment and treatment plan with the patient. The patient was provided an opportunity to ask questions and all were answered. The patient agreed with the plan and demonstrated an understanding of the instructions.   The patient was advised to call back or seek an in-person evaluation if the symptoms worsen or if the condition fails to improve as anticipated.  I provided 12 minutes of non-face-to-face time during this encounter.   Iran Planas, PA-C

## 2020-01-21 ENCOUNTER — Other Ambulatory Visit: Payer: Medicare Other

## 2020-01-21 ENCOUNTER — Other Ambulatory Visit: Payer: Self-pay

## 2020-02-05 DIAGNOSIS — F319 Bipolar disorder, unspecified: Secondary | ICD-10-CM | POA: Diagnosis not present

## 2020-02-05 DIAGNOSIS — F17210 Nicotine dependence, cigarettes, uncomplicated: Secondary | ICD-10-CM | POA: Diagnosis not present

## 2020-02-05 DIAGNOSIS — E785 Hyperlipidemia, unspecified: Secondary | ICD-10-CM | POA: Diagnosis not present

## 2020-02-05 DIAGNOSIS — R9431 Abnormal electrocardiogram [ECG] [EKG]: Secondary | ICD-10-CM | POA: Diagnosis not present

## 2020-02-05 DIAGNOSIS — Z79899 Other long term (current) drug therapy: Secondary | ICD-10-CM | POA: Diagnosis not present

## 2020-02-05 DIAGNOSIS — Z888 Allergy status to other drugs, medicaments and biological substances status: Secondary | ICD-10-CM | POA: Diagnosis not present

## 2020-02-05 DIAGNOSIS — F419 Anxiety disorder, unspecified: Secondary | ICD-10-CM | POA: Diagnosis not present

## 2020-02-05 DIAGNOSIS — R600 Localized edema: Secondary | ICD-10-CM | POA: Diagnosis not present

## 2020-02-12 ENCOUNTER — Other Ambulatory Visit: Payer: Self-pay | Admitting: Physician Assistant

## 2020-02-15 ENCOUNTER — Other Ambulatory Visit: Payer: Self-pay | Admitting: Physician Assistant

## 2020-02-15 DIAGNOSIS — G8929 Other chronic pain: Secondary | ICD-10-CM

## 2020-02-15 DIAGNOSIS — M5137 Other intervertebral disc degeneration, lumbosacral region: Secondary | ICD-10-CM

## 2020-02-15 DIAGNOSIS — M5442 Lumbago with sciatica, left side: Secondary | ICD-10-CM

## 2020-02-15 DIAGNOSIS — M5441 Lumbago with sciatica, right side: Secondary | ICD-10-CM

## 2020-02-16 MED ORDER — HYDROCODONE-ACETAMINOPHEN 5-325 MG PO TABS: 1.0000 | ORAL_TABLET | Freq: Four times a day (QID) | ORAL | 0 refills | Status: DC | PRN

## 2020-02-16 MED ORDER — CYCLOBENZAPRINE HCL 10 MG PO TABS: 10.0000 mg | ORAL_TABLET | Freq: Three times a day (TID) | ORAL | 0 refills | Status: DC | PRN

## 2020-02-16 NOTE — Telephone Encounter (Signed)
Duplicate request. Unable to decline the request. Restricted access.

## 2020-02-16 NOTE — Telephone Encounter (Signed)
Last office visit - 12/22/19  Last written - 11/11/19

## 2020-02-18 DIAGNOSIS — M5137 Other intervertebral disc degeneration, lumbosacral region: Secondary | ICD-10-CM | POA: Diagnosis not present

## 2020-02-18 DIAGNOSIS — M9901 Segmental and somatic dysfunction of cervical region: Secondary | ICD-10-CM | POA: Diagnosis not present

## 2020-02-18 DIAGNOSIS — M9903 Segmental and somatic dysfunction of lumbar region: Secondary | ICD-10-CM | POA: Diagnosis not present

## 2020-02-18 DIAGNOSIS — M9902 Segmental and somatic dysfunction of thoracic region: Secondary | ICD-10-CM | POA: Diagnosis not present

## 2020-02-23 DIAGNOSIS — M25512 Pain in left shoulder: Secondary | ICD-10-CM | POA: Diagnosis not present

## 2020-02-24 ENCOUNTER — Telehealth (INDEPENDENT_AMBULATORY_CARE_PROVIDER_SITE_OTHER): Payer: Medicare Other | Admitting: Physician Assistant

## 2020-02-24 ENCOUNTER — Encounter: Payer: Self-pay | Admitting: Physician Assistant

## 2020-02-24 VITALS — Ht 65.0 in | Wt 220.0 lb

## 2020-02-24 DIAGNOSIS — H01001 Unspecified blepharitis right upper eyelid: Secondary | ICD-10-CM

## 2020-02-24 MED ORDER — POLYMYXIN B-TRIMETHOPRIM 10000-0.1 UNIT/ML-% OP SOLN: 1.0000 [drp] | OPHTHALMIC | 0 refills | Status: DC

## 2020-02-24 NOTE — Progress Notes (Signed)
Patient ID: Amber Rice, female   DOB: Dec 14, 1955, 64 y.o.   MRN: [id number] .Marland KitchenVirtual Visit via Telephone Note  I connected with Mikhayla Phillis on 02/24/20 at  2:20 PM EDT by telephone and verified that I am speaking with the correct person using two identifiers.  Location: Patient: home Provider: clinic   I discussed the limitations, risks, security and privacy concerns of performing an evaluation and management service by telephone and the availability of in person appointments. I also discussed with the patient that there may be a patient responsible charge related to this service. The patient expressed understanding and agreed to proceed.   History of Present Illness: Patient is a 64 year old female who calls into the clinic with right eye pain, redness, swelling that started last night around 6 PM.  She first noticed some watery discharge coming out of the corners of her right eye.  She noticed her eye felt a little swollen.  She woke up in the morning and there was some crusting over her eye and the discharge was a little greener.  Her eye seemed more swollen and tender this morning.  She denies any injury to the eye.  She denies any vision changes.  She denies any fever, chills, sinus pressure, ear pain. No other sick contacts. There is some redness in the whites of her eye as well. She has done nothing to make better or worse. Continues to get worse.   .. Active Ambulatory Problems    Diagnosis Date Noted  . Hyperlipidemia 07/09/2013  . GERD (gastroesophageal reflux disease) 07/09/2013  . DDD (degenerative disc disease), lumbosacral 07/09/2013  . Bipolar 1 disorder ([place]) 07/09/2013  . Osteoarthritis of left glenohumeral joint 04/30/2014  . Left shoulder pain 03/01/2015  . Bilateral carpal tunnel syndrome 05/03/2015  . DDD (degenerative disc disease), cervical 05/03/2015  . Lumbar disc herniation with radiculopathy 05/03/2015  . Chronic SI joint pain 09/21/2015  . Leukocytosis  01/14/2016  . Acquired hypothyroidism 01/14/2016  . Callus 05/08/2016  . Tobacco abuse 10/12/2016  . Colon polyp 01/04/2018  . BMI 35.0-35.9,adult 12/19/2018  . Myalgia 06/23/2019  . Chronic cough 07/23/2019  . Arthralgia 07/23/2019  . Glossitis 08/14/2019  . Primary osteoarthritis of right ankle 08/21/2019  . Bilateral lower extremity edema 10/22/2019  . Elevated blood pressure reading 10/24/2019  . Proteinuria 10/24/2019  . Chronic bilateral low back pain with bilateral sciatica 11/11/2019  . Weakness 12/22/2019  . Fatigue 12/22/2019  . Chronic venous stasis 01/19/2020   Resolved Ambulatory Problems    Diagnosis Date Noted  . Low back pain with sciatica 07/09/2013  . Impacted cerumen of left ear 04/23/2017   Past Medical History:  Diagnosis Date  . Degenerative disc disease, lumbar    Reviewed med, allergy, problem list.    Observations/Objective: No acute distress. Pt explains upper right eyelid red, swollen, tender to touch. Conjunctiva injected. Watery to green discharge.   Assessment and Plan: Marland KitchenMarland KitchenAngelisa was seen today for eye problem.  Diagnoses and all orders for this visit:  Blepharitis of right upper eyelid, unspecified type -     trimethoprim-polymyxin b (POLYTRIM) ophthalmic solution; Place 1 drop into the left eye every 4 (four) hours. For 7 days.   Per pt whole upper eyelid is red and swollen and tender sounds like blepharitis. Warm compress, gentle lid scrub, poly trim drops sent to pharmacy. Follow up with any worsening pain/vision changes/rash.    Follow Up Instructions:    I discussed the assessment and treatment plan with  the patient. The patient was provided an opportunity to ask questions and all were answered. The patient agreed with the plan and demonstrated an understanding of the instructions.   The patient was advised to call back or seek an in-person evaluation if the symptoms worsen or if the condition fails to improve as  anticipated.  I provided 10 minutes of non-face-to-face time during this encounter.   Iran Planas, PA-C

## 2020-02-24 NOTE — Patient Instructions (Signed)
Blepharitis Blepharitis is swelling of the eyelids. Symptoms may include:  Reddish, scaly skin around the scalp and eyebrows.  Burning or itching of the eyelids.  Fluid coming from the eye at night. This causes the eyelashes to stick together in the morning.  Eyelashes that fall out.  Being sensitive to light. Follow these instructions at home: Pay attention to any changes in how you look or feel. Tell your health care provider about any changes. Follow these instructions to help with your condition: Keeping clean   Wash your hands often.  Wash your eyelids with warm water, or wash them with warm water that is mixed with little bit of baby shampoo. Do this 2 or more times per day.  Wash your face and eyebrows at least once a day.  Use a clean towel each time you dry your eyelids. Do not use the towel to clean or dry other areas of your body. Do not share your towel with anyone. General instructions  Avoid wearing makeup until you get better. Do not share makeup with anyone.  Avoid rubbing your eyes.  Put a warm compress on your eyes 2 times per day for 10 minutes at a time, or as told by your doctor.  If you were given antibiotics in the form of creams or eye drops, use the medicine as told by your doctor. Do not stop using the medicine even if you feel better.  Keep all follow-up visits as told by your doctor. This is important. Contact a doctor if:  Your eyelids feel hot.  You have blisters on your eyelids.  You have a rash on your eyelids.  The swelling does not go away in 2-4 days.  The swelling gets worse. Get help right away if:  You have pain that gets worse.  You have pain that spreads to other parts of your face.  You have redness that gets worse.  You have redness that spreads to other parts of your face.  Your vision changes.  You have pain when you look at lights or things that move.  You have a fever. Summary  Blepharitis is swelling of the  eyelids.  Pay attention to any changes in how your eyes look or feel. Tell your doctor about any changes.  Follow home care instructions as told by your doctor. Wash your hands often. Avoid wearing makeup. Do not rub your eyes.  Use warm compresses, creams, or eye drops as told by your doctor.  Let your doctor know if you have changes in vision, blisters or rash on eyelids, pain that spreads to your face, or warmth on your eyelids. This information is not intended to replace advice given to you by your health care provider. Make sure you discuss any questions you have with your health care provider. Document Revised: 01/21/2018 Document Reviewed: 01/21/2018 Elsevier Patient Education  [employer].

## 2020-02-24 NOTE — Progress Notes (Signed)
Started last night around 6 pm Right eye - clear mucous in corners Swollen No vision changes No injuries Hurts when you touch it Couldn't make it in the the office

## 2020-02-25 DIAGNOSIS — M9901 Segmental and somatic dysfunction of cervical region: Secondary | ICD-10-CM | POA: Diagnosis not present

## 2020-02-25 DIAGNOSIS — M5137 Other intervertebral disc degeneration, lumbosacral region: Secondary | ICD-10-CM | POA: Diagnosis not present

## 2020-02-25 DIAGNOSIS — M9903 Segmental and somatic dysfunction of lumbar region: Secondary | ICD-10-CM | POA: Diagnosis not present

## 2020-02-25 DIAGNOSIS — M9902 Segmental and somatic dysfunction of thoracic region: Secondary | ICD-10-CM | POA: Diagnosis not present

## 2020-02-26 DIAGNOSIS — M9902 Segmental and somatic dysfunction of thoracic region: Secondary | ICD-10-CM | POA: Diagnosis not present

## 2020-02-26 DIAGNOSIS — M5137 Other intervertebral disc degeneration, lumbosacral region: Secondary | ICD-10-CM | POA: Diagnosis not present

## 2020-02-26 DIAGNOSIS — M9901 Segmental and somatic dysfunction of cervical region: Secondary | ICD-10-CM | POA: Diagnosis not present

## 2020-02-26 DIAGNOSIS — M9903 Segmental and somatic dysfunction of lumbar region: Secondary | ICD-10-CM | POA: Diagnosis not present

## 2020-03-15 DIAGNOSIS — M25512 Pain in left shoulder: Secondary | ICD-10-CM | POA: Diagnosis not present

## 2020-03-16 ENCOUNTER — Other Ambulatory Visit: Payer: Self-pay | Admitting: Physician Assistant

## 2020-03-16 DIAGNOSIS — Z1231 Encounter for screening mammogram for malignant neoplasm of breast: Secondary | ICD-10-CM

## 2020-03-26 DIAGNOSIS — M79671 Pain in right foot: Secondary | ICD-10-CM | POA: Diagnosis not present

## 2020-03-29 ENCOUNTER — Encounter (HOSPITAL_COMMUNITY): Payer: Self-pay | Admitting: Psychiatry

## 2020-03-29 ENCOUNTER — Telehealth (INDEPENDENT_AMBULATORY_CARE_PROVIDER_SITE_OTHER): Payer: Medicare Other | Admitting: Psychiatry

## 2020-03-29 DIAGNOSIS — G259 Extrapyramidal and movement disorder, unspecified: Secondary | ICD-10-CM

## 2020-03-29 DIAGNOSIS — G8929 Other chronic pain: Secondary | ICD-10-CM | POA: Diagnosis not present

## 2020-03-29 DIAGNOSIS — F315 Bipolar disorder, current episode depressed, severe, with psychotic features: Secondary | ICD-10-CM | POA: Diagnosis not present

## 2020-03-29 MED ORDER — LITHIUM CARBONATE ER 300 MG PO TBCR: 300.0000 mg | EXTENDED_RELEASE_TABLET | Freq: Two times a day (BID) | ORAL | 0 refills | Status: DC

## 2020-03-29 MED ORDER — SERTRALINE HCL 100 MG PO TABS: 100.0000 mg | ORAL_TABLET | Freq: Two times a day (BID) | ORAL | 0 refills | Status: DC

## 2020-03-29 NOTE — Progress Notes (Signed)
Patient ID: Amber Rice, female   DOB: [birth year], 64 y.o.   MRN: [id number] Amber Rice [id number] 64 y.o.  03/29/2020  Chief Complaint: Follow up bipolar disorder. Med review      I connected with Amber Rice on 03/29/20 at  3:15 PM EDT by telephone and verified that I am speaking with the correct person using two identifiers.   I discussed the limitations, risks, security and privacy concerns of performing an evaluation and management service by telephone and the availability of in person appointments. I also discussed with the patient that there may be a patient responsible charge related to this service. The patient expressed understanding and agreed to proceed.  Patient location home  Provider location home office  History of Present Illness:   Diagnosed with bipolar disorder depressed. Anxiety disorder NOS. Insomnia  Has back pain and follows up with Dr. Georgina Rice or Amber Rice.   Worries related to shoulder pain and may have to repeat replacement surgery , that worries her, following with providers  Lithium level last reported 0.5   Niece diagnosed with lymphoma is a stress    no paranoia  Duration of bipolar :12 plus years  Aggravating factor back condition Depression scale 7/10 . 10 being no depression  Modifying factors; walking when she can . Reading books   Past Medical History:  Diagnosis Date   Degenerative disc disease, lumbar    diagnosed in 2005   Hyperlipidemia    Impacted cerumen of left ear 04/23/2017   Family History  Problem Relation Age of Onset   Cancer Mother    Heart attack Father    Hyperlipidemia Father    Cancer Maternal Grandmother    Cancer Paternal Grandmother    Bipolar disorder Paternal Uncle        Committed suicide in his 52's   Dementia Paternal Uncle    Depression Cousin    Schizophrenia Cousin    Multiple sclerosis Sister    Breast cancer Cousin     Alcohol abuse Neg Hx    Anxiety disorder Neg Hx     Outpatient Encounter Medications as of 03/29/2020  Medication Sig   cyclobenzaprine (FLEXERIL) 10 MG tablet Take 1 tablet (10 mg total) by mouth 3 (three) times daily as needed for muscle spasms.   furosemide (LASIX) 20 MG tablet Take as needed once daily for lower extremity swelling.   HYDROcodone-acetaminophen (NORCO/VICODIN) 5-325 MG tablet Take 1 tablet by mouth every 6 (six) hours as needed for moderate pain.   lithium carbonate (LITHOBID) 300 MG CR tablet Take 1 tablet (300 mg total) by mouth 2 (two) times daily.   sertraline (ZOLOFT) 100 MG tablet Take 1 tablet (100 mg total) by mouth 2 (two) times daily.   trimethoprim-polymyxin b (POLYTRIM) ophthalmic solution Place 1 drop into the left eye every 4 (four) hours. For 7 days.   [DISCONTINUED] lithium carbonate (LITHOBID) 300 MG CR tablet TAKE 1 TABLET (300 MG TOTAL) BY MOUTH 2 (TWO) TIMES DAILY.   [DISCONTINUED] sertraline (ZOLOFT) 100 MG tablet TAKE 1 TABLET BY MOUTH TWICE A DAY   No facility-administered encounter medications on file as of 03/29/2020.    No results found for this or any previous visit (from the past 2160 hour(s)).  There were no vitals taken for this visit.   Review of Systems  Cardiovascular: Negative for chest pain.  Musculoskeletal: Positive for back pain.  Psychiatric/Behavioral: Negative for substance abuse and suicidal ideas.  Mental Status Examination  Appearance:  Alert: Yes Attention: fair  Cooperative: Yes Eye Contact:  Speech: normal Psychomotor Activity: Decreased Memory/Concentration: adequate Oriented: person, place, time/date and situation Mood: fair Affect: congruent Thought Processes and Associations: Coherent no paranoia as of now [employer] of Knowledge: Fair Thought Content: Suicidal ideation and Homicidal ideation were denied Insight: Fair Judgement: Fair  Diagnosis: Bipolar disorder type I manic per history for  psychotic features. Insomnia NOS. Adjustment disorder with anxiety . EPS Treatment Plan:   1. Bipolar: depression  Doing fair, continue lithium, will request levels prior next visit  2. Depression: manageable, continue zoloft  3. Insomnia:irregular sleep hygiene reviewed. Back condition can effect sleep. Is on meds  Fu 3-20m.  I discussed the assessment and treatment plan with the patient. The patient was provided an opportunity to ask questions and all were answered. The patient agreed with the plan and demonstrated an understanding of the instructions.   The patient was advised to call back or seek an in-person evaluation if the symptoms worsen or if the condition fails to improve as anticipated.  I provided 15  minutes of non-face-to-face time during this encounter. Fu 38m.  Amber Capron, MD

## 2020-03-31 DIAGNOSIS — M25512 Pain in left shoulder: Secondary | ICD-10-CM | POA: Diagnosis not present

## 2020-03-31 DIAGNOSIS — F315 Bipolar disorder, current episode depressed, severe, with psychotic features: Secondary | ICD-10-CM | POA: Diagnosis not present

## 2020-04-01 LAB — LITHIUM LEVEL: Lithium Lvl: 0.5 mmol/L — ABNORMAL LOW (ref 0.6–1.2)

## 2020-04-08 DIAGNOSIS — Z471 Aftercare following joint replacement surgery: Secondary | ICD-10-CM | POA: Diagnosis not present

## 2020-04-08 DIAGNOSIS — M25512 Pain in left shoulder: Secondary | ICD-10-CM | POA: Diagnosis not present

## 2020-04-08 DIAGNOSIS — Z96612 Presence of left artificial shoulder joint: Secondary | ICD-10-CM | POA: Diagnosis not present

## 2020-04-14 ENCOUNTER — Telehealth (INDEPENDENT_AMBULATORY_CARE_PROVIDER_SITE_OTHER): Payer: Medicare Other | Admitting: Physician Assistant

## 2020-04-14 ENCOUNTER — Ambulatory Visit: Payer: Medicare Other

## 2020-04-14 VITALS — Ht 65.0 in | Wt 220.0 lb

## 2020-04-14 DIAGNOSIS — G2581 Restless legs syndrome: Secondary | ICD-10-CM

## 2020-04-14 MED ORDER — ROPINIROLE HCL 0.25 MG PO TABS: ORAL_TABLET | ORAL | 1 refills | Status: DC

## 2020-04-14 NOTE — Progress Notes (Signed)
Issues with legs at night Happening since December, worse the last two days Has been up feeling like she has to move them, using ice packs Feels like something is "crawling" in her leg

## 2020-04-14 NOTE — Patient Instructions (Signed)

## 2020-04-16 ENCOUNTER — Encounter: Payer: Self-pay | Admitting: Physician Assistant

## 2020-04-16 DIAGNOSIS — M25512 Pain in left shoulder: Secondary | ICD-10-CM | POA: Diagnosis not present

## 2020-04-16 DIAGNOSIS — G2581 Restless legs syndrome: Secondary | ICD-10-CM | POA: Insufficient documentation

## 2020-04-16 NOTE — Progress Notes (Signed)
Patient ID: Amber Rice, female   DOB: April 01, 1956, 64 y.o.   MRN: [id number] .Marland KitchenVirtual Visit via Telephone Note  I connected with Amber Rice on 04/14/2020 at  1:00 PM EDT by telephone and verified that I am speaking with the correct person using two identifiers.  Location: Patient: home Provider: clinic   I discussed the limitations, risks, security and privacy concerns of performing an evaluation and management service by telephone and the availability of in person appointments. I also discussed with the patient that there may be a patient responsible charge related to this service. The patient expressed understanding and agreed to proceed.   History of Present Illness: Pt is a 64 yo female who calls into the clinic with bilateral legs crawling at night. This only happens at night when she lays down. She feels like she "needs to move them". Ice packs help some. Going on for 9 months. No pain in legs or worsening pain in low back. She has not tried any medication. No new medications started.   .. Active Ambulatory Problems    Diagnosis Date Noted  . Hyperlipidemia 07/09/2013  . GERD (gastroesophageal reflux disease) 07/09/2013  . DDD (degenerative disc disease), lumbosacral 07/09/2013  . Bipolar 1 disorder ([place]) 07/09/2013  . Osteoarthritis of left glenohumeral joint 04/30/2014  . Left shoulder pain 03/01/2015  . Bilateral carpal tunnel syndrome 05/03/2015  . DDD (degenerative disc disease), cervical 05/03/2015  . Lumbar disc herniation with radiculopathy 05/03/2015  . Chronic SI joint pain 09/21/2015  . Leukocytosis 01/14/2016  . Acquired hypothyroidism 01/14/2016  . Callus 05/08/2016  . Tobacco abuse 10/12/2016  . Colon polyp 01/04/2018  . BMI 35.0-35.9,adult 12/19/2018  . Myalgia 06/23/2019  . Chronic cough 07/23/2019  . Arthralgia 07/23/2019  . Glossitis 08/14/2019  . Primary osteoarthritis of right ankle 08/21/2019  . Bilateral lower extremity edema 10/22/2019  .  Elevated blood pressure reading 10/24/2019  . Proteinuria 10/24/2019  . Chronic bilateral low back pain with bilateral sciatica 11/11/2019  . Weakness 12/22/2019  . Fatigue 12/22/2019  . Chronic venous stasis 01/19/2020  . RLS (restless legs syndrome) 04/16/2020   Resolved Ambulatory Problems    Diagnosis Date Noted  . Low back pain with sciatica 07/09/2013  . Impacted cerumen of left ear 04/23/2017   Past Medical History:  Diagnosis Date  . Degenerative disc disease, lumbar    Reviewed med, allergy, problem list.     Observations/Objective: No acute distress  .Marland Kitchen Today's Vitals   04/14/20 1157  Weight: 220 lb (99.8 kg)  Height: 5\' 5"  (1.651 m)   Body mass index is 36.61 kg/m.    Assessment and Plan: Marland KitchenMarland KitchenLaveda was seen today for leg problem.  Diagnoses and all orders for this visit:  RLS (restless legs syndrome) -     rOPINIRole (REQUIP) 0.25 MG tablet; Take 1-4 tablets by mouth 1 hour before bed for restless leg.   Sounds like RLS. Last CBC hemoglobin looked good. Discussed massage of legs, increasing iron in diet, good rest. Trial of requip. If no improvement follow up for labs and more evaluation.    Follow Up Instructions:    I discussed the assessment and treatment plan with the patient. The patient was provided an opportunity to ask questions and all were answered. The patient agreed with the plan and demonstrated an understanding of the instructions.   The patient was advised to call back or seek an in-person evaluation if the symptoms worsen or if the condition fails to improve as anticipated.  I provided 7 minutes of non-face-to-face time during this encounter.   Iran Planas, PA-C

## 2020-04-21 ENCOUNTER — Other Ambulatory Visit: Payer: Self-pay | Admitting: Physician Assistant

## 2020-04-21 DIAGNOSIS — M5137 Other intervertebral disc degeneration, lumbosacral region: Secondary | ICD-10-CM

## 2020-04-21 DIAGNOSIS — M5442 Lumbago with sciatica, left side: Secondary | ICD-10-CM

## 2020-04-21 DIAGNOSIS — G8929 Other chronic pain: Secondary | ICD-10-CM

## 2020-04-23 ENCOUNTER — Other Ambulatory Visit: Payer: Self-pay | Admitting: Physician Assistant

## 2020-04-23 DIAGNOSIS — G8929 Other chronic pain: Secondary | ICD-10-CM

## 2020-04-23 DIAGNOSIS — M5137 Other intervertebral disc degeneration, lumbosacral region: Secondary | ICD-10-CM

## 2020-04-23 DIAGNOSIS — M5442 Lumbago with sciatica, left side: Secondary | ICD-10-CM

## 2020-04-23 NOTE — Telephone Encounter (Signed)
Left message on machine for patient to call back about medication requesting and scheduling an appt for pain contract. Please deny medication for now.

## 2020-04-23 NOTE — Telephone Encounter (Signed)
Last refilled on 02/16/20 #20. Video visit on 04/14/2020.  Please refill if appropriate.

## 2020-04-23 NOTE — Telephone Encounter (Signed)
I viewed the controlled substance database and she is getting norco small quanities from a lot of people. We are going to need to put her on a pain contract and advise her only to get from one provider. Can she come in to sign pain contract?

## 2020-04-26 NOTE — Telephone Encounter (Signed)
Received a vm from patient asking for pain medication. Left another message letting her know an in office appt to sign pain contract would be required prior to [facility] writing any pain medication.

## 2020-05-05 ENCOUNTER — Other Ambulatory Visit: Payer: Self-pay

## 2020-05-05 ENCOUNTER — Encounter: Payer: Self-pay | Admitting: Physician Assistant

## 2020-05-05 ENCOUNTER — Ambulatory Visit (INDEPENDENT_AMBULATORY_CARE_PROVIDER_SITE_OTHER): Payer: Medicare Other

## 2020-05-05 ENCOUNTER — Ambulatory Visit (INDEPENDENT_AMBULATORY_CARE_PROVIDER_SITE_OTHER): Payer: Medicare Other | Admitting: Physician Assistant

## 2020-05-05 VITALS — BP 134/87 | HR 101 | Ht 65.0 in | Wt 244.0 lb

## 2020-05-05 DIAGNOSIS — M5441 Lumbago with sciatica, right side: Secondary | ICD-10-CM

## 2020-05-05 DIAGNOSIS — M5030 Other cervical disc degeneration, unspecified cervical region: Secondary | ICD-10-CM | POA: Diagnosis not present

## 2020-05-05 DIAGNOSIS — M5442 Lumbago with sciatica, left side: Secondary | ICD-10-CM

## 2020-05-05 DIAGNOSIS — M5137 Other intervertebral disc degeneration, lumbosacral region: Secondary | ICD-10-CM

## 2020-05-05 DIAGNOSIS — M25512 Pain in left shoulder: Secondary | ICD-10-CM | POA: Diagnosis not present

## 2020-05-05 DIAGNOSIS — G8929 Other chronic pain: Secondary | ICD-10-CM

## 2020-05-05 DIAGNOSIS — Z1231 Encounter for screening mammogram for malignant neoplasm of breast: Secondary | ICD-10-CM

## 2020-05-05 DIAGNOSIS — G894 Chronic pain syndrome: Secondary | ICD-10-CM | POA: Insufficient documentation

## 2020-05-05 DIAGNOSIS — M19071 Primary osteoarthritis, right ankle and foot: Secondary | ICD-10-CM | POA: Diagnosis not present

## 2020-05-05 DIAGNOSIS — Z96612 Presence of left artificial shoulder joint: Secondary | ICD-10-CM | POA: Diagnosis not present

## 2020-05-05 DIAGNOSIS — M5116 Intervertebral disc disorders with radiculopathy, lumbar region: Secondary | ICD-10-CM | POA: Diagnosis not present

## 2020-05-05 DIAGNOSIS — M19012 Primary osteoarthritis, left shoulder: Secondary | ICD-10-CM | POA: Diagnosis not present

## 2020-05-05 MED ORDER — HYDROCODONE-ACETAMINOPHEN 5-325 MG PO TABS: 1.0000 | ORAL_TABLET | Freq: Two times a day (BID) | ORAL | 0 refills | Status: DC | PRN

## 2020-05-05 NOTE — Progress Notes (Signed)
Subjective:    Patient ID: Amber Rice, female    DOB: 1955/10/18, 64 y.o.   MRN: [id number]  HPI  Patient is a 64 year old obese female with cervical/lumbar/thoracic degenerative disc disease, SI joint dysfunction, left shoulder osteoarthritis who presents to the clinic and chronic daily pain.  She has been getting Norco as needed.  She has had approximately 97 Norco given to her this year.  She reports that maybe she has been in pain 200 other days.  She had her left shoulder drained this morning and likely is going to have to have surgery.  She really just wants some pain relief.  She is here to sign a pain contract. .. Active Ambulatory Problems    Diagnosis Date Noted  . Hyperlipidemia 07/09/2013  . GERD (gastroesophageal reflux disease) 07/09/2013  . DDD (degenerative disc disease), lumbosacral 07/09/2013  . Bipolar 1 disorder ([place]) 07/09/2013  . Osteoarthritis of left glenohumeral joint 04/30/2014  . Left shoulder pain 03/01/2015  . Bilateral carpal tunnel syndrome 05/03/2015  . DDD (degenerative disc disease), cervical 05/03/2015  . Lumbar disc herniation with radiculopathy 05/03/2015  . Chronic SI joint pain 09/21/2015  . Leukocytosis 01/14/2016  . Acquired hypothyroidism 01/14/2016  . Callus 05/08/2016  . Tobacco abuse 10/12/2016  . Colon polyp 01/04/2018  . BMI 35.0-35.9,adult 12/19/2018  . Myalgia 06/23/2019  . Chronic cough 07/23/2019  . Arthralgia 07/23/2019  . Glossitis 08/14/2019  . Primary osteoarthritis of right ankle 08/21/2019  . Bilateral lower extremity edema 10/22/2019  . Elevated blood pressure reading 10/24/2019  . Proteinuria 10/24/2019  . Chronic bilateral low back pain with bilateral sciatica 11/11/2019  . Weakness 12/22/2019  . Fatigue 12/22/2019  . Chronic venous stasis 01/19/2020  . RLS (restless legs syndrome) 04/16/2020  . Chronic pain syndrome 05/05/2020   Resolved Ambulatory Problems    Diagnosis Date Noted  . Low back pain with  sciatica 07/09/2013  . Impacted cerumen of left ear 04/23/2017   Past Medical History:  Diagnosis Date  . Degenerative disc disease, lumbar      Review of Systems  All other systems reviewed and are negative.      Objective:   Physical Exam Vitals reviewed.  Constitutional:      Appearance: Normal appearance. She is obese.  HENT:     Head: Normocephalic.  Cardiovascular:     Rate and Rhythm: Normal rate.  Neurological:     General: No focal deficit present.     Mental Status: She is alert and oriented to person, place, and time.  Psychiatric:        Mood and Affect: Mood normal.           Assessment & Plan:  Marland KitchenMarland KitchenBlessing was seen today for pain.  Diagnoses and all orders for this visit:  Chronic pain syndrome  DDD (degenerative disc disease), lumbosacral -     DRUG MONITORING, PANEL 6 WITH CONFIRMATION, URINE -     HYDROcodone-acetaminophen (NORCO/VICODIN) 5-325 MG tablet; Take 1 tablet by mouth every 12 (twelve) hours as needed for moderate pain.  DDD (degenerative disc disease), cervical -     DRUG MONITORING, PANEL 6 WITH CONFIRMATION, URINE  Osteoarthritis of left glenohumeral joint  Primary osteoarthritis of right ankle  Chronic bilateral low back pain with bilateral sciatica -     DRUG MONITORING, PANEL 6 WITH CONFIRMATION, URINE -     HYDROcodone-acetaminophen (NORCO/VICODIN) 5-325 MG tablet; Take 1 tablet by mouth every 12 (twelve) hours as needed for moderate pain.  Lumbar disc herniation with radiculopathy -     DRUG MONITORING, PANEL 6 WITH CONFIRMATION, URINE   .Marland KitchenPDMP reviewed during this encounter. Last rx refill was 03/15/2020 10 tablets.  Opoid Risk score  .Marland Kitchen  Opioid Risk Tool - 05/05/20 1300      Psychological Disease   Psychological Disease Positive    Bipolar --   female   Depression Positive      Total Score   Opioid Risk Tool Scoring 3    Opioid Risk Interpretation Low Risk           UDS ordered Pain contract reviewed  and signed.  norco 5/325mg  up to twice a day  Follow up in 3 months.

## 2020-05-05 NOTE — Assessment & Plan Note (Signed)
Opioid risk 3

## 2020-05-06 ENCOUNTER — Other Ambulatory Visit: Payer: Self-pay | Admitting: Physician Assistant

## 2020-05-06 DIAGNOSIS — G2581 Restless legs syndrome: Secondary | ICD-10-CM

## 2020-05-06 LAB — DRUG MONITORING, PANEL 6 WITH CONFIRMATION, URINE
6 Acetylmorphine: NEGATIVE ng/mL (ref <10)
Alcohol Metabolites: NEGATIVE ng/mL
Amphetamines: NEGATIVE ng/mL (ref <500)
Barbiturates: NEGATIVE ng/mL (ref <300)
Benzodiazepines: NEGATIVE ng/mL (ref <100)
Cocaine Metabolite: NEGATIVE ng/mL (ref <150)
Creatinine: 30.3 mg/dL
Marijuana Metabolite: NEGATIVE ng/mL (ref <20)
Methadone Metabolite: NEGATIVE ng/mL (ref <100)
Opiates: NEGATIVE ng/mL (ref <100)
Oxidant: NEGATIVE ug/mL
Oxycodone: NEGATIVE ng/mL (ref <100)
Phencyclidine: NEGATIVE ng/mL (ref <25)
pH: 6.4 (ref 4.5–9.0)

## 2020-05-06 LAB — DM TEMPLATE

## 2020-05-07 NOTE — Progress Notes (Signed)
Normal mammogram. Follow up in 1 year.

## 2020-05-17 DIAGNOSIS — M25512 Pain in left shoulder: Secondary | ICD-10-CM | POA: Diagnosis not present

## 2020-05-25 DIAGNOSIS — M19012 Primary osteoarthritis, left shoulder: Secondary | ICD-10-CM | POA: Diagnosis not present

## 2020-05-25 DIAGNOSIS — E039 Hypothyroidism, unspecified: Secondary | ICD-10-CM | POA: Diagnosis not present

## 2020-05-25 DIAGNOSIS — E785 Hyperlipidemia, unspecified: Secondary | ICD-10-CM | POA: Diagnosis not present

## 2020-05-25 DIAGNOSIS — M25512 Pain in left shoulder: Secondary | ICD-10-CM | POA: Diagnosis not present

## 2020-05-25 DIAGNOSIS — Z01812 Encounter for preprocedural laboratory examination: Secondary | ICD-10-CM | POA: Diagnosis not present

## 2020-05-25 DIAGNOSIS — Z79899 Other long term (current) drug therapy: Secondary | ICD-10-CM | POA: Diagnosis not present

## 2020-05-25 DIAGNOSIS — Z01818 Encounter for other preprocedural examination: Secondary | ICD-10-CM | POA: Diagnosis not present

## 2020-05-25 DIAGNOSIS — F17200 Nicotine dependence, unspecified, uncomplicated: Secondary | ICD-10-CM | POA: Diagnosis not present

## 2020-06-01 ENCOUNTER — Other Ambulatory Visit: Payer: Self-pay | Admitting: Physician Assistant

## 2020-06-01 DIAGNOSIS — G2581 Restless legs syndrome: Secondary | ICD-10-CM

## 2020-06-03 DIAGNOSIS — F17210 Nicotine dependence, cigarettes, uncomplicated: Secondary | ICD-10-CM | POA: Diagnosis not present

## 2020-06-03 DIAGNOSIS — M25512 Pain in left shoulder: Secondary | ICD-10-CM | POA: Diagnosis not present

## 2020-06-03 DIAGNOSIS — Z96612 Presence of left artificial shoulder joint: Secondary | ICD-10-CM | POA: Diagnosis not present

## 2020-06-03 DIAGNOSIS — Z471 Aftercare following joint replacement surgery: Secondary | ICD-10-CM | POA: Diagnosis not present

## 2020-06-03 DIAGNOSIS — Z888 Allergy status to other drugs, medicaments and biological substances status: Secondary | ICD-10-CM | POA: Diagnosis not present

## 2020-06-03 DIAGNOSIS — F319 Bipolar disorder, unspecified: Secondary | ICD-10-CM | POA: Diagnosis not present

## 2020-06-03 DIAGNOSIS — Z6841 Body Mass Index (BMI) 40.0 and over, adult: Secondary | ICD-10-CM | POA: Diagnosis not present

## 2020-06-03 DIAGNOSIS — E039 Hypothyroidism, unspecified: Secondary | ICD-10-CM | POA: Diagnosis not present

## 2020-06-03 DIAGNOSIS — E669 Obesity, unspecified: Secondary | ICD-10-CM | POA: Diagnosis not present

## 2020-06-03 DIAGNOSIS — G8918 Other acute postprocedural pain: Secondary | ICD-10-CM | POA: Diagnosis not present

## 2020-06-03 DIAGNOSIS — T84098A Other mechanical complication of other internal joint prosthesis, initial encounter: Secondary | ICD-10-CM | POA: Diagnosis not present

## 2020-06-03 DIAGNOSIS — M5416 Radiculopathy, lumbar region: Secondary | ICD-10-CM | POA: Diagnosis not present

## 2020-06-03 DIAGNOSIS — E785 Hyperlipidemia, unspecified: Secondary | ICD-10-CM | POA: Diagnosis not present

## 2020-06-03 DIAGNOSIS — M19012 Primary osteoarthritis, left shoulder: Secondary | ICD-10-CM | POA: Diagnosis not present

## 2020-06-03 DIAGNOSIS — Z79899 Other long term (current) drug therapy: Secondary | ICD-10-CM | POA: Diagnosis not present

## 2020-06-03 DIAGNOSIS — F17200 Nicotine dependence, unspecified, uncomplicated: Secondary | ICD-10-CM | POA: Diagnosis not present

## 2020-06-04 DIAGNOSIS — E039 Hypothyroidism, unspecified: Secondary | ICD-10-CM | POA: Diagnosis not present

## 2020-06-04 DIAGNOSIS — F17210 Nicotine dependence, cigarettes, uncomplicated: Secondary | ICD-10-CM | POA: Diagnosis not present

## 2020-06-04 DIAGNOSIS — M19012 Primary osteoarthritis, left shoulder: Secondary | ICD-10-CM | POA: Diagnosis not present

## 2020-06-04 DIAGNOSIS — E785 Hyperlipidemia, unspecified: Secondary | ICD-10-CM | POA: Diagnosis not present

## 2020-06-04 DIAGNOSIS — T84098A Other mechanical complication of other internal joint prosthesis, initial encounter: Secondary | ICD-10-CM | POA: Diagnosis not present

## 2020-06-04 DIAGNOSIS — Z96612 Presence of left artificial shoulder joint: Secondary | ICD-10-CM | POA: Diagnosis not present

## 2020-06-05 DIAGNOSIS — F319 Bipolar disorder, unspecified: Secondary | ICD-10-CM | POA: Diagnosis not present

## 2020-06-05 DIAGNOSIS — Z471 Aftercare following joint replacement surgery: Secondary | ICD-10-CM | POA: Diagnosis not present

## 2020-06-05 DIAGNOSIS — Z96612 Presence of left artificial shoulder joint: Secondary | ICD-10-CM | POA: Diagnosis not present

## 2020-06-05 DIAGNOSIS — M6281 Muscle weakness (generalized): Secondary | ICD-10-CM | POA: Diagnosis not present

## 2020-06-05 DIAGNOSIS — Z6841 Body Mass Index (BMI) 40.0 and over, adult: Secondary | ICD-10-CM | POA: Diagnosis not present

## 2020-06-09 DIAGNOSIS — M6281 Muscle weakness (generalized): Secondary | ICD-10-CM | POA: Diagnosis not present

## 2020-06-09 DIAGNOSIS — F319 Bipolar disorder, unspecified: Secondary | ICD-10-CM | POA: Diagnosis not present

## 2020-06-09 DIAGNOSIS — Z471 Aftercare following joint replacement surgery: Secondary | ICD-10-CM | POA: Diagnosis not present

## 2020-06-09 DIAGNOSIS — Z6841 Body Mass Index (BMI) 40.0 and over, adult: Secondary | ICD-10-CM | POA: Diagnosis not present

## 2020-06-09 DIAGNOSIS — Z96612 Presence of left artificial shoulder joint: Secondary | ICD-10-CM | POA: Diagnosis not present

## 2020-06-11 DIAGNOSIS — M6281 Muscle weakness (generalized): Secondary | ICD-10-CM | POA: Diagnosis not present

## 2020-06-11 DIAGNOSIS — Z6841 Body Mass Index (BMI) 40.0 and over, adult: Secondary | ICD-10-CM | POA: Diagnosis not present

## 2020-06-11 DIAGNOSIS — Z471 Aftercare following joint replacement surgery: Secondary | ICD-10-CM | POA: Diagnosis not present

## 2020-06-11 DIAGNOSIS — F319 Bipolar disorder, unspecified: Secondary | ICD-10-CM | POA: Diagnosis not present

## 2020-06-11 DIAGNOSIS — Z96612 Presence of left artificial shoulder joint: Secondary | ICD-10-CM | POA: Diagnosis not present

## 2020-06-15 ENCOUNTER — Other Ambulatory Visit: Payer: Self-pay | Admitting: Physician Assistant

## 2020-06-15 DIAGNOSIS — Z6841 Body Mass Index (BMI) 40.0 and over, adult: Secondary | ICD-10-CM | POA: Diagnosis not present

## 2020-06-15 DIAGNOSIS — Z96612 Presence of left artificial shoulder joint: Secondary | ICD-10-CM | POA: Diagnosis not present

## 2020-06-15 DIAGNOSIS — M5137 Other intervertebral disc degeneration, lumbosacral region: Secondary | ICD-10-CM

## 2020-06-15 DIAGNOSIS — G8929 Other chronic pain: Secondary | ICD-10-CM

## 2020-06-15 DIAGNOSIS — Z471 Aftercare following joint replacement surgery: Secondary | ICD-10-CM | POA: Diagnosis not present

## 2020-06-15 DIAGNOSIS — F319 Bipolar disorder, unspecified: Secondary | ICD-10-CM | POA: Diagnosis not present

## 2020-06-15 DIAGNOSIS — M6281 Muscle weakness (generalized): Secondary | ICD-10-CM | POA: Diagnosis not present

## 2020-06-15 DIAGNOSIS — M5442 Lumbago with sciatica, left side: Secondary | ICD-10-CM

## 2020-06-16 ENCOUNTER — Other Ambulatory Visit: Payer: Self-pay | Admitting: Physician Assistant

## 2020-06-16 DIAGNOSIS — M25512 Pain in left shoulder: Secondary | ICD-10-CM | POA: Diagnosis not present

## 2020-06-16 DIAGNOSIS — M5137 Other intervertebral disc degeneration, lumbosacral region: Secondary | ICD-10-CM

## 2020-06-16 DIAGNOSIS — M5442 Lumbago with sciatica, left side: Secondary | ICD-10-CM

## 2020-06-16 DIAGNOSIS — G8929 Other chronic pain: Secondary | ICD-10-CM

## 2020-06-16 MED ORDER — HYDROCODONE-ACETAMINOPHEN 5-325 MG PO TABS: 1.0000 | ORAL_TABLET | Freq: Two times a day (BID) | ORAL | 0 refills | Status: DC | PRN

## 2020-06-16 NOTE — Telephone Encounter (Signed)
Last written 05/05/2020 #60 no refills  On pain contract Last seen 05/05/2020

## 2020-06-17 DIAGNOSIS — Z6841 Body Mass Index (BMI) 40.0 and over, adult: Secondary | ICD-10-CM | POA: Diagnosis not present

## 2020-06-17 DIAGNOSIS — F319 Bipolar disorder, unspecified: Secondary | ICD-10-CM | POA: Diagnosis not present

## 2020-06-17 DIAGNOSIS — Z471 Aftercare following joint replacement surgery: Secondary | ICD-10-CM | POA: Diagnosis not present

## 2020-06-17 DIAGNOSIS — M6281 Muscle weakness (generalized): Secondary | ICD-10-CM | POA: Diagnosis not present

## 2020-06-17 DIAGNOSIS — Z96612 Presence of left artificial shoulder joint: Secondary | ICD-10-CM | POA: Diagnosis not present

## 2020-06-17 NOTE — Telephone Encounter (Signed)
Already sent, please sign denial.

## 2020-06-22 ENCOUNTER — Other Ambulatory Visit (HOSPITAL_COMMUNITY): Payer: Self-pay | Admitting: Psychiatry

## 2020-06-23 DIAGNOSIS — M6281 Muscle weakness (generalized): Secondary | ICD-10-CM | POA: Diagnosis not present

## 2020-06-23 DIAGNOSIS — Z6841 Body Mass Index (BMI) 40.0 and over, adult: Secondary | ICD-10-CM | POA: Diagnosis not present

## 2020-06-23 DIAGNOSIS — Z96612 Presence of left artificial shoulder joint: Secondary | ICD-10-CM | POA: Diagnosis not present

## 2020-06-23 DIAGNOSIS — F319 Bipolar disorder, unspecified: Secondary | ICD-10-CM | POA: Diagnosis not present

## 2020-06-23 DIAGNOSIS — Z471 Aftercare following joint replacement surgery: Secondary | ICD-10-CM | POA: Diagnosis not present

## 2020-06-25 DIAGNOSIS — Z471 Aftercare following joint replacement surgery: Secondary | ICD-10-CM | POA: Diagnosis not present

## 2020-06-25 DIAGNOSIS — Z96612 Presence of left artificial shoulder joint: Secondary | ICD-10-CM | POA: Diagnosis not present

## 2020-06-25 DIAGNOSIS — M6281 Muscle weakness (generalized): Secondary | ICD-10-CM | POA: Diagnosis not present

## 2020-06-25 DIAGNOSIS — F319 Bipolar disorder, unspecified: Secondary | ICD-10-CM | POA: Diagnosis not present

## 2020-06-25 DIAGNOSIS — Z6841 Body Mass Index (BMI) 40.0 and over, adult: Secondary | ICD-10-CM | POA: Diagnosis not present

## 2020-06-28 DIAGNOSIS — Z6841 Body Mass Index (BMI) 40.0 and over, adult: Secondary | ICD-10-CM | POA: Diagnosis not present

## 2020-06-28 DIAGNOSIS — F319 Bipolar disorder, unspecified: Secondary | ICD-10-CM | POA: Diagnosis not present

## 2020-06-28 DIAGNOSIS — Z96612 Presence of left artificial shoulder joint: Secondary | ICD-10-CM | POA: Diagnosis not present

## 2020-06-28 DIAGNOSIS — Z471 Aftercare following joint replacement surgery: Secondary | ICD-10-CM | POA: Diagnosis not present

## 2020-06-28 DIAGNOSIS — M6281 Muscle weakness (generalized): Secondary | ICD-10-CM | POA: Diagnosis not present

## 2020-07-05 DIAGNOSIS — M6281 Muscle weakness (generalized): Secondary | ICD-10-CM | POA: Diagnosis not present

## 2020-07-05 DIAGNOSIS — Z6841 Body Mass Index (BMI) 40.0 and over, adult: Secondary | ICD-10-CM | POA: Diagnosis not present

## 2020-07-05 DIAGNOSIS — Z96612 Presence of left artificial shoulder joint: Secondary | ICD-10-CM | POA: Diagnosis not present

## 2020-07-05 DIAGNOSIS — F319 Bipolar disorder, unspecified: Secondary | ICD-10-CM | POA: Diagnosis not present

## 2020-07-05 DIAGNOSIS — Z471 Aftercare following joint replacement surgery: Secondary | ICD-10-CM | POA: Diagnosis not present

## 2020-07-06 DIAGNOSIS — Z471 Aftercare following joint replacement surgery: Secondary | ICD-10-CM | POA: Diagnosis not present

## 2020-07-06 DIAGNOSIS — F319 Bipolar disorder, unspecified: Secondary | ICD-10-CM | POA: Diagnosis not present

## 2020-07-06 DIAGNOSIS — Z96612 Presence of left artificial shoulder joint: Secondary | ICD-10-CM | POA: Diagnosis not present

## 2020-07-06 DIAGNOSIS — M6281 Muscle weakness (generalized): Secondary | ICD-10-CM | POA: Diagnosis not present

## 2020-07-06 DIAGNOSIS — Z6841 Body Mass Index (BMI) 40.0 and over, adult: Secondary | ICD-10-CM | POA: Diagnosis not present

## 2020-07-07 DIAGNOSIS — Z96612 Presence of left artificial shoulder joint: Secondary | ICD-10-CM | POA: Diagnosis not present

## 2020-07-07 DIAGNOSIS — F319 Bipolar disorder, unspecified: Secondary | ICD-10-CM | POA: Diagnosis not present

## 2020-07-07 DIAGNOSIS — M6281 Muscle weakness (generalized): Secondary | ICD-10-CM | POA: Diagnosis not present

## 2020-07-07 DIAGNOSIS — Z471 Aftercare following joint replacement surgery: Secondary | ICD-10-CM | POA: Diagnosis not present

## 2020-07-07 DIAGNOSIS — Z6841 Body Mass Index (BMI) 40.0 and over, adult: Secondary | ICD-10-CM | POA: Diagnosis not present

## 2020-07-08 ENCOUNTER — Other Ambulatory Visit: Payer: Self-pay | Admitting: Neurology

## 2020-07-08 DIAGNOSIS — M5137 Other intervertebral disc degeneration, lumbosacral region: Secondary | ICD-10-CM

## 2020-07-08 DIAGNOSIS — G8929 Other chronic pain: Secondary | ICD-10-CM

## 2020-07-08 NOTE — Telephone Encounter (Signed)
Patient left vm stating she needs to have TSH checked and wants order placed for [facility]. Looks like order already in system for patient to have this checked. Patient made aware.   She is also due for pain medication refill next week. RX pended and post dated for December 9th. On pain contract. Amber Rice - please send.

## 2020-07-09 MED ORDER — HYDROCODONE-ACETAMINOPHEN 5-325 MG PO TABS: 1.0000 | ORAL_TABLET | Freq: Two times a day (BID) | ORAL | 0 refills | Status: DC | PRN

## 2020-07-12 DIAGNOSIS — M6281 Muscle weakness (generalized): Secondary | ICD-10-CM | POA: Diagnosis not present

## 2020-07-12 DIAGNOSIS — Z471 Aftercare following joint replacement surgery: Secondary | ICD-10-CM | POA: Diagnosis not present

## 2020-07-12 DIAGNOSIS — Z96612 Presence of left artificial shoulder joint: Secondary | ICD-10-CM | POA: Diagnosis not present

## 2020-07-12 DIAGNOSIS — Z6841 Body Mass Index (BMI) 40.0 and over, adult: Secondary | ICD-10-CM | POA: Diagnosis not present

## 2020-07-12 DIAGNOSIS — F319 Bipolar disorder, unspecified: Secondary | ICD-10-CM | POA: Diagnosis not present

## 2020-07-15 DIAGNOSIS — Z471 Aftercare following joint replacement surgery: Secondary | ICD-10-CM | POA: Diagnosis not present

## 2020-07-15 DIAGNOSIS — Z6841 Body Mass Index (BMI) 40.0 and over, adult: Secondary | ICD-10-CM | POA: Diagnosis not present

## 2020-07-15 DIAGNOSIS — Z96612 Presence of left artificial shoulder joint: Secondary | ICD-10-CM | POA: Diagnosis not present

## 2020-07-15 DIAGNOSIS — M6281 Muscle weakness (generalized): Secondary | ICD-10-CM | POA: Diagnosis not present

## 2020-07-15 DIAGNOSIS — F319 Bipolar disorder, unspecified: Secondary | ICD-10-CM | POA: Diagnosis not present

## 2020-07-16 DIAGNOSIS — Z79899 Other long term (current) drug therapy: Secondary | ICD-10-CM | POA: Diagnosis not present

## 2020-07-16 DIAGNOSIS — E039 Hypothyroidism, unspecified: Secondary | ICD-10-CM | POA: Diagnosis not present

## 2020-07-17 LAB — LITHIUM LEVEL: Lithium Lvl: 0.5 mmol/L — ABNORMAL LOW (ref 0.6–1.2)

## 2020-07-17 LAB — TSH: TSH: 6.4 mIU/L — ABNORMAL HIGH (ref 0.40–4.50)

## 2020-07-19 ENCOUNTER — Other Ambulatory Visit: Payer: Self-pay | Admitting: Physician Assistant

## 2020-07-19 ENCOUNTER — Encounter: Payer: Self-pay | Admitting: Physician Assistant

## 2020-07-19 MED ORDER — LEVOTHYROXINE SODIUM 25 MCG PO TABS: 25.0000 ug | ORAL_TABLET | Freq: Every day | ORAL | 1 refills | Status: DC

## 2020-07-19 NOTE — Addendum Note (Signed)
Addended by: Donella Stade on: 07/19/2020 04:53 PM   Modules accepted: Orders

## 2020-07-19 NOTE — Progress Notes (Signed)
Done

## 2020-07-19 NOTE — Progress Notes (Signed)
error 

## 2020-07-19 NOTE — Progress Notes (Signed)
TSH is elevated meaning you are hypothyroid. You should start medication daily in morning to help . Are you ok with this? Make sure patient knows to take alone before eating or drinking for best results.   Please fax lithium level to [facility].

## 2020-07-19 NOTE — Progress Notes (Signed)
So pt has hx of swelling with levothyroxine. Does she want to try armour thyroid or BRAND synthroid?

## 2020-07-22 ENCOUNTER — Encounter: Payer: Self-pay | Admitting: Physical Therapy

## 2020-07-22 ENCOUNTER — Other Ambulatory Visit: Payer: Self-pay

## 2020-07-22 ENCOUNTER — Ambulatory Visit: Payer: Medicare Other | Attending: Orthopaedic Surgery | Admitting: Physical Therapy

## 2020-07-22 DIAGNOSIS — M25512 Pain in left shoulder: Secondary | ICD-10-CM | POA: Diagnosis not present

## 2020-07-22 DIAGNOSIS — M25612 Stiffness of left shoulder, not elsewhere classified: Secondary | ICD-10-CM | POA: Diagnosis not present

## 2020-07-22 DIAGNOSIS — G8929 Other chronic pain: Secondary | ICD-10-CM | POA: Insufficient documentation

## 2020-07-22 NOTE — Therapy (Signed)
[name] [place], [place], [address] Phone: [phone]   Fax:  [phone]  Physical Therapy Evaluation  Patient Details  Name: Amber Rice MRN: [id number] Date of Birth: 06/28/1956 Referring Provider (PT): Jasper Loser MD   Encounter Date: 07/22/2020   PT End of Session - 07/22/20 1421    Visit Number 1    Number of Visits 16    Date for PT Re-Evaluation 10/20/20    Authorization Type FOTO AT LEAST EVERY 5TH VISIT.  PROGRESS NOTE AT 10TH VISIT.  KX MODIFIER AFTER 15 VISITS.    PT Start Time 0147    PT Stop Time 0230    PT Time Calculation (min) 43 min    Activity Tolerance Patient tolerated treatment well    Behavior During Therapy WFL for tasks assessed/performed           Past Medical History:  Diagnosis Date  . Degenerative disc disease, lumbar    diagnosed in 2005  . Hyperlipidemia   . Impacted cerumen of left ear 04/23/2017    Past Surgical History:  Procedure Laterality Date  . ABDOMINAL HYSTERECTOMY    . APPENDECTOMY    . JOINT REPLACEMENT    . LAPAROTOMY    . TONSILLECTOMY      There were no vitals filed for this visit.    Subjective Assessment - 07/22/20 1509    Subjective COVID-19 screen performed prior to patient entering clinic. The patient presents to the clinic today s/p revers total shoulder replacement performed on 1028/21.  She has had several weeks of home health physical therapy and has even started some AROM at 6 weeks post-op.  She is not in a sling today.  Her pain is rated at a 6/10 and can rise to an 8/10 with increased movement of her left shoulder.  Rest her UE decreases her pain.    Pertinent History Right shoulder surgery, previous left shoulder surgery, lumbar DDD.    Patient Stated Goals Use left UE functionally with less pain.    Currently in Pain? Yes    Pain Score 6     Pain Location Shoulder    Pain Orientation Left    Pain Descriptors / Indicators  Sharp;Throbbing;Sore    Pain Type Chronic pain    Pain Onset More than a month ago    Pain Frequency Constant    Aggravating Factors  See above.    Pain Relieving Factors See above.              [facility] PT Assessment - 07/22/20 0001      Assessment   Medical Diagnosis Chronic left shoudler pain.    Referring Provider (PT) Jasper Loser MD    Onset Date/Surgical Date --   06/03/20 (surgery date).     Precautions   Precaution Comments Proceed per reverse total shoulder protocol.      Restrictions   Weight Bearing Restrictions --   No UE weight bearing.     Balance Screen   Has the patient fallen in the past 6 months No    Has the patient had a decrease in activity level because of a fear of falling?  No    Is the patient reluctant to leave their home because of a fear of falling?  No      Home Environment   Living Environment Private residence      Prior Function   Level of Independence Independent      Observation/Other Assessments  Focus on Therapeutic Outcomes (FOTO)  66% limitation.      Observation/Other Assessments-Edema    Edema --   Min+ left shoulder edema.     ROM / Strength   AROM / PROM / Strength PROM      PROM   Overall PROM Comments In supine:  Patient's PAROM into flexion= 132 degrees, ER= 42 degrees.      Palpation   Palpation comment Patient diffusely tender to palpation over her anterior shoulder region and into biceps region.  Incision appears to be healing well.      Ambulation/Gait   Gait Comments WNL.                      Objective measurements completed on examination: See above findings.       [facility] Adult PT Treatment/Exercise - 07/22/20 0001      Modalities   Modalities Electrical Stimulation;Vasopneumatic      Electrical Stimulation   Electrical Stimulation Location Left shoulder.    Electrical Stimulation Action IFC on 80-150 Hz x 17 minutes at 40% scan.      Vasopneumatic   Number Minutes Vasopneumatic  17  minutes    Vasopnuematic Location  --   Left shoulder with pillow between thorax and elbow.   Vasopneumatic Pressure Low                       PT Long Term Goals - 07/22/20 1627      PT LONG TERM GOAL #1   Title Independent with a HEP.    Time 8    Period Weeks    Status New      PT LONG TERM GOAL #2   Title Active left shoulder flexion to 145 degrees so the patient can easily reach overhead.    Time 8    Period Weeks    Status New      PT LONG TERM GOAL #3   Title Active ER to 70 degrees+ to allow for easily donning/doffing of apparel.    Time 8    Period Weeks    Status New      PT LONG TERM GOAL #4   Title Increase left shoulder strength to a solid 4/5 to increase stability for performance of functional activities.    Time 8    Period Weeks    Status New      PT LONG TERM GOAL #5   Title Perform ADL's with pain not > 3-4/10.    Time 8    Period Weeks    Status New                  Plan - 07/22/20 1550    Clinical Impression Statement The patient presenst to the clinic today s/p left reverse TSA performed on 06/03/20.  She is doing very well thus far with an expected loss of motion.  Her incision looks good.  She is diffusely tender over her left anterior shoulder and bicep region.  Her FOTO limitation score is 66%.  She has had home health physical therapy and is compliant to her HEP.  Patient will benefit from skilled physical therapy intervention to address deficits and pain.    Personal Factors and Comorbidities Comorbidity 1;Comorbidity 2    Comorbidities Right shoulder surgery, previous left shoulder surgery, lumbar DDD.    Examination-Activity Limitations Other;Reach Overhead    Examination-Participation Restrictions Other    Stability/Clinical Decision Making Stable/Uncomplicated  Clinical Decision Making Low    Rehab Potential Excellent    PT Frequency 2x / week    PT Duration 8 weeks    PT Treatment/Interventions ADLs/Self Care  Home Management;Cryotherapy;Electrical Stimulation;Moist Heat;Iontophoresis 4mg /ml Dexamethasone;Therapeutic activities;Therapeutic exercise;Neuromuscular re-education;Manual techniques;Patient/family education;Passive range of motion;Vasopneumatic Device    PT Next Visit Plan Progress per reverse total shoulder protocol.  Begin pulleys, UE Ranger, wall ladder, supine cane exercises, P, AA, AROM.  Vasopneumatic (pillow between elbow and thorax), electrical stimulation.    Consulted and Agree with Plan of Care Patient           Patient will benefit from skilled therapeutic intervention in order to improve the following deficits and impairments:  Pain,Increased edema,Decreased activity tolerance,Decreased range of motion  Visit Diagnosis: Chronic left shoulder pain - Plan: PT plan of care cert/re-cert  Stiffness of left shoulder, not elsewhere classified - Plan: PT plan of care cert/re-cert     Problem List Patient Active Problem List   Diagnosis Date Noted  . Chronic pain syndrome 05/05/2020  . RLS (restless legs syndrome) 04/16/2020  . Chronic venous stasis 01/19/2020  . Weakness 12/22/2019  . Fatigue 12/22/2019  . Chronic bilateral low back pain with bilateral sciatica 11/11/2019  . Elevated blood pressure reading 10/24/2019  . Proteinuria 10/24/2019  . Bilateral lower extremity edema 10/22/2019  . Primary osteoarthritis of right ankle 08/21/2019  . Glossitis 08/14/2019  . Chronic cough 07/23/2019  . Arthralgia 07/23/2019  . Myalgia 06/23/2019  . BMI 35.0-35.9,adult 12/19/2018  . Colon polyp 01/04/2018  . Tobacco abuse 10/12/2016  . Callus 05/08/2016  . Leukocytosis 01/14/2016  . Acquired hypothyroidism 01/14/2016  . Chronic SI joint pain 09/21/2015  . Bilateral carpal tunnel syndrome 05/03/2015  . DDD (degenerative disc disease), cervical 05/03/2015  . Lumbar disc herniation with radiculopathy 05/03/2015  . Left shoulder pain 03/01/2015  . Osteoarthritis of left  glenohumeral joint 04/30/2014  . Hyperlipidemia 07/09/2013  . GERD (gastroesophageal reflux disease) 07/09/2013  . DDD (degenerative disc disease), lumbosacral 07/09/2013  . Bipolar 1 disorder ([place]) 07/09/2013    Elliot Simoneaux, [place] MPT 07/22/2020, 4:30 PM  [facility] [place], [place], [address] Phone: [phone]   Fax:  [phone]  Name: Ethne Jeon MRN: [id number] Date of Birth: Jul 27, 1956

## 2020-07-26 ENCOUNTER — Ambulatory Visit: Payer: Medicare Other | Admitting: Physical Therapy

## 2020-07-26 ENCOUNTER — Other Ambulatory Visit: Payer: Self-pay

## 2020-07-26 DIAGNOSIS — G8929 Other chronic pain: Secondary | ICD-10-CM | POA: Diagnosis not present

## 2020-07-26 DIAGNOSIS — M25612 Stiffness of left shoulder, not elsewhere classified: Secondary | ICD-10-CM | POA: Diagnosis not present

## 2020-07-26 DIAGNOSIS — M25512 Pain in left shoulder: Secondary | ICD-10-CM | POA: Diagnosis not present

## 2020-07-26 NOTE — Therapy (Signed)
[name] [place], [place], [address] Phone: [phone]   Fax:  [phone]  Physical Therapy Treatment  Patient Details  Name: Cachet Mccutchen MRN: [id number] Date of Birth: 09-29-1955 Referring Provider (PT): Jasper Loser MD   Encounter Date: 07/26/2020   PT End of Session - 07/26/20 1533    Visit Number 2    Number of Visits 16    Date for PT Re-Evaluation 10/20/20    Authorization Type FOTO AT LEAST EVERY 5TH VISIT.  PROGRESS NOTE AT 10TH VISIT.  KX MODIFIER AFTER 15 VISITS.    PT Start Time 0147    PT Stop Time 0230    PT Time Calculation (min) 43 min    Activity Tolerance Patient tolerated treatment well    Behavior During Therapy WFL for tasks assessed/performed           Past Medical History:  Diagnosis Date  . Degenerative disc disease, lumbar    diagnosed in 2005  . Hyperlipidemia   . Impacted cerumen of left ear 04/23/2017    Past Surgical History:  Procedure Laterality Date  . ABDOMINAL HYSTERECTOMY    . APPENDECTOMY    . JOINT REPLACEMENT    . LAPAROTOMY    . TONSILLECTOMY      There were no vitals filed for this visit.   Subjective Assessment - 07/26/20 1533    Subjective COVID-19 screen performed prior to patient entering clinic.  Pain pretty good over the weekend but high today.    Pertinent History Previous left shoulder surgery, lumbar DDD.    Patient Stated Goals Use left UE functionally with less pain.    Currently in Pain? Yes    Pain Score 8     Pain Location Shoulder    Pain Orientation Left    Pain Descriptors / Indicators Sharp;Throbbing;Sore    Pain Type Chronic pain    Pain Onset More than a month ago                             [facility] Adult PT Treatment/Exercise - 07/26/20 0001      Modalities   Modalities Electrical Stimulation;Vasopneumatic      Electrical Stimulation   Electrical Stimulation Location Left shoulder.    Electrical Stimulation  Action IFC at 80-150 Hz on 40% scan x 20 minutes.    Electrical Stimulation Goals Edema      Vasopneumatic   Number Minutes Vasopneumatic  20 minutes    Vasopnuematic Location  --   Left elbow with pillow btw elbow and thorax.     Manual Therapy   Manual Therapy Passive ROM    Passive ROM In supine:  PAROM to patient's left shoulder into flexion and ER x 15 minutes.                       PT Long Term Goals - 07/22/20 1627      PT LONG TERM GOAL #1   Title Independent with a HEP.    Time 8    Period Weeks    Status New      PT LONG TERM GOAL #2   Title Active left shoulder flexion to 145 degrees so the patient can easily reach overhead.    Time 8    Period Weeks    Status New      PT LONG TERM GOAL #3   Title Active ER to  70 degrees+ to allow for easily donning/doffing of apparel.    Time 8    Period Weeks    Status New      PT LONG TERM GOAL #4   Title Increase left shoulder strength to a solid 4/5 to increase stability for performance of functional activities.    Time 8    Period Weeks    Status New      PT LONG TERM GOAL #5   Title Perform ADL's with pain not > 3-4/10.    Time 8    Period Weeks    Status New                 Plan - 07/26/20 1538    Clinical Impression Statement The patient reporting increased pain today though over the weekend her left shoulder felt quite good.  She did well with PAROM in supine to her left shoulder.    Personal Factors and Comorbidities Comorbidity 1;Comorbidity 2    Comorbidities Previous left shoulder surgery, lumbar DDD.    Examination-Activity Limitations Other;Reach Overhead    Examination-Participation Restrictions Other    Stability/Clinical Decision Making Stable/Uncomplicated    Rehab Potential Excellent    PT Frequency 2x / week    PT Duration 8 weeks    PT Treatment/Interventions ADLs/Self Care Home Management;Cryotherapy;Electrical Stimulation;Moist Heat;Iontophoresis 4mg /ml  Dexamethasone;Therapeutic activities;Therapeutic exercise;Neuromuscular re-education;Manual techniques;Patient/family education;Passive range of motion;Vasopneumatic Device    PT Next Visit Plan Progress per reverse total shoulder protocol.  Begin pulleys, UE Ranger, wall ladder, supine cane exercises, P, AA, AROM.  Vasopneumatic (pillow between elbow and thorax), electrical stimulation.    Consulted and Agree with Plan of Care Patient           Patient will benefit from skilled therapeutic intervention in order to improve the following deficits and impairments:  Pain,Increased edema,Decreased activity tolerance,Decreased range of motion  Visit Diagnosis: Chronic left shoulder pain  Stiffness of left shoulder, not elsewhere classified     Problem List Patient Active Problem List   Diagnosis Date Noted  . Chronic pain syndrome 05/05/2020  . RLS (restless legs syndrome) 04/16/2020  . Chronic venous stasis 01/19/2020  . Weakness 12/22/2019  . Fatigue 12/22/2019  . Chronic bilateral low back pain with bilateral sciatica 11/11/2019  . Elevated blood pressure reading 10/24/2019  . Proteinuria 10/24/2019  . Bilateral lower extremity edema 10/22/2019  . Primary osteoarthritis of right ankle 08/21/2019  . Glossitis 08/14/2019  . Chronic cough 07/23/2019  . Arthralgia 07/23/2019  . Myalgia 06/23/2019  . BMI 35.0-35.9,adult 12/19/2018  . Colon polyp 01/04/2018  . Tobacco abuse 10/12/2016  . Callus 05/08/2016  . Leukocytosis 01/14/2016  . Acquired hypothyroidism 01/14/2016  . Chronic SI joint pain 09/21/2015  . Bilateral carpal tunnel syndrome 05/03/2015  . DDD (degenerative disc disease), cervical 05/03/2015  . Lumbar disc herniation with radiculopathy 05/03/2015  . Left shoulder pain 03/01/2015  . Osteoarthritis of left glenohumeral joint 04/30/2014  . Hyperlipidemia 07/09/2013  . GERD (gastroesophageal reflux disease) 07/09/2013  . DDD (degenerative disc disease), lumbosacral  07/09/2013  . Bipolar 1 disorder ([place]) 07/09/2013    Tanyika Barros, [place] MPT 07/26/2020, 3:43 PM  [facility] [address] [place], [place], [address] Phone: [phone]   Fax:  [phone]  Name: Delinda Malan MRN: [id number] Date of Birth: 05-23-1956

## 2020-07-27 ENCOUNTER — Telehealth (INDEPENDENT_AMBULATORY_CARE_PROVIDER_SITE_OTHER): Payer: Medicare Other | Admitting: Psychiatry

## 2020-07-27 ENCOUNTER — Encounter (HOSPITAL_COMMUNITY): Payer: Self-pay | Admitting: Psychiatry

## 2020-07-27 DIAGNOSIS — G8929 Other chronic pain: Secondary | ICD-10-CM

## 2020-07-27 DIAGNOSIS — F315 Bipolar disorder, current episode depressed, severe, with psychotic features: Secondary | ICD-10-CM | POA: Diagnosis not present

## 2020-07-27 NOTE — Progress Notes (Signed)
Patient ID: Amber Rice, female   DOB: [birth year], 64 y.o.   MRN: [id number] Elizabeth [id number] 64 y.o.  07/27/2020  Chief Complaint: Follow up bipolar disorder. Med review       I connected with Amber Rice on 07/27/20 at 10:30 AM EST by telephone and verified that I am speaking with the correct person using two identifiers.   I discussed the limitations, risks, security and privacy concerns of performing an evaluation and management service by telephone and the availability of in person appointments. I also discussed with the patient that there may be a patient responsible charge related to this service. The patient expressed understanding and agreed to proceed.  Patient location home  Provider location home office  History of Present Illness:   Diagnosed with bipolar disorder depressed. Anxiety disorder NOS. Insomnia  Has back pain and follows up with Dr. [name] or [name].   Recent shoulder surgery done , recovering but it keeps her limited and effects mood Lithium level 0.5, TSH high , now on thryoid medicine Does not want to change lithium as it keeps her balanced mood wise   no paranoia  Duration of bipolar :12 plus years  Aggravating factor : shoulder surgery Depression scale 7/10 . 10 being no depression  Modifying factors; walking when she can . Reading books   Past Medical History:  Diagnosis Date  . Degenerative disc disease, lumbar    diagnosed in 2005  . Hyperlipidemia   . Impacted cerumen of left ear 04/23/2017   Family History  Problem Relation Age of Onset  . Cancer Mother   . Heart attack Father   . Hyperlipidemia Father   . Cancer Maternal Grandmother   . Cancer Paternal Grandmother   . Bipolar disorder Paternal Uncle        Committed suicide in his 7's  . Dementia Paternal Uncle   . Depression Cousin   . Schizophrenia Cousin   . Multiple sclerosis Sister   . Breast cancer Cousin    . Alcohol abuse Neg Hx   . Anxiety disorder Neg Hx     Outpatient Encounter Medications as of 07/27/2020  Medication Sig  . cyclobenzaprine (FLEXERIL) 10 MG tablet Take 1 tablet (10 mg total) by mouth 3 (three) times daily as needed for muscle spasms.  Marland Kitchen HYDROcodone-acetaminophen (NORCO/VICODIN) 5-325 MG tablet Take 1 tablet by mouth every 12 (twelve) hours as needed for moderate pain.  Marland Kitchen levothyroxine (SYNTHROID) 25 MCG tablet Take 1 tablet (25 mcg total) by mouth daily before breakfast.  . lithium carbonate (LITHOBID) 300 MG CR tablet TAKE 1 TABLET (300 MG TOTAL) BY MOUTH 2 (TWO) TIMES DAILY.  Marland Kitchen rOPINIRole (REQUIP) 0.25 MG tablet TAKE 1-4 TABLETS BY MOUTH 1 HOUR BEFORE BED FOR RESTLESS LEG.  . sertraline (ZOLOFT) 100 MG tablet TAKE 1 TABLET BY MOUTH TWICE A DAY   No facility-administered encounter medications on file as of 07/27/2020.    Recent Results (from the past 2160 hour(s))  DRUG MONITORING, PANEL 6 WITH CONFIRMATION, URINE     Status: None   Collection Time: 05/05/20  1:40 PM  Result Value Ref Range   Alcohol Metabolites NEGATIVE ng/mL   Amphetamines NEGATIVE <500 ng/mL   Barbiturates NEGATIVE <300 ng/mL   Benzodiazepines NEGATIVE <100 ng/mL   Cocaine Metabolite NEGATIVE <150 ng/mL   6 Acetylmorphine NEGATIVE <10 ng/mL   Marijuana Metabolite NEGATIVE <20 ng/mL   Methadone Metabolite NEGATIVE <100 ng/mL   Opiates NEGATIVE <100  ng/mL   Oxycodone NEGATIVE <100 ng/mL   Phencyclidine NEGATIVE <25 ng/mL   Creatinine 30.3 mg/dL   pH 6.4 4.5 - 9.0   Oxidant NEGATIVE mcg/mL  DM TEMPLATE     Status: None   Collection Time: 05/05/20  1:40 PM  Result Value Ref Range   Notes and Comments      Comment: This drug testing is for medical treatment only. Analysis was performed as non-forensic testing and these results should be used only by healthcare providers to render diagnosis or treatment, or to monitor progress of medical conditions. Marland Kitchen LDT Notes: Confirmation tests were  developed and their analytical  performance characteristics have been determined by  [employer]. It has not been cleared or approved  by the FDA. This assay has been validated pursuant to  the CLIA regulations and is used for clinical purposes. . . Healthcare Providers needing Interpretation assistance,  please contact us at [phone] ([phone])  M-F, 8am to 10pm EST   Lithium level     Status: Abnormal   Collection Time: 07/16/20  8:49 AM  Result Value Ref Range   Lithium Lvl 0.5 (L) 0.6 - 1.2 mmol/L  TSH     Status: Abnormal   Collection Time: 07/16/20  8:49 AM  Result Value Ref Range   TSH 6.40 (H) 0.40 - 4.50 mIU/L    There were no vitals taken for this visit.   Review of Systems  Cardiovascular: Negative for chest pain.  Musculoskeletal: Positive for joint pain.  Psychiatric/Behavioral: Negative for substance abuse and suicidal ideas.    Mental Status Examination  Appearance:  Alert: Yes Attention: fair  Cooperative: Yes Eye Contact:  Speech: normal Psychomotor Activity: Decreased Memory/Concentration: adequate Oriented: person, place, time/date and situation Mood: subdued some Affect: congruent Thought Processes and Associations: Coherent no paranoia as of now [employer] of Knowledge: Fair Thought Content: Suicidal ideation and Homicidal ideation were denied Insight: Fair Judgement: Fair  Diagnosis: Bipolar disorder type I manic per history for psychotic features. Insomnia NOS. Adjustment disorder with anxiety . EPS Treatment Plan:   1. Bipolar: depression  Somewhat subdued due to surgery overall feels lithium helps does not want to change it   2. Depression: see above, continue zoloft 3. Insomnia:irregular sleep hygiene reviewed. Pain effects mood.   I discussed the assessment and treatment plan with the patient. The patient was provided an opportunity to ask questions and all were answered. The patient agreed with the plan and demonstrated  an understanding of the instructions.   The patient was advised to call back or seek an in-person evaluation if the symptoms worsen or if the condition fails to improve as anticipated.  I provided 15  minutes of non-face-to-face time during this encounter. Fu 3 -69m.   [name], MD

## 2020-07-29 ENCOUNTER — Ambulatory Visit: Payer: Medicare Other | Admitting: Physical Therapy

## 2020-08-02 ENCOUNTER — Ambulatory Visit: Payer: Medicare Other | Admitting: Physical Therapy

## 2020-08-02 ENCOUNTER — Encounter: Payer: Self-pay | Admitting: Physical Therapy

## 2020-08-02 ENCOUNTER — Other Ambulatory Visit: Payer: Self-pay

## 2020-08-02 DIAGNOSIS — M25512 Pain in left shoulder: Secondary | ICD-10-CM | POA: Diagnosis not present

## 2020-08-02 DIAGNOSIS — M25612 Stiffness of left shoulder, not elsewhere classified: Secondary | ICD-10-CM

## 2020-08-02 DIAGNOSIS — G8929 Other chronic pain: Secondary | ICD-10-CM | POA: Diagnosis not present

## 2020-08-02 NOTE — Therapy (Signed)
[name] [place], [place], [address] Phone: [phone]   Fax:  [phone]  Physical Therapy Treatment  Patient Details  Name: Amber Rice MRN: [id number] Date of Birth: Mar 03, 1956 Referring Provider (PT): Jasper Loser MD   Encounter Date: 08/02/2020   PT End of Session - 08/02/20 1433    Visit Number 3    Number of Visits 16    Date for PT Re-Evaluation 10/20/20    Authorization Type FOTO AT LEAST EVERY 5TH VISIT.  PROGRESS NOTE AT 10TH VISIT.  KX MODIFIER AFTER 15 VISITS.    PT Start Time 1431    PT Stop Time 1515    PT Time Calculation (min) 44 min    Activity Tolerance Patient tolerated treatment well    Behavior During Therapy WFL for tasks assessed/performed           Past Medical History:  Diagnosis Date  . Degenerative disc disease, lumbar    diagnosed in 2005  . Hyperlipidemia   . Impacted cerumen of left ear 04/23/2017    Past Surgical History:  Procedure Laterality Date  . ABDOMINAL HYSTERECTOMY    . APPENDECTOMY    . JOINT REPLACEMENT    . LAPAROTOMY    . TONSILLECTOMY      There were no vitals filed for this visit.   Subjective Assessment - 08/02/20 1432    Subjective COVID-19 screen performed prior to patient entering clinic. Reports her shoulder just hurts primarily with movement.    Pertinent History Previous left shoulder surgery, lumbar DDD.    Patient Stated Goals Use left UE functionally with less pain.    Currently in Pain? Yes    Pain Score --   No pain score provided   Pain Location Shoulder    Pain Orientation Left    Pain Descriptors / Indicators Discomfort    Pain Type Chronic pain    Pain Onset More than a month ago    Pain Frequency Constant              [facility] PT Assessment - 08/02/20 0001      Assessment   Medical Diagnosis Chronic left shoudler pain.    Referring Provider (PT) Jasper Loser MD    Onset Date/Surgical Date 06/03/20    Next MD Visit  08/27/2020      Precautions   Precaution Comments Proceed per reverse total shoulder protocol.                         [facility] Adult PT Treatment/Exercise - 08/02/20 0001      Exercises   Exercises Shoulder      Shoulder Exercises: Pulleys   Flexion 5 minutes      Shoulder Exercises: ROM/Strengthening   Ranger Seated in flex x3 min, circles x3 min    Other ROM/Strengthening Exercises wall ladder x5 reps (max at 27)      Modalities   Modalities Vasopneumatic      Vasopneumatic   Number Minutes Vasopneumatic  15 minutes    Vasopnuematic Location  Shoulder    Vasopneumatic Pressure Low    Vasopneumatic Temperature  34/pain      Manual Therapy   Manual Therapy Passive ROM    Passive ROM PROM of L shoulder into flex/ ER/ IR with holds at end range                       PT Long  Term Goals - 07/22/20 1627      PT LONG TERM GOAL #1   Title Independent with a HEP.    Time 8    Period Weeks    Status New      PT LONG TERM GOAL #2   Title Active left shoulder flexion to 145 degrees so the patient can easily reach overhead.    Time 8    Period Weeks    Status New      PT LONG TERM GOAL #3   Title Active ER to 70 degrees+ to allow for easily donning/doffing of apparel.    Time 8    Period Weeks    Status New      PT LONG TERM GOAL #4   Title Increase left shoulder strength to a solid 4/5 to increase stability for performance of functional activities.    Time 8    Period Weeks    Status New      PT LONG TERM GOAL #5   Title Perform ADL's with pain not > 3-4/10.    Time 8    Period Weeks    Status New                 Plan - 08/02/20 1510    Clinical Impression Statement Patient presented in clinic with reports of discomfort intermittantly but greatest complaint during therex of muscle fatigue. Patient provided handout for home pulley system. Patient reports posterior shoulder discomfort with lying supine and shoulder resting in  extension. Patient fatigued very quickly with therex and therefore limited. Firm end feels and smooth arc of motion noted during PROM. Normal vasopneumatic response noted following removal of the modality.    Personal Factors and Comorbidities Comorbidity 1;Comorbidity 2    Comorbidities Previous left shoulder surgery, lumbar DDD.    Examination-Activity Limitations Other;Reach Overhead    Examination-Participation Restrictions Other    Stability/Clinical Decision Making Stable/Uncomplicated    Rehab Potential Excellent    PT Frequency 2x / week    PT Duration 8 weeks    PT Treatment/Interventions ADLs/Self Care Home Management;Cryotherapy;Electrical Stimulation;Moist Heat;Iontophoresis 4mg /ml Dexamethasone;Therapeutic activities;Therapeutic exercise;Neuromuscular re-education;Manual techniques;Patient/family education;Passive range of motion;Vasopneumatic Device    PT Next Visit Plan Progress per reverse total shoulder protocol.  Begin pulleys, UE Ranger, wall ladder, supine cane exercises, P, AA, AROM.  Vasopneumatic (pillow between elbow and thorax), electrical stimulation.    Consulted and Agree with Plan of Care Patient           Patient will benefit from skilled therapeutic intervention in order to improve the following deficits and impairments:  Pain,Increased edema,Decreased activity tolerance,Decreased range of motion  Visit Diagnosis: Chronic left shoulder pain  Stiffness of left shoulder, not elsewhere classified     Problem List Patient Active Problem List   Diagnosis Date Noted  . Chronic pain syndrome 05/05/2020  . RLS (restless legs syndrome) 04/16/2020  . Chronic venous stasis 01/19/2020  . Weakness 12/22/2019  . Fatigue 12/22/2019  . Chronic bilateral low back pain with bilateral sciatica 11/11/2019  . Elevated blood pressure reading 10/24/2019  . Proteinuria 10/24/2019  . Bilateral lower extremity edema 10/22/2019  . Primary osteoarthritis of right ankle  08/21/2019  . Glossitis 08/14/2019  . Chronic cough 07/23/2019  . Arthralgia 07/23/2019  . Myalgia 06/23/2019  . BMI 35.0-35.9,adult 12/19/2018  . Colon polyp 01/04/2018  . Tobacco abuse 10/12/2016  . Callus 05/08/2016  . Leukocytosis 01/14/2016  . Acquired hypothyroidism 01/14/2016  . Chronic SI joint pain  09/21/2015  . Bilateral carpal tunnel syndrome 05/03/2015  . DDD (degenerative disc disease), cervical 05/03/2015  . Lumbar disc herniation with radiculopathy 05/03/2015  . Left shoulder pain 03/01/2015  . Osteoarthritis of left glenohumeral joint 04/30/2014  . Hyperlipidemia 07/09/2013  . GERD (gastroesophageal reflux disease) 07/09/2013  . DDD (degenerative disc disease), lumbosacral 07/09/2013  . Bipolar 1 disorder ([place]) 07/09/2013    Standley Brooking, PTA 08/02/2020, 3:22 PM  [name] [address] [place], [place], [address] Phone: [phone]   Fax:  [phone]  Name: Kemani Pursley MRN: [id number] Date of Birth: 1955-08-28

## 2020-08-05 ENCOUNTER — Ambulatory Visit: Payer: Medicare Other | Admitting: Physical Therapy

## 2020-08-05 DIAGNOSIS — M25512 Pain in left shoulder: Secondary | ICD-10-CM

## 2020-08-05 DIAGNOSIS — M25612 Stiffness of left shoulder, not elsewhere classified: Secondary | ICD-10-CM | POA: Diagnosis not present

## 2020-08-05 DIAGNOSIS — G8929 Other chronic pain: Secondary | ICD-10-CM

## 2020-08-05 NOTE — Therapy (Signed)
[name] [address] [place], [place], [address] Phone: [phone]   Fax:  [phone]  Physical Therapy Treatment  Patient Details  Name: Amber Rice MRN: [id number] Date of Birth: 04/09/1956 Referring Provider (PT): Linus SalmonsJames Starman MD   Encounter Date: 08/05/2020   PT End of Session - 08/05/20 1506    Visit Number 4    Number of Visits 16    Date for PT Re-Evaluation 10/20/20    Authorization Type FOTO AT LEAST EVERY 5TH VISIT.  PROGRESS NOTE AT 10TH VISIT.  KX MODIFIER AFTER 15 VISITS.    PT Start Time 0145    PT Stop Time 0237    PT Time Calculation (min) 52 min    Activity Tolerance Patient tolerated treatment well    Behavior During Therapy WFL for tasks assessed/performed           Past Medical History:  Diagnosis Date  . Degenerative disc disease, lumbar    diagnosed in 2005  . Hyperlipidemia   . Impacted cerumen of left ear 04/23/2017    Past Surgical History:  Procedure Laterality Date  . ABDOMINAL HYSTERECTOMY    . APPENDECTOMY    . JOINT REPLACEMENT    . LAPAROTOMY    . TONSILLECTOMY      There were no vitals filed for this visit.   Subjective Assessment - 08/05/20 1508    Subjective COVID-19 screen performed prior to patient entering clinic.  No new complaints.    Pertinent History Previous left shoulder surgery, lumbar DDD.    Patient Stated Goals Use left UE functionally with less pain.    Currently in Pain? Yes    Pain Score 2     Pain Orientation Left    Pain Descriptors / Indicators Throbbing    Pain Onset More than a month ago                             [facility]Ex[facility] - 08/05/20 0001      Exercises   Exercises Shoulder      Shoulder Exercises: Pulleys   Flexion 5 minutes    Other Pulley Exercises Wall ladder x 5 minutes f/b UE Ranger on wall x 5 minutes into flexion and circles (CW and CCW).      Modalities   Modalities Educational psychologistlectrical  Stimulation;Vasopneumatic      Electrical Stimulation   Electrical Stimulation Location Left shoulder.    Electrical Stimulation Action IFC at 80-150 Hz (non-motoric) x 20 minutes      Vasopneumatic   Number Minutes Vasopneumatic  20 minutes    Vasopnuematic Location  --   Left shoulder.   Vasopneumatic Pressure Low      Manual Therapy   Manual Therapy Passive ROM    Passive ROM PROM of patient's left shoulder and assisted supine punches and rhy stabs x 8 minutes.                       PT Long Term Goals - 07/22/20 1627      PT LONG TERM GOAL #1   Title Independent with a HEP.    Time 8    Period Weeks    Status New      PT LONG TERM GOAL #2   Title Active left shoulder flexion to 145 degrees so the patient can easily reach overhead.    Time 8    Period Weeks  Status New      PT LONG TERM GOAL #3   Title Active ER to 70 degrees+ to allow for easily donning/doffing of apparel.    Time 8    Period Weeks    Status New      PT LONG TERM GOAL #4   Title Increase left shoulder strength to a solid 4/5 to increase stability for performance of functional activities.    Time 8    Period Weeks    Status New      PT LONG TERM GOAL #5   Title Perform ADL's with pain not > 3-4/10.    Time 8    Period Weeks    Status New                 Plan - 08/05/20 1512    Clinical Impression Statement Patient did great today.  Added UE Ranger on wall which the patient provided with excellent technique and no complaints.    Personal Factors and Comorbidities Comorbidity 1;Comorbidity 2    Comorbidities Previous left shoulder surgery, lumbar DDD.    Examination-Activity Limitations Other;Reach Overhead    Examination-Participation Restrictions Other    Stability/Clinical Decision Making Stable/Uncomplicated    Rehab Potential Excellent    PT Frequency 2x / week    PT Duration 8 weeks    PT Treatment/Interventions ADLs/Self Care Home  Management;Cryotherapy;Electrical Stimulation;Moist Heat;Iontophoresis 4mg /ml Dexamethasone;Therapeutic activities;Therapeutic exercise;Neuromuscular re-education;Manual techniques;Patient/family education;Passive range of motion;Vasopneumatic Device    PT Next Visit Plan Progress per reverse total shoulder protocol.  Begin pulleys, UE Ranger, wall ladder, supine cane exercises, P, AA, AROM.  Vasopneumatic (pillow between elbow and thorax), electrical stimulation.    Consulted and Agree with Plan of Care Patient           Patient will benefit from skilled therapeutic intervention in order to improve the following deficits and impairments:  Pain,Increased edema,Decreased activity tolerance,Decreased range of motion  Visit Diagnosis: Chronic left shoulder pain  Stiffness of left shoulder, not elsewhere classified     Problem List Patient Active Problem List   Diagnosis Date Noted  . Chronic pain syndrome 05/05/2020  . RLS (restless legs syndrome) 04/16/2020  . Chronic venous stasis 01/19/2020  . Weakness 12/22/2019  . Fatigue 12/22/2019  . Chronic bilateral low back pain with bilateral sciatica 11/11/2019  . Elevated blood pressure reading 10/24/2019  . Proteinuria 10/24/2019  . Bilateral lower extremity edema 10/22/2019  . Primary osteoarthritis of right ankle 08/21/2019  . Glossitis 08/14/2019  . Chronic cough 07/23/2019  . Arthralgia 07/23/2019  . Myalgia 06/23/2019  . BMI 35.0-35.9,adult 12/19/2018  . Colon polyp 01/04/2018  . Tobacco abuse 10/12/2016  . Callus 05/08/2016  . Leukocytosis 01/14/2016  . Acquired hypothyroidism 01/14/2016  . Chronic SI joint pain 09/21/2015  . Bilateral carpal tunnel syndrome 05/03/2015  . DDD (degenerative disc disease), cervical 05/03/2015  . Lumbar disc herniation with radiculopathy 05/03/2015  . Left shoulder pain 03/01/2015  . Osteoarthritis of left glenohumeral joint 04/30/2014  . Hyperlipidemia 07/09/2013  . GERD  (gastroesophageal reflux disease) 07/09/2013  . DDD (degenerative disc disease), lumbosacral 07/09/2013  . Bipolar 1 disorder ([facility]) 07/09/2013    Xan Ingraham, 14/10/2012 MPT 08/05/2020, 4:11 PM  [facility] [address] [place], [place], [place] Phone: [phone]   Fax:  [phone]  Name: Amber Rice MRN: Amber Medici Date of Birth: 07-12-1956

## 2020-08-09 ENCOUNTER — Ambulatory Visit: Payer: Medicare Other | Admitting: Physical Therapy

## 2020-08-10 ENCOUNTER — Other Ambulatory Visit: Payer: Self-pay | Admitting: Physician Assistant

## 2020-08-10 ENCOUNTER — Ambulatory Visit: Payer: Medicare Other | Attending: Orthopaedic Surgery | Admitting: Physical Therapy

## 2020-08-10 ENCOUNTER — Other Ambulatory Visit: Payer: Self-pay

## 2020-08-10 ENCOUNTER — Encounter: Payer: Self-pay | Admitting: Physical Therapy

## 2020-08-10 DIAGNOSIS — M5137 Other intervertebral disc degeneration, lumbosacral region: Secondary | ICD-10-CM

## 2020-08-10 DIAGNOSIS — M25612 Stiffness of left shoulder, not elsewhere classified: Secondary | ICD-10-CM | POA: Diagnosis present

## 2020-08-10 DIAGNOSIS — G8929 Other chronic pain: Secondary | ICD-10-CM | POA: Diagnosis present

## 2020-08-10 DIAGNOSIS — M25512 Pain in left shoulder: Secondary | ICD-10-CM | POA: Insufficient documentation

## 2020-08-10 NOTE — Therapy (Signed)
[name] [address] [place], [place], [address] Phone: [phone]   Fax:  [phone]  Physical Therapy Treatment  Patient Details  Name: Amber Rice MRN: [id number] Date of Birth: [birth year] Referring Provider (PT): Linus Salmons MD   Encounter Date: 08/10/2020   PT End of Session - 08/10/20 1517    Visit Number 5    Number of Visits 16    Date for PT Re-Evaluation 10/20/20    Authorization Type FOTO AT LEAST EVERY 5TH VISIT.  PROGRESS NOTE AT 10TH VISIT.  KX MODIFIER AFTER 15 VISITS.    PT Start Time 0145    PT Stop Time 0229    PT Time Calculation (min) 44 min    Activity Tolerance Patient tolerated treatment well           Past Medical History:  Diagnosis Date  . Degenerative disc disease, lumbar    diagnosed in 2005  . Hyperlipidemia   . Impacted cerumen of left ear 04/23/2017    Past Surgical History:  Procedure Laterality Date  . ABDOMINAL HYSTERECTOMY    . APPENDECTOMY    . JOINT REPLACEMENT    . LAPAROTOMY    . TONSILLECTOMY      There were no vitals filed for this visit.   Subjective Assessment - 08/10/20 1504    Subjective COVID-19 screen performed prior to patient entering clinic.  Pain about a 3 today.    Pertinent History Previous left shoulder surgery, lumbar DDD.    Patient Stated Goals Use left UE functionally with less pain.    Currently in Pain? Yes    Pain Score 3     Pain Location Shoulder    Pain Orientation Left    Pain Descriptors / Indicators Throbbing    Pain Type Chronic pain    Pain Onset More than a month ago                             [facility] Adult PT Treatment/Exercise - 08/10/20 0001      Shoulder Exercises: Pulleys   Flexion 5 minutes    Other Pulley Exercises Wall ladder x 5 minutes.    Other Pulley Exercises UE ranger on wall x 3 minutes.      Modalities   Modalities Primary school teacher Stimulation  Location Left shoulder.    Electrical Stimulation Action IFC (non-motoric) at 80-150 Hz x 15 minutes.      Vasopneumatic   Number Minutes Vasopneumatic  15 minutes      Manual Therapy   Manual Therapy Passive ROM    Passive ROM PROM x 8 minutes to patient's left shoulder in supine.                       PT Long Term Goals - 07/22/20 1627      PT LONG TERM GOAL #1   Title Independent with a HEP.    Time 8    Period Weeks    Status New      PT LONG TERM GOAL #2   Title Active left shoulder flexion to 145 degrees so the patient can easily reach overhead.    Time 8    Period Weeks    Status New      PT LONG TERM GOAL #3   Title Active ER to 70 degrees+ to allow for easily donning/doffing  of apparel.    Time 8    Period Weeks    Status New      PT LONG TERM GOAL #4   Title Increase left shoulder strength to a solid 4/5 to increase stability for performance of functional activities.    Time 8    Period Weeks    Status New      PT LONG TERM GOAL #5   Title Perform ADL's with pain not > 3-4/10.    Time 8    Period Weeks    Status New                 Plan - 08/10/20 1515    Clinical Impression Statement Patient did very well today.  She made it to position "29" on the wall ladder compared to her non-affected side ("30').    Personal Factors and Comorbidities Comorbidity 1;Comorbidity 2    Comorbidities Previous left shoulder surgery, lumbar DDD.    Examination-Activity Limitations Other;Reach Overhead    Examination-Participation Restrictions Other    Stability/Clinical Decision Making Stable/Uncomplicated    Rehab Potential Excellent    PT Frequency 2x / week    PT Duration 8 weeks    PT Treatment/Interventions ADLs/Self Care Home Management;Cryotherapy;Electrical Stimulation;Moist Heat;Iontophoresis 4mg /ml Dexamethasone;Therapeutic activities;Therapeutic exercise;Neuromuscular re-education;Manual techniques;Patient/family education;Passive range of  motion;Vasopneumatic Device    PT Next Visit Plan Progress per reverse total shoulder protocol.  Begin pulleys, UE Ranger, wall ladder, supine cane exercises, P, AA, AROM.  Vasopneumatic (pillow between elbow and thorax), electrical stimulation.    Consulted and Agree with Plan of Care Patient           Patient will benefit from skilled therapeutic intervention in order to improve the following deficits and impairments:  Pain,Increased edema,Decreased activity tolerance,Decreased range of motion  Visit Diagnosis: Chronic left shoulder pain  Stiffness of left shoulder, not elsewhere classified     Problem List Patient Active Problem List   Diagnosis Date Noted  . Chronic pain syndrome 05/05/2020  . RLS (restless legs syndrome) 04/16/2020  . Chronic venous stasis 01/19/2020  . Weakness 12/22/2019  . Fatigue 12/22/2019  . Chronic bilateral low back pain with bilateral sciatica 11/11/2019  . Elevated blood pressure reading 10/24/2019  . Proteinuria 10/24/2019  . Bilateral lower extremity edema 10/22/2019  . Primary osteoarthritis of right ankle 08/21/2019  . Glossitis 08/14/2019  . Chronic cough 07/23/2019  . Arthralgia 07/23/2019  . Myalgia 06/23/2019  . BMI 35.0-35.9,adult 12/19/2018  . Colon polyp 01/04/2018  . Tobacco abuse 10/12/2016  . Callus 05/08/2016  . Leukocytosis 01/14/2016  . Acquired hypothyroidism 01/14/2016  . Chronic SI joint pain 09/21/2015  . Bilateral carpal tunnel syndrome 05/03/2015  . DDD (degenerative disc disease), cervical 05/03/2015  . Lumbar disc herniation with radiculopathy 05/03/2015  . Left shoulder pain 03/01/2015  . Osteoarthritis of left glenohumeral joint 04/30/2014  . Hyperlipidemia 07/09/2013  . GERD (gastroesophageal reflux disease) 07/09/2013  . DDD (degenerative disc disease), lumbosacral 07/09/2013  . Bipolar 1 disorder ([place]) 07/09/2013    Amber Rice, [place] MPT 08/10/2020, 3:18 PM  [facility] [address] [place], [place], [address] Phone: [phone]   Fax:  [phone]  Name: Amber Rice MRN: [id number] Date of Birth: Oct 22, 1955

## 2020-08-11 ENCOUNTER — Ambulatory Visit: Payer: Medicare Other | Admitting: Physical Therapy

## 2020-08-11 ENCOUNTER — Other Ambulatory Visit: Payer: Self-pay

## 2020-08-11 DIAGNOSIS — M25512 Pain in left shoulder: Secondary | ICD-10-CM

## 2020-08-11 DIAGNOSIS — M25612 Stiffness of left shoulder, not elsewhere classified: Secondary | ICD-10-CM

## 2020-08-11 DIAGNOSIS — G8929 Other chronic pain: Secondary | ICD-10-CM

## 2020-08-11 MED ORDER — HYDROCODONE-ACETAMINOPHEN 5-325 MG PO TABS: 1.0000 | ORAL_TABLET | Freq: Two times a day (BID) | ORAL | 0 refills | Status: DC | PRN

## 2020-08-11 NOTE — Telephone Encounter (Signed)
Last written 07/15/2020 #60 no refills Last appt 05/05/2020, on contract - UTD No future appts scheduled. Pended with note to make appt for future refills

## 2020-08-11 NOTE — Therapy (Signed)
[name] [address] [place], [place], [address] Phone: [phone]   Fax:  [phone]  Physical Therapy Treatment  Patient Details  Name: Amber Rice MRN: [id number] Date of Birth: 1956/03/13 Referring Provider (PT): Linus Salmons MD   Encounter Date: 08/11/2020   PT End of Session - 08/11/20 1341    Visit Number 6    Number of Visits 16    Date for PT Re-Evaluation 10/20/20    Authorization Type FOTO AT LEAST EVERY 5TH VISIT.  PROGRESS NOTE AT 10TH VISIT.  KX MODIFIER AFTER 15 VISITS.    PT Start Time 0102    PT Stop Time 0151    PT Time Calculation (min) 49 min    Activity Tolerance Patient tolerated treatment well    Behavior During Therapy WFL for tasks assessed/performed           Past Medical History:  Diagnosis Date  . Degenerative disc disease, lumbar    diagnosed in 2005  . Hyperlipidemia   . Impacted cerumen of left ear 04/23/2017    Past Surgical History:  Procedure Laterality Date  . ABDOMINAL HYSTERECTOMY    . APPENDECTOMY    . JOINT REPLACEMENT    . LAPAROTOMY    . TONSILLECTOMY      There were no vitals filed for this visit.   Subjective Assessment - 08/11/20 1341    Subjective COVID-19 screen performed prior to patient entering clinic.  No new complaints.    Pertinent History Previous left shoulder surgery, lumbar DDD.    Patient Stated Goals Use left UE functionally with less pain.    Currently in Pain? Yes    Pain Orientation Left    Pain Descriptors / Indicators Throbbing    Pain Type Chronic pain    Pain Onset More than a month ago                             [facility] Adult PT Treatment/Exercise - 08/11/20 0001      Exercises   Exercises Shoulder      Shoulder Exercises: Supine   Other Supine Exercises In supine:  Cane bench press and flexion with elbows in extension 2 sets to fatigue.      Shoulder Exercises: Pulleys   Flexion 5 minutes    Other Pulley Exercises  Wall ladder x 3 minutes.    Other Pulley Exercises UE ranger on wall x 3 minutes into flexion and circles (CW and CCW).      Modalities   Modalities Primary school teacher Stimulation Location Left shoulder.    Electrical Stimulation Action IFC (non-motoric) 80-150 Hz on 40% scan x 15 minutes.      Vasopneumatic   Number Minutes Vasopneumatic  --    Vasopnuematic Location  --      Manual Therapy   Manual Therapy Passive ROM    Passive ROM PROM x 8 minutes to patient's left shoulder in supine.                       PT Long Term Goals - 07/22/20 1627      PT LONG TERM GOAL #1   Title Independent with a HEP.    Time 8    Period Weeks    Status New      PT LONG TERM GOAL #2   Title Active left shoulder  flexion to 145 degrees so the patient can easily reach overhead.    Time 8    Period Weeks    Status New      PT LONG TERM GOAL #3   Title Active ER to 70 degrees+ to allow for easily donning/doffing of apparel.    Time 8    Period Weeks    Status New      PT LONG TERM GOAL #4   Title Increase left shoulder strength to a solid 4/5 to increase stability for performance of functional activities.    Time 8    Period Weeks    Status New      PT LONG TERM GOAL #5   Title Perform ADL's with pain not > 3-4/10.    Time 8    Period Weeks    Status New                 Plan - 08/11/20 1346    Clinical Impression Statement Patient is progressing very well.  Added supine cane exercises which patient performed very well with excellent technique and no complaints.    Personal Factors and Comorbidities Comorbidity 1;Comorbidity 2    Comorbidities Previous left shoulder surgery, lumbar DDD.    Examination-Activity Limitations Other;Reach Overhead           Patient will benefit from skilled therapeutic intervention in order to improve the following deficits and impairments:     Visit Diagnosis: Chronic left  shoulder pain  Stiffness of left shoulder, not elsewhere classified     Problem List Patient Active Problem List   Diagnosis Date Noted  . Chronic pain syndrome 05/05/2020  . RLS (restless legs syndrome) 04/16/2020  . Chronic venous stasis 01/19/2020  . Weakness 12/22/2019  . Fatigue 12/22/2019  . Chronic bilateral low back pain with bilateral sciatica 11/11/2019  . Elevated blood pressure reading 10/24/2019  . Proteinuria 10/24/2019  . Bilateral lower extremity edema 10/22/2019  . Primary osteoarthritis of right ankle 08/21/2019  . Glossitis 08/14/2019  . Chronic cough 07/23/2019  . Arthralgia 07/23/2019  . Myalgia 06/23/2019  . BMI 35.0-35.9,adult 12/19/2018  . Colon polyp 01/04/2018  . Tobacco abuse 10/12/2016  . Callus 05/08/2016  . Leukocytosis 01/14/2016  . Acquired hypothyroidism 01/14/2016  . Chronic SI joint pain 09/21/2015  . Bilateral carpal tunnel syndrome 05/03/2015  . DDD (degenerative disc disease), cervical 05/03/2015  . Lumbar disc herniation with radiculopathy 05/03/2015  . Left shoulder pain 03/01/2015  . Osteoarthritis of left glenohumeral joint 04/30/2014  . Hyperlipidemia 07/09/2013  . GERD (gastroesophageal reflux disease) 07/09/2013  . DDD (degenerative disc disease), lumbosacral 07/09/2013  . Bipolar 1 disorder ([place]) 07/09/2013    Cashis Rill, [place] MPT 08/11/2020, 2:01 PM  [facility] [address] [place], [place], [address] Phone: [phone]   Fax:  [phone]  Name: Amber Rice MRN: [id number] Date of Birth: May 27, 1956

## 2020-08-16 ENCOUNTER — Ambulatory Visit: Payer: Medicare Other | Admitting: Physical Therapy

## 2020-08-16 ENCOUNTER — Other Ambulatory Visit: Payer: Self-pay

## 2020-08-16 ENCOUNTER — Encounter: Payer: Self-pay | Admitting: Physical Therapy

## 2020-08-16 DIAGNOSIS — M25512 Pain in left shoulder: Secondary | ICD-10-CM | POA: Diagnosis not present

## 2020-08-16 DIAGNOSIS — M25612 Stiffness of left shoulder, not elsewhere classified: Secondary | ICD-10-CM

## 2020-08-16 DIAGNOSIS — G8929 Other chronic pain: Secondary | ICD-10-CM

## 2020-08-16 NOTE — Therapy (Signed)
[name] [place], [place], [address] Phone: [phone]   Fax:  [phone]  Physical Therapy Treatment  Patient Details  Name: Amber Rice MRN: [id number] Date of Birth: January 30, 1956 Referring Provider (PT): Jasper Loser MD   Encounter Date: 08/16/2020   PT End of Session - 08/16/20 1332    Visit Number 7    Number of Visits 16    Date for PT Re-Evaluation 10/20/20    Authorization Type FOTO AT LEAST EVERY 5TH VISIT.  PROGRESS NOTE AT 10TH VISIT.  KX MODIFIER AFTER 15 VISITS.    PT Start Time 0100    PT Stop Time 0136    PT Time Calculation (min) 36 min    Activity Tolerance Patient tolerated treatment well    Behavior During Therapy WFL for tasks assessed/performed           Past Medical History:  Diagnosis Date  . Degenerative disc disease, lumbar    diagnosed in 2005  . Hyperlipidemia   . Impacted cerumen of left ear 04/23/2017    Past Surgical History:  Procedure Laterality Date  . ABDOMINAL HYSTERECTOMY    . APPENDECTOMY    . JOINT REPLACEMENT    . LAPAROTOMY    . TONSILLECTOMY      There were no vitals filed for this visit.   Subjective Assessment - 08/16/20 1320    Subjective COVID-19 screen performed prior to patient entering clinic.  Shoulder was feeling good after last treatment.  She states she massage it quite aggressively over the weekend and her pain rose to about a 9 Sat/Sun.  Pain down today to about a 5.    Pertinent History Previous left shoulder surgery, lumbar DDD.    Patient Stated Goals Use left UE functionally with less pain.    Currently in Pain? Yes    Pain Score 5     Pain Location Shoulder    Pain Orientation Left    Pain Descriptors / Indicators Throbbing    Pain Onset More than a month ago                             [facility] Adult PT Treatment/Exercise - 08/16/20 0001      Shoulder Exercises: Pulleys   Flexion 5 minutes    Other Pulley Exercises  Seated UE Ranger x 5 minutes.      Modalities   Modalities Health visitor Stimulation Location Left shoulder.    Electrical Stimulation Action IFC (non-motoric)    Electrical Stimulation Parameters 80-150 Hz at 40% scan x 20 minutes.    Electrical Stimulation Goals Pain                       PT Long Term Goals - 07/22/20 1627      PT LONG TERM GOAL #1   Title Independent with a HEP.    Time 8    Period Weeks    Status New      PT LONG TERM GOAL #2   Title Active left shoulder flexion to 145 degrees so the patient can easily reach overhead.    Time 8    Period Weeks    Status New      PT LONG TERM GOAL #3   Title Active ER to 70 degrees+ to allow for easily donning/doffing of apparel.    Time  8    Period Weeks    Status New      PT LONG TERM GOAL #4   Title Increase left shoulder strength to a solid 4/5 to increase stability for performance of functional activities.    Time 8    Period Weeks    Status New      PT LONG TERM GOAL #5   Title Perform ADL's with pain not > 3-4/10.    Time 8    Period Weeks    Status New                 Plan - 08/16/20 1330    Clinical Impression Statement The patient had a flare-up over the weekend due to aggressively massaging her left shoulder scar over the weekend.  Treatment was more conservative due to flare-up.  She felt better after treatnment today.    Personal Factors and Comorbidities Comorbidity 1;Comorbidity 2    Comorbidities Previous left shoulder surgery, lumbar DDD.    Examination-Activity Limitations Other;Reach Overhead    Examination-Participation Restrictions Other    Stability/Clinical Decision Making Stable/Uncomplicated    Rehab Potential Excellent    PT Frequency 2x / week    PT Duration 8 weeks    PT Treatment/Interventions ADLs/Self Care Home Management;Cryotherapy;Electrical Stimulation;Moist Heat;Iontophoresis 4mg /ml  Dexamethasone;Therapeutic activities;Therapeutic exercise;Neuromuscular re-education;Manual techniques;Patient/family education;Passive range of motion;Vasopneumatic Device    PT Next Visit Plan AAROM, AROM.    Consulted and Agree with Plan of Care Patient           Patient will benefit from skilled therapeutic intervention in order to improve the following deficits and impairments:  Pain,Increased edema,Decreased activity tolerance,Decreased range of motion  Visit Diagnosis: Chronic left shoulder pain  Stiffness of left shoulder, not elsewhere classified     Problem List Patient Active Problem List   Diagnosis Date Noted  . Chronic pain syndrome 05/05/2020  . RLS (restless legs syndrome) 04/16/2020  . Chronic venous stasis 01/19/2020  . Weakness 12/22/2019  . Fatigue 12/22/2019  . Chronic bilateral low back pain with bilateral sciatica 11/11/2019  . Elevated blood pressure reading 10/24/2019  . Proteinuria 10/24/2019  . Bilateral lower extremity edema 10/22/2019  . Primary osteoarthritis of right ankle 08/21/2019  . Glossitis 08/14/2019  . Chronic cough 07/23/2019  . Arthralgia 07/23/2019  . Myalgia 06/23/2019  . BMI 35.0-35.9,adult 12/19/2018  . Colon polyp 01/04/2018  . Tobacco abuse 10/12/2016  . Callus 05/08/2016  . Leukocytosis 01/14/2016  . Acquired hypothyroidism 01/14/2016  . Chronic SI joint pain 09/21/2015  . Bilateral carpal tunnel syndrome 05/03/2015  . DDD (degenerative disc disease), cervical 05/03/2015  . Lumbar disc herniation with radiculopathy 05/03/2015  . Left shoulder pain 03/01/2015  . Osteoarthritis of left glenohumeral joint 04/30/2014  . Hyperlipidemia 07/09/2013  . GERD (gastroesophageal reflux disease) 07/09/2013  . DDD (degenerative disc disease), lumbosacral 07/09/2013  . Bipolar 1 disorder ([place]) 07/09/2013    Charlie Char, [place] MPT 08/16/2020, 1:42 PM  [facility] [address] [place], [place], [address] Phone: [phone]   Fax:  [phone]  Name: Arden Axon MRN: [id number] Date of Birth: 1956/04/10

## 2020-08-19 ENCOUNTER — Other Ambulatory Visit: Payer: Self-pay

## 2020-08-19 ENCOUNTER — Ambulatory Visit: Payer: Medicare Other | Admitting: *Deleted

## 2020-08-19 DIAGNOSIS — M25612 Stiffness of left shoulder, not elsewhere classified: Secondary | ICD-10-CM

## 2020-08-19 DIAGNOSIS — M25512 Pain in left shoulder: Secondary | ICD-10-CM | POA: Diagnosis not present

## 2020-08-19 DIAGNOSIS — G8929 Other chronic pain: Secondary | ICD-10-CM

## 2020-08-19 NOTE — Therapy (Signed)
[name] [place], [place], [address] Phone: [phone]   Fax:  [phone]  Physical Therapy Treatment  Patient Details  Name: Amber Rice MRN: [id number] Date of Birth: 29-Apr-1956 Referring Provider (PT): Jasper Loser MD   Encounter Date: 08/19/2020   PT End of Session - 08/19/20 0093    Visit Number 8    Number of Visits 16    Date for PT Re-Evaluation 10/20/20    Authorization Type FOTO AT LEAST EVERY 5TH VISIT.  PROGRESS NOTE AT 10TH VISIT.  KX MODIFIER AFTER 15 VISITS.    PT Start Time 1300    PT Stop Time 1349    PT Time Calculation (min) 49 min           Past Medical History:  Diagnosis Date  . Degenerative disc disease, lumbar    diagnosed in 2005  . Hyperlipidemia   . Impacted cerumen of left ear 04/23/2017    Past Surgical History:  Procedure Laterality Date  . ABDOMINAL HYSTERECTOMY    . APPENDECTOMY    . JOINT REPLACEMENT    . LAPAROTOMY    . TONSILLECTOMY      There were no vitals filed for this visit.                      [facility] Adult PT Treatment/Exercise - 08/19/20 0001      Exercises   Exercises Shoulder      Shoulder Exercises: Sidelying   External Rotation Left;AROM;20 reps   cues for technique   Flexion 20 reps;AROM;Left    ABduction Left;20 reps;AROM      Shoulder Exercises: Standing   Flexion AAROM;AROM;Left;10 reps      Shoulder Exercises: Pulleys   Flexion 5 minutes    Other Pulley Exercises Seated UE Ranger x 5 minutes.      Shoulder Exercises: Stretch   Elbow Flexion Strengthening;Left;20 reps   2# wt 2x10     Modalities   Modalities Electrical Stimulation;Vasopneumatic      Electrical Stimulation   Electrical Stimulation Location Left shoulder.    Electrical Stimulation Action IFC    Electrical Stimulation Parameters 80-150hz  x 15 mins    Electrical Stimulation Goals Pain      Manual Therapy   Manual Therapy Passive ROM    Manual  therapy comments AAROM for elevation    Passive ROM Rhythmic stab                       PT Long Term Goals - 07/22/20 1627      PT LONG TERM GOAL #1   Title Independent with a HEP.    Time 8    Period Weeks    Status New      PT LONG TERM GOAL #2   Title Active left shoulder flexion to 145 degrees so the patient can easily reach overhead.    Time 8    Period Weeks    Status New      PT LONG TERM GOAL #3   Title Active ER to 70 degrees+ to allow for easily donning/doffing of apparel.    Time 8    Period Weeks    Status New      PT LONG TERM GOAL #4   Title Increase left shoulder strength to a solid 4/5 to increase stability for performance of functional activities.    Time 8    Period Weeks    Status  New      PT LONG TERM GOAL #5   Title Perform ADL's with pain not > 3-4/10.    Time 8    Period Weeks    Status New                 Plan - 08/19/20 1306    Clinical Impression Statement Pt arrived today doing fairly well with some decreased soreness and pain in LT shldr. She was able to continue with AAROM/ exs with progression of AROM in sideling and did great.    Personal Factors and Comorbidities Comorbidity 1;Comorbidity 2    Comorbidities Previous left shoulder surgery, lumbar DDD.    Examination-Activity Limitations Other;Reach Overhead    Stability/Clinical Decision Making Stable/Uncomplicated    Rehab Potential Excellent    PT Frequency 2x / week    PT Duration 8 weeks    PT Treatment/Interventions ADLs/Self Care Home Management;Cryotherapy;Electrical Stimulation;Moist Heat;Iontophoresis 4mg /ml Dexamethasone;Therapeutic activities;Therapeutic exercise;Neuromuscular re-education;Manual techniques;Patient/family education;Passive range of motion;Vasopneumatic Device    PT Next Visit Plan AAROM, AROM.           Patient will benefit from skilled therapeutic intervention in order to improve the following deficits and impairments:   Pain,Increased edema,Decreased activity tolerance,Decreased range of motion  Visit Diagnosis: Chronic left shoulder pain  Stiffness of left shoulder, not elsewhere classified     Problem List Patient Active Problem List   Diagnosis Date Noted  . Chronic pain syndrome 05/05/2020  . RLS (restless legs syndrome) 04/16/2020  . Chronic venous stasis 01/19/2020  . Weakness 12/22/2019  . Fatigue 12/22/2019  . Chronic bilateral low back pain with bilateral sciatica 11/11/2019  . Elevated blood pressure reading 10/24/2019  . Proteinuria 10/24/2019  . Bilateral lower extremity edema 10/22/2019  . Primary osteoarthritis of right ankle 08/21/2019  . Glossitis 08/14/2019  . Chronic cough 07/23/2019  . Arthralgia 07/23/2019  . Myalgia 06/23/2019  . BMI 35.0-35.9,adult 12/19/2018  . Colon polyp 01/04/2018  . Tobacco abuse 10/12/2016  . Callus 05/08/2016  . Leukocytosis 01/14/2016  . Acquired hypothyroidism 01/14/2016  . Chronic SI joint pain 09/21/2015  . Bilateral carpal tunnel syndrome 05/03/2015  . DDD (degenerative disc disease), cervical 05/03/2015  . Lumbar disc herniation with radiculopathy 05/03/2015  . Left shoulder pain 03/01/2015  . Osteoarthritis of left glenohumeral joint 04/30/2014  . Hyperlipidemia 07/09/2013  . GERD (gastroesophageal reflux disease) 07/09/2013  . DDD (degenerative disc disease), lumbosacral 07/09/2013  . Bipolar 1 disorder ([place]) 07/09/2013    RAMSEUR,CHRIS, PTA 08/19/2020, 2:02 PM  [facility] [place], [place], [address] Phone: [phone]   Fax:  [phone]  Name: Amber Rice MRN: [id number] Date of Birth: 1956-01-08

## 2020-08-23 ENCOUNTER — Ambulatory Visit: Payer: Medicare Other | Admitting: Physician Assistant

## 2020-08-24 ENCOUNTER — Encounter: Payer: Medicare Other | Admitting: Physical Therapy

## 2020-08-26 ENCOUNTER — Other Ambulatory Visit: Payer: Self-pay

## 2020-08-26 ENCOUNTER — Ambulatory Visit: Payer: Medicare Other | Admitting: Physical Therapy

## 2020-08-26 DIAGNOSIS — M25512 Pain in left shoulder: Secondary | ICD-10-CM | POA: Diagnosis not present

## 2020-08-26 DIAGNOSIS — M25612 Stiffness of left shoulder, not elsewhere classified: Secondary | ICD-10-CM

## 2020-08-26 DIAGNOSIS — G8929 Other chronic pain: Secondary | ICD-10-CM

## 2020-08-26 NOTE — Therapy (Signed)
[name] [place], [place], [address] Phone: [phone]   Fax:  [phone]  Physical Therapy Treatment  Patient Details  Name: Amber Rice MRN: [id number] Date of Birth: 1956-03-10 Referring Provider (PT): Jasper Loser MD   Encounter Date: 08/26/2020   PT End of Session - 08/26/20 1347    Visit Number 9    Number of Visits 16    Date for PT Re-Evaluation 10/20/20    Authorization Type FOTO AT LEAST EVERY 5TH VISIT.  PROGRESS NOTE AT 10TH VISIT.  KX MODIFIER AFTER 15 VISITS.    PT Start Time 0100    PT Stop Time 0153    PT Time Calculation (min) 53 min    Activity Tolerance Patient tolerated treatment well    Behavior During Therapy WFL for tasks assessed/performed           Past Medical History:  Diagnosis Date  . Degenerative disc disease, lumbar    diagnosed in 2005  . Hyperlipidemia   . Impacted cerumen of left ear 04/23/2017    Past Surgical History:  Procedure Laterality Date  . ABDOMINAL HYSTERECTOMY    . APPENDECTOMY    . JOINT REPLACEMENT    . LAPAROTOMY    . TONSILLECTOMY      There were no vitals filed for this visit.   Subjective Assessment - 08/26/20 1310    Subjective COVID-19 screen performed prior to patient entering clinic.  pain about a 3 today.    Pertinent History Previous left shoulder surgery, lumbar DDD.    Patient Stated Goals Use left UE functionally with less pain.    Currently in Pain? Yes    Pain Score 3     Pain Location Shoulder    Pain Orientation Left    Pain Descriptors / Indicators Throbbing    Pain Onset More than a month ago                             [facility] Adult PT Treatment/Exercise - 08/26/20 0001      Exercises   Exercises Shoulder      Shoulder Exercises: Seated   Other Seated Exercises Assisted antigravity left shoulder punches, ER at 45 degrees abduction and full can to fatigue x 2.      Shoulder Exercises: Pulleys   Flexion  5 minutes    Other Pulley Exercises Wall ladder x 3 minutes.    Other Pulley Exercises UE Ranger on wall x 3 minutes into flexion and circles (CW and CCW)      Shoulder Exercises: ROM/Strengthening   UBE (Upper Arm Bike) 6 minutes (3 minutes forward and 3 minutes backward) at 120 RPm's.      Modalities   Modalities Health visitor Stimulation Location left shoulder.    Electrical Stimulation Action IFC (non-motoric)  on 40% scan at 80-150 Hz x 20 minutes.      Vasopneumatic   Number Minutes Vasopneumatic  20 minutes    Vasopnuematic Location  --   Left shoulder.   Vasopneumatic Pressure Low                       PT Long Term Goals - 07/22/20 1627      PT LONG TERM GOAL #1   Title Independent with a HEP.    Time 8    Period Weeks  Status New      PT LONG TERM GOAL #2   Title Active left shoulder flexion to 145 degrees so the patient can easily reach overhead.    Time 8    Period Weeks    Status New      PT LONG TERM GOAL #3   Title Active ER to 70 degrees+ to allow for easily donning/doffing of apparel.    Time 8    Period Weeks    Status New      PT LONG TERM GOAL #4   Title Increase left shoulder strength to a solid 4/5 to increase stability for performance of functional activities.    Time 8    Period Weeks    Status New      PT LONG TERM GOAL #5   Title Perform ADL's with pain not > 3-4/10.    Time 8    Period Weeks    Status New                 Plan - 08/26/20 1339    Clinical Impression Statement Patient did great with the addition of low-level UBE stating it felt good.  She also did well with assisted antigravity ther ex and had no complaints.    Personal Factors and Comorbidities Comorbidity 1;Comorbidity 2    Comorbidities Previous left shoulder surgery, lumbar DDD.    Examination-Activity Limitations Other;Reach Overhead    Examination-Participation Restrictions  Other    Stability/Clinical Decision Making Stable/Uncomplicated    Rehab Potential Excellent    PT Frequency 2x / week    PT Duration 8 weeks    PT Treatment/Interventions ADLs/Self Care Home Management;Cryotherapy;Electrical Stimulation;Moist Heat;Iontophoresis 4mg /ml Dexamethasone;Therapeutic activities;Therapeutic exercise;Neuromuscular re-education;Manual techniques;Patient/family education;Passive range of motion;Vasopneumatic Device    PT Next Visit Plan AAROM, AROM.    Consulted and Agree with Plan of Care Patient           Patient will benefit from skilled therapeutic intervention in order to improve the following deficits and impairments:  Pain,Increased edema,Decreased activity tolerance,Decreased range of motion  Visit Diagnosis: Chronic left shoulder pain  Stiffness of left shoulder, not elsewhere classified     Problem List Patient Active Problem List   Diagnosis Date Noted  . Chronic pain syndrome 05/05/2020  . RLS (restless legs syndrome) 04/16/2020  . Chronic venous stasis 01/19/2020  . Weakness 12/22/2019  . Fatigue 12/22/2019  . Chronic bilateral low back pain with bilateral sciatica 11/11/2019  . Elevated blood pressure reading 10/24/2019  . Proteinuria 10/24/2019  . Bilateral lower extremity edema 10/22/2019  . Primary osteoarthritis of right ankle 08/21/2019  . Glossitis 08/14/2019  . Chronic cough 07/23/2019  . Arthralgia 07/23/2019  . Myalgia 06/23/2019  . BMI 35.0-35.9,adult 12/19/2018  . Colon polyp 01/04/2018  . Tobacco abuse 10/12/2016  . Callus 05/08/2016  . Leukocytosis 01/14/2016  . Acquired hypothyroidism 01/14/2016  . Chronic SI joint pain 09/21/2015  . Bilateral carpal tunnel syndrome 05/03/2015  . DDD (degenerative disc disease), cervical 05/03/2015  . Lumbar disc herniation with radiculopathy 05/03/2015  . Left shoulder pain 03/01/2015  . Osteoarthritis of left glenohumeral joint 04/30/2014  . Hyperlipidemia 07/09/2013  . GERD  (gastroesophageal reflux disease) 07/09/2013  . DDD (degenerative disc disease), lumbosacral 07/09/2013  . Bipolar 1 disorder ([place]) 07/09/2013    Besse Miron, [place] MPT 08/26/2020, 2:09 PM  [facility] [address] [place], [place], [address] Phone: [phone]   Fax:  [phone]  Name: Leeba Barbe MRN: [id number] Date  of Birth: [birth year]

## 2020-08-29 ENCOUNTER — Other Ambulatory Visit: Payer: Self-pay | Admitting: Physician Assistant

## 2020-08-30 ENCOUNTER — Telehealth: Payer: Self-pay | Admitting: Neurology

## 2020-08-30 ENCOUNTER — Telehealth: Payer: Self-pay | Admitting: General Practice

## 2020-08-30 ENCOUNTER — Other Ambulatory Visit: Payer: Self-pay

## 2020-08-30 ENCOUNTER — Ambulatory Visit: Payer: Medicare Other | Admitting: Physical Therapy

## 2020-08-30 DIAGNOSIS — G8929 Other chronic pain: Secondary | ICD-10-CM

## 2020-08-30 DIAGNOSIS — M25512 Pain in left shoulder: Secondary | ICD-10-CM

## 2020-08-30 DIAGNOSIS — M25612 Stiffness of left shoulder, not elsewhere classified: Secondary | ICD-10-CM

## 2020-08-30 DIAGNOSIS — E039 Hypothyroidism, unspecified: Secondary | ICD-10-CM

## 2020-08-30 NOTE — Telephone Encounter (Signed)
Patient left vm stating she is coming to the lab 09/08/2020 and needs order for TSH. Order placed. LMOM letting patient know.

## 2020-08-30 NOTE — Therapy (Signed)
[name] [place], [place], [address] Phone: [phone]   Fax:  [phone]  Physical Therapy Treatment  Patient Details  Name: Amber Rice MRN: [id number] Date of Birth: [birth year] Referring Provider (PT): Jasper Loser MD   Encounter Date: 08/30/2020   PT End of Session - 08/30/20 1304    Visit Number 10    Number of Visits 16    Date for PT Re-Evaluation 10/20/20    Authorization Type FOTO AT LEAST EVERY 5TH VISIT.  PROGRESS NOTE AT 10TH VISIT.  KX MODIFIER AFTER 15 VISITS.    PT Start Time 0100    PT Stop Time 0150    PT Time Calculation (min) 50 min    Activity Tolerance Patient tolerated treatment well    Behavior During Therapy WFL for tasks assessed/performed           Past Medical History:  Diagnosis Date  . Degenerative disc disease, lumbar    diagnosed in 2005  . Hyperlipidemia   . Impacted cerumen of left ear 04/23/2017    Past Surgical History:  Procedure Laterality Date  . ABDOMINAL HYSTERECTOMY    . APPENDECTOMY    . JOINT REPLACEMENT    . LAPAROTOMY    . TONSILLECTOMY      There were no vitals filed for this visit.   Subjective Assessment - 08/30/20 1303    Subjective COVID-19 screen performed prior to patient entering clinic.  Doing good.    Pertinent History Previous left shoulder surgery, lumbar DDD.    Patient Stated Goals Use left UE functionally with less pain.    Currently in Pain? Yes    Pain Score 5     Pain Location Shoulder    Pain Orientation Left    Pain Descriptors / Indicators Throbbing    Pain Type Chronic pain    Pain Onset More than a month ago                             [facility] Adult PT Treatment/Exercise - 08/30/20 0001      Exercises   Exercises Shoulder      Shoulder Exercises: Seated   Other Seated Exercises Assisted antigravity left shoulder punches, ER and full can to fatigue x 3.      Shoulder Exercises: Pulleys   Other Pulley  Exercises UE ranger x 4 minutes.    Other Pulley Exercises Ball on wall x 3 minutes.      Shoulder Exercises: ROM/Strengthening   UBE (Upper Arm Bike) 8 minute at 120 RPM's.      Modalities   Modalities Health visitor Stimulation Location Left shoulder.    Electrical Stimulation Action IFC on constant 80-150 Hz x 20 minutes.    Electrical Stimulation Goals Pain      Vasopneumatic   Number Minutes Vasopneumatic  20 minutes    Vasopnuematic Location  --   LT shld with pillow between thorax and LT elbow.                      PT Long Term Goals - 08/30/20 1334      PT LONG TERM GOAL #1   Title Independent with a HEP.    Time 8    Period Weeks    Status On-going      PT LONG TERM GOAL #2   Title Active  left shoulder flexion to 145 degrees so the patient can easily reach overhead.    Time 8    Period Weeks    Status On-going      PT LONG TERM GOAL #3   Title Active ER to 70 degrees+ to allow for easily donning/doffing of apparel.    Time 8    Period Weeks    Status On-going      PT LONG TERM GOAL #4   Title Increase left shoulder strength to a solid 4/5 to increase stability for performance of functional activities.    Time 8    Period Weeks    Status On-going                 Plan - 08/30/20 1333    Clinical Impression Statement See "Therapy Note" section.    Personal Factors and Comorbidities Comorbidity 1;Comorbidity 2    Comorbidities Previous left shoulder surgery, lumbar DDD.    Examination-Activity Limitations Other;Reach Overhead    Stability/Clinical Decision Making Stable/Uncomplicated    Rehab Potential Excellent    PT Frequency 2x / week    PT Duration 8 weeks    PT Treatment/Interventions ADLs/Self Care Home Management;Cryotherapy;Electrical Stimulation;Moist Heat;Iontophoresis 4mg /ml Dexamethasone;Therapeutic activities;Therapeutic exercise;Neuromuscular  re-education;Manual techniques;Patient/family education;Passive range of motion;Vasopneumatic Device    PT Next Visit Plan AAROM, AROM.    Consulted and Agree with Plan of Care Patient           Patient will benefit from skilled therapeutic intervention in order to improve the following deficits and impairments:  Pain,Increased edema,Decreased activity tolerance,Decreased range of motion  Visit Diagnosis: Chronic left shoulder pain  Stiffness of left shoulder, not elsewhere classified     Problem List Patient Active Problem List   Diagnosis Date Noted  . Chronic pain syndrome 05/05/2020  . RLS (restless legs syndrome) 04/16/2020  . Chronic venous stasis 01/19/2020  . Weakness 12/22/2019  . Fatigue 12/22/2019  . Chronic bilateral low back pain with bilateral sciatica 11/11/2019  . Elevated blood pressure reading 10/24/2019  . Proteinuria 10/24/2019  . Bilateral lower extremity edema 10/22/2019  . Primary osteoarthritis of right ankle 08/21/2019  . Glossitis 08/14/2019  . Chronic cough 07/23/2019  . Arthralgia 07/23/2019  . Myalgia 06/23/2019  . BMI 35.0-35.9,adult 12/19/2018  . Colon polyp 01/04/2018  . Tobacco abuse 10/12/2016  . Callus 05/08/2016  . Leukocytosis 01/14/2016  . Acquired hypothyroidism 01/14/2016  . Chronic SI joint pain 09/21/2015  . Bilateral carpal tunnel syndrome 05/03/2015  . DDD (degenerative disc disease), cervical 05/03/2015  . Lumbar disc herniation with radiculopathy 05/03/2015  . Left shoulder pain 03/01/2015  . Osteoarthritis of left glenohumeral joint 04/30/2014  . Hyperlipidemia 07/09/2013  . GERD (gastroesophageal reflux disease) 07/09/2013  . DDD (degenerative disc disease), lumbosacral 07/09/2013  . Bipolar 1 disorder ([place]) 07/09/2013   Progress Note Reporting Period 07/22/20 to 08/30/20  See note below for Objective Data and Assessment of Progress/Goals. Patient has been making very good progress.  She has now progress to [facility]  antigravity there ex. Goals are ongoing at this time.    Macaila Tahir, [place] MPT 08/30/2020, 2:03 PM  [facility] [address] [place], [place], [address] Phone: [phone]   Fax:  [phone]  Name: Carmin Alvidrez MRN: [id number] Date of Birth: [birth year]

## 2020-09-02 ENCOUNTER — Encounter: Payer: Medicare Other | Admitting: Physical Therapy

## 2020-09-03 ENCOUNTER — Other Ambulatory Visit: Payer: Self-pay

## 2020-09-03 ENCOUNTER — Ambulatory Visit: Payer: Medicare Other | Admitting: Physical Therapy

## 2020-09-03 DIAGNOSIS — G8929 Other chronic pain: Secondary | ICD-10-CM

## 2020-09-03 DIAGNOSIS — M25512 Pain in left shoulder: Secondary | ICD-10-CM

## 2020-09-03 DIAGNOSIS — M25612 Stiffness of left shoulder, not elsewhere classified: Secondary | ICD-10-CM

## 2020-09-03 NOTE — Therapy (Signed)
[name] [place], [place], [address] Phone: [phone]   Fax:  [phone]  Physical Therapy Treatment  Patient Details  Name: Amber Rice MRN: [id number] Date of Birth: 02/22/1956 Referring Provider (PT): Jasper Loser MD   Encounter Date: 09/03/2020   PT End of Session - 09/03/20 1012    Visit Number 11    Number of Visits 16    Date for PT Re-Evaluation 10/20/20    Authorization Type FOTO AT LEAST EVERY 5TH VISIT.  PROGRESS NOTE AT 10TH VISIT.  KX MODIFIER AFTER 15 VISITS.    PT Start Time 0901    PT Stop Time 0959    PT Time Calculation (min) 58 min    Activity Tolerance Patient tolerated treatment well    Behavior During Therapy [facility] for tasks assessed/performed           Past Medical History:  Diagnosis Date  . Degenerative disc disease, lumbar    diagnosed in 2005  . Hyperlipidemia   . Impacted cerumen of left ear 04/23/2017    Past Surgical History:  Procedure Laterality Date  . ABDOMINAL HYSTERECTOMY    . APPENDECTOMY    . JOINT REPLACEMENT    . LAPAROTOMY    . TONSILLECTOMY      There were no vitals filed for this visit.                      [facility] Adult PT Treatment/Exercise - 09/03/20 0001      Exercises   Exercises Shoulder      Shoulder Exercises: Standing   Other Standing Exercises RW 4 with yellow theraband assisted (partial range) to fatigue all direction.    Other Standing Exercises Cone left to shoulder and just above shoulder height to fatigue.      Shoulder Exercises: Pulleys   Other Pulley Exercises UE ranger on wall x 4 minutes.      Shoulder Exercises: ROM/Strengthening   UBE (Upper Arm Bike) 10 minutes at 120 RPM's      Modalities   Modalities Electrical Stimulation;Vasopneumatic      Electrical Stimulation   Electrical Stimulation Location Left lev Scap area.    Electrical Stimulation Action Pre-mod.    Electrical Stimulation Parameters 80-150  Hz x 20 minutes.    Electrical Stimulation Goals Pain      Vasopneumatic   Number Minutes Vasopneumatic  20 minutes    Vasopnuematic Location  --   Left shoulder.   Vasopneumatic Pressure Low      Manual Therapy   Manual Therapy Soft tissue mobilization    Soft tissue mobilization STW/M x 5 minutes to reduce pain and tone in patient's left Levator Scapulae muscle.                  PT Education - 09/03/20 0944    Education Details R$ with yellow theraband.  Band provided for home use.    Person(s) Educated Patient    Methods Explanation;Demonstration;Tactile cues;Verbal cues;Handout    Comprehension Verbalized understanding;Returned demonstration;Need further instruction               PT Long Term Goals - 08/30/20 1334      PT LONG TERM GOAL #1   Title Independent with a HEP.    Time 8    Period Weeks    Status On-going      PT LONG TERM GOAL #2   Title Active left shoulder flexion to 145 degrees so  the patient can easily reach overhead.    Time 8    Period Weeks    Status On-going      PT LONG TERM GOAL #3   Title Active ER to 70 degrees+ to allow for easily donning/doffing of apparel.    Time 8    Period Weeks    Status On-going      PT LONG TERM GOAL #4   Title Increase left shoulder strength to a solid 4/5 to increase stability for performance of functional activities.    Time 8    Period Weeks    Status On-going                 Plan - 09/03/20 1015    Clinical Impression Statement COVID-19 screen performed prior to patient entering clinic.    Great job today with low-level resisted left shoulder exercises.  Aboe to lift light cones just above shoulder height today.  TP in left levator Scapulae muscle responded very well to STW/M.    Personal Factors and Comorbidities Comorbidity 1;Comorbidity 2    Comorbidities Previous left shoulder surgery, lumbar DDD.    Examination-Activity Limitations Other;Reach Overhead    Examination-Participation  Restrictions Other    Stability/Clinical Decision Making Stable/Uncomplicated    Rehab Potential Excellent    PT Frequency 2x / week    PT Duration 8 weeks    PT Treatment/Interventions ADLs/Self Care Home Management;Cryotherapy;Electrical Stimulation;Moist Heat;Iontophoresis 4mg /ml Dexamethasone;Therapeutic activities;Therapeutic exercise;Neuromuscular re-education;Manual techniques;Patient/family education;Passive range of motion;Vasopneumatic Device    PT Next Visit Plan AAROM, AROM.    Consulted and Agree with Plan of Care Patient           Patient will benefit from skilled therapeutic intervention in order to improve the following deficits and impairments:  Pain,Increased edema,Decreased activity tolerance,Decreased range of motion  Visit Diagnosis: Chronic left shoulder pain  Stiffness of left shoulder, not elsewhere classified     Problem List Patient Active Problem List   Diagnosis Date Noted  . Chronic pain syndrome 05/05/2020  . RLS (restless legs syndrome) 04/16/2020  . Chronic venous stasis 01/19/2020  . Weakness 12/22/2019  . Fatigue 12/22/2019  . Chronic bilateral low back pain with bilateral sciatica 11/11/2019  . Elevated blood pressure reading 10/24/2019  . Proteinuria 10/24/2019  . Bilateral lower extremity edema 10/22/2019  . Primary osteoarthritis of right ankle 08/21/2019  . Glossitis 08/14/2019  . Chronic cough 07/23/2019  . Arthralgia 07/23/2019  . Myalgia 06/23/2019  . BMI 35.0-35.9,adult 12/19/2018  . Colon polyp 01/04/2018  . Tobacco abuse 10/12/2016  . Callus 05/08/2016  . Leukocytosis 01/14/2016  . Acquired hypothyroidism 01/14/2016  . Chronic SI joint pain 09/21/2015  . Bilateral carpal tunnel syndrome 05/03/2015  . DDD (degenerative disc disease), cervical 05/03/2015  . Lumbar disc herniation with radiculopathy 05/03/2015  . Left shoulder pain 03/01/2015  . Osteoarthritis of left glenohumeral joint 04/30/2014  . Hyperlipidemia  07/09/2013  . GERD (gastroesophageal reflux disease) 07/09/2013  . DDD (degenerative disc disease), lumbosacral 07/09/2013  . Bipolar 1 disorder ([place]) 07/09/2013    Mickala Laton, [place] MPT 09/03/2020, 10:19 AM  [facility] [place], [place], [address] Phone: [phone]   Fax:  [phone]  Name: Amber Rice MRN: [id number] Date of Birth: 17-Jul-1956

## 2020-09-06 ENCOUNTER — Other Ambulatory Visit: Payer: Self-pay

## 2020-09-06 ENCOUNTER — Encounter: Payer: Self-pay | Admitting: Physical Therapy

## 2020-09-06 ENCOUNTER — Ambulatory Visit: Payer: Medicare Other | Admitting: Physical Therapy

## 2020-09-06 DIAGNOSIS — G8929 Other chronic pain: Secondary | ICD-10-CM

## 2020-09-06 DIAGNOSIS — M25612 Stiffness of left shoulder, not elsewhere classified: Secondary | ICD-10-CM

## 2020-09-06 DIAGNOSIS — M25512 Pain in left shoulder: Secondary | ICD-10-CM | POA: Diagnosis not present

## 2020-09-06 NOTE — Therapy (Signed)
[name] [address] [place], [place], [address] Phone: [phone]   Fax:  [phone]  Physical Therapy Treatment  Patient Details  Name: Amber Rice MRN: [id number] Date of Birth: 12/20/1955 Referring Provider (PT): Linus SalmonsJames Starman MD   Encounter Date: 09/06/2020   PT End of Session - 09/06/20 1306    Visit Number 12    Number of Visits 16    Date for PT Re-Evaluation 10/20/20    Authorization Type FOTO AT LEAST EVERY 5TH VISIT.  PROGRESS NOTE AT 10TH VISIT.  KX MODIFIER AFTER 15 VISITS.    PT Start Time 1302    PT Stop Time 1347    PT Time Calculation (min) 45 min    Activity Tolerance Patient tolerated treatment well    Behavior During Therapy WFL for tasks assessed/performed           Past Medical History:  Diagnosis Date  . Degenerative disc disease, lumbar    diagnosed in 2005  . Hyperlipidemia   . Impacted cerumen of left ear 04/23/2017    Past Surgical History:  Procedure Laterality Date  . ABDOMINAL HYSTERECTOMY    . APPENDECTOMY    . JOINT REPLACEMENT    . LAPAROTOMY    . TONSILLECTOMY      There were no vitals filed for this visit.   Subjective Assessment - 09/06/20 1303    Subjective COVID-19 screen performed prior to patient entering clinic. Reports that her shoulder is still tender to touch and move at times but she was told by surgeon that it would be better by 4 months post op.    Pertinent History Previous left shoulder surgery, lumbar DDD.    Patient Stated Goals Use left UE functionally with less pain.    Currently in Pain? Other (Comment)   No pain assessment provided             [facility] - 09/06/20 0001      Assessment   Medical Diagnosis Chronic left shoudler pain.    Referring Provider (PT) Linus SalmonsJames Starman MD    Onset Date/Surgical Date 06/03/20    Next MD Visit 10/04/2020      Precautions   Precaution Comments Proceed per reverse total shoulder protocol.                          [facility] Treatment/Exercise - 09/06/20 0001      Shoulder Exercises: Sidelying   External Rotation AROM;Left;20 reps    Flexion AROM;Left;20 reps      Shoulder Exercises: Standing   Extension Strengthening;Both;20 reps;Theraband    Extension Limitations Green XTS    Row Strengthening;Both;20 reps    Row Limitations Green XTS      Shoulder Exercises: Pulleys   Flexion 3 minutes    Other Pulley Exercises LUE jar to low and mid cabinet x10 reps; x10 reps to mid cabinet 1#      Shoulder Exercises: ROM/Strengthening   UBE (Upper Arm Bike) 60 RPM x 8 min      Modalities   Modalities Electrical Stimulation;Vasopneumatic      Electrical Stimulation   Electrical Stimulation Location L shoulder    Electrical Stimulation Action Pre-Mod    Electrical Stimulation Parameters 80-150 hz x15 min    Electrical Stimulation Goals Pain      Vasopneumatic   Number Minutes Vasopneumatic  15 minutes    Vasopnuematic Location  Shoulder    Vasopneumatic Pressure  Low    Vasopneumatic Temperature  34/pain                       PT Long Term Goals - 09/06/20 1340      PT LONG TERM GOAL #1   Title Independent with a HEP.    Time 8    Period Weeks    Status On-going      PT LONG TERM GOAL #2   Title Active left shoulder flexion to 145 degrees so the patient can easily reach overhead.    Time 8    Period Weeks    Status On-going      PT LONG TERM GOAL #3   Title Active ER to 70 degrees+ to allow for easily donning/doffing of apparel.    Time 8    Period Weeks    Status On-going      PT LONG TERM GOAL #4   Title Increase left shoulder strength to a solid 4/5 to increase stability for performance of functional activities.    Time 8    Period Weeks    Status On-going      PT LONG TERM GOAL #5   Title Perform ADL's with pain not > 3-4/10.    Time 8    Period Weeks    Status On-going                 Plan - 09/06/20 1341     Clinical Impression Statement Patient presented in clinic with reports of continued constant discomfort of L shoulder. Patient was told by surgeon that the pain would subside around 4 months postop which will be 11/2020. Patient guided through light scapular training and functional training to the cabinet. Patient reported greatest difficulty being with shoulder flexion. Normal modalities response noted following removal of the modalities.    Personal Factors and Comorbidities Comorbidity 1;Comorbidity 2    Comorbidities Previous left shoulder surgery, lumbar DDD.    Examination-Activity Limitations Other;Reach Overhead    Examination-Participation Restrictions Other    Stability/Clinical Decision Making Stable/Uncomplicated    Rehab Potential Excellent    PT Frequency 2x / week    PT Duration 8 weeks    PT Treatment/Interventions ADLs/Self Care Home Management;Cryotherapy;Electrical Stimulation;Moist Heat;Iontophoresis 4mg /ml Dexamethasone;Therapeutic activities;Therapeutic exercise;Neuromuscular re-education;Manual techniques;Patient/family education;Passive range of motion;Vasopneumatic Device    PT Next Visit Plan AAROM, AROM.    Consulted and Agree with Plan of Care Patient           Patient will benefit from skilled therapeutic intervention in order to improve the following deficits and impairments:  Pain,Increased edema,Decreased activity tolerance,Decreased range of motion  Visit Diagnosis: Chronic left shoulder pain  Stiffness of left shoulder, not elsewhere classified     Problem List Patient Active Problem List   Diagnosis Date Noted  . Chronic pain syndrome 05/05/2020  . RLS (restless legs syndrome) 04/16/2020  . Chronic venous stasis 01/19/2020  . Weakness 12/22/2019  . Fatigue 12/22/2019  . Chronic bilateral low back pain with bilateral sciatica 11/11/2019  . Elevated blood pressure reading 10/24/2019  . Proteinuria 10/24/2019  . Bilateral lower extremity edema  10/22/2019  . Primary osteoarthritis of right ankle 08/21/2019  . Glossitis 08/14/2019  . Chronic cough 07/23/2019  . Arthralgia 07/23/2019  . Myalgia 06/23/2019  . BMI 35.0-35.9,adult 12/19/2018  . Colon polyp 01/04/2018  . Tobacco abuse 10/12/2016  . Callus 05/08/2016  . Leukocytosis 01/14/2016  . Acquired hypothyroidism 01/14/2016  . Chronic SI joint pain  09/21/2015  . Bilateral carpal tunnel syndrome 05/03/2015  . DDD (degenerative disc disease), cervical 05/03/2015  . Lumbar disc herniation with radiculopathy 05/03/2015  . Left shoulder pain 03/01/2015  . Osteoarthritis of left glenohumeral joint 04/30/2014  . Hyperlipidemia 07/09/2013  . GERD (gastroesophageal reflux disease) 07/09/2013  . DDD (degenerative disc disease), lumbosacral 07/09/2013  . Bipolar 1 disorder ([place]) 07/09/2013    Standley Brooking, PTA 09/06/2020, 2:25 PM  [facility] [address] [place], [place], [address] Phone: [phone]   Fax:  [phone]  Name: Amber Rice MRN: [id number] Date of Birth: 01/02/1956

## 2020-09-07 ENCOUNTER — Other Ambulatory Visit: Payer: Self-pay

## 2020-09-07 ENCOUNTER — Ambulatory Visit: Payer: Medicare Other | Attending: Orthopaedic Surgery | Admitting: Physical Therapy

## 2020-09-07 DIAGNOSIS — M25612 Stiffness of left shoulder, not elsewhere classified: Secondary | ICD-10-CM | POA: Diagnosis present

## 2020-09-07 DIAGNOSIS — G8929 Other chronic pain: Secondary | ICD-10-CM | POA: Diagnosis present

## 2020-09-07 DIAGNOSIS — M25512 Pain in left shoulder: Secondary | ICD-10-CM | POA: Insufficient documentation

## 2020-09-07 NOTE — Therapy (Signed)
[name] [place], [place], [address] Phone: [phone]   Fax:  [phone]  Physical Therapy Treatment  Patient Details  Name: Amber Rice MRN: [id number] Date of Birth: 10/29/1955 Referring Provider (PT): Jasper Loser MD   Encounter Date: 09/07/2020   PT End of Session - 09/07/20 1408    Visit Number 13    Number of Visits 16    Date for PT Re-Evaluation 10/20/20    Authorization Type FOTO AT LEAST EVERY 5TH VISIT.  PROGRESS NOTE AT 10TH VISIT.  KX MODIFIER AFTER 15 VISITS.    PT Start Time 0107    PT Stop Time 0202    PT Time Calculation (min) 55 min    Activity Tolerance Patient tolerated treatment well    Behavior During Therapy [facility] for tasks assessed/performed           Past Medical History:  Diagnosis Date  . Degenerative disc disease, lumbar    diagnosed in 2005  . Hyperlipidemia   . Impacted cerumen of left ear 04/23/2017    Past Surgical History:  Procedure Laterality Date  . ABDOMINAL HYSTERECTOMY    . APPENDECTOMY    . JOINT REPLACEMENT    . LAPAROTOMY    . TONSILLECTOMY      There were no vitals filed for this visit.   Subjective Assessment - 09/07/20 1309    Subjective COVID-19 screen performed prior to patient entering clinic.  That massage worked helped.  Patient requesting again.    Pertinent History Previous left shoulder surgery, lumbar DDD.    Patient Stated Goals Use left UE functionally with less pain.    Currently in Pain? Yes    Pain Score 4     Pain Location Shoulder    Pain Orientation Left    Pain Descriptors / Indicators Throbbing    Pain Type Chronic pain    Pain Onset More than a month ago                             [facility] Adult PT Treatment/Exercise - 09/07/20 0001      Exercises   Exercises Shoulder      Shoulder Exercises: Standing   Other Standing Exercises Yellow theraband into left shoulder IR, punches and scap retarction to fatigue  f/b overhead punches with 1#    Other Standing Exercises UE Ranger on wall into flexion and circle (CW and CCW) x 4 minutes.      Shoulder Exercises: ROM/Strengthening   UBE (Upper Arm Bike) 10 minutes at 90 RPM's.      Acupuncturist Location Left scapular region.    Electrical Stimulation Action IFC    Electrical Stimulation Parameters 80-150 Hz, 100% scan x 20 minutes.    Electrical Stimulation Goals Pain;Tone      Manual Therapy   Manual Therapy Scapular mobilization    Soft tissue mobilization STW/M x 9 minutes to patient's left lev scap and medial scapular border region to decrease pain and tone.                       PT Long Term Goals - 09/06/20 1340      PT LONG TERM GOAL #1   Title Independent with a HEP.    Time 8    Period Weeks    Status On-going      PT LONG TERM GOAL #2  Title Active left shoulder flexion to 145 degrees so the patient can easily reach overhead.    Time 8    Period Weeks    Status On-going      PT LONG TERM GOAL #3   Title Active ER to 70 degrees+ to allow for easily donning/doffing of apparel.    Time 8    Period Weeks    Status On-going      PT LONG TERM GOAL #4   Title Increase left shoulder strength to a solid 4/5 to increase stability for performance of functional activities.    Time 8    Period Weeks    Status On-going      PT LONG TERM GOAL #5   Title Perform ADL's with pain not > 3-4/10.    Time 8    Period Weeks    Status On-going                 Plan - 09/07/20 1409    Clinical Impression Statement Patient doing very well overall and is very motivated.  She was able to perform a left overhead punch with 1# today.    Personal Factors and Comorbidities Comorbidity 1;Comorbidity 2    Comorbidities Previous left shoulder surgery, lumbar DDD.    Examination-Activity Limitations Other;Reach Overhead    Examination-Participation Restrictions Other    Stability/Clinical  Decision Making Stable/Uncomplicated    Rehab Potential Excellent    PT Frequency 2x / week    PT Duration 8 weeks    PT Treatment/Interventions ADLs/Self Care Home Management;Cryotherapy;Electrical Stimulation;Moist Heat;Iontophoresis 4mg /ml Dexamethasone;Therapeutic activities;Therapeutic exercise;Neuromuscular re-education;Manual techniques;Patient/family education;Passive range of motion;Vasopneumatic Device    PT Next Visit Plan AAROM, AROM.    Consulted and Agree with Plan of Care Patient           Patient will benefit from skilled therapeutic intervention in order to improve the following deficits and impairments:  Pain,Increased edema,Decreased activity tolerance,Decreased range of motion  Visit Diagnosis: Chronic left shoulder pain  Stiffness of left shoulder, not elsewhere classified     Problem List Patient Active Problem List   Diagnosis Date Noted  . Chronic pain syndrome 05/05/2020  . RLS (restless legs syndrome) 04/16/2020  . Chronic venous stasis 01/19/2020  . Weakness 12/22/2019  . Fatigue 12/22/2019  . Chronic bilateral low back pain with bilateral sciatica 11/11/2019  . Elevated blood pressure reading 10/24/2019  . Proteinuria 10/24/2019  . Bilateral lower extremity edema 10/22/2019  . Primary osteoarthritis of right ankle 08/21/2019  . Glossitis 08/14/2019  . Chronic cough 07/23/2019  . Arthralgia 07/23/2019  . Myalgia 06/23/2019  . BMI 35.0-35.9,adult 12/19/2018  . Colon polyp 01/04/2018  . Tobacco abuse 10/12/2016  . Callus 05/08/2016  . Leukocytosis 01/14/2016  . Acquired hypothyroidism 01/14/2016  . Chronic SI joint pain 09/21/2015  . Bilateral carpal tunnel syndrome 05/03/2015  . DDD (degenerative disc disease), cervical 05/03/2015  . Lumbar disc herniation with radiculopathy 05/03/2015  . Left shoulder pain 03/01/2015  . Osteoarthritis of left glenohumeral joint 04/30/2014  . Hyperlipidemia 07/09/2013  . GERD (gastroesophageal reflux  disease) 07/09/2013  . DDD (degenerative disc disease), lumbosacral 07/09/2013  . Bipolar 1 disorder ([place]) 07/09/2013    Ransom Nickson, [place] MPT 09/07/2020, 2:11 PM  [facility] [address] [place], [place], [address] Phone: [phone]   Fax:  [phone]  Name: Amber Rice MRN: [id number] Date of Birth: December 04, 1955

## 2020-09-08 ENCOUNTER — Encounter: Payer: Medicare Other | Admitting: Physical Therapy

## 2020-09-08 ENCOUNTER — Encounter: Payer: Self-pay | Admitting: Physician Assistant

## 2020-09-08 ENCOUNTER — Ambulatory Visit (INDEPENDENT_AMBULATORY_CARE_PROVIDER_SITE_OTHER): Payer: Medicare Other | Admitting: Physician Assistant

## 2020-09-08 VITALS — BP 128/60 | HR 104 | Ht 65.0 in | Wt 248.0 lb

## 2020-09-08 DIAGNOSIS — M5441 Lumbago with sciatica, right side: Secondary | ICD-10-CM | POA: Diagnosis not present

## 2020-09-08 DIAGNOSIS — M5442 Lumbago with sciatica, left side: Secondary | ICD-10-CM

## 2020-09-08 DIAGNOSIS — M5116 Intervertebral disc disorders with radiculopathy, lumbar region: Secondary | ICD-10-CM

## 2020-09-08 DIAGNOSIS — M5137 Other intervertebral disc degeneration, lumbosacral region: Secondary | ICD-10-CM

## 2020-09-08 DIAGNOSIS — G8929 Other chronic pain: Secondary | ICD-10-CM

## 2020-09-08 MED ORDER — HYDROCODONE-ACETAMINOPHEN 5-325 MG PO TABS: 1.0000 | ORAL_TABLET | Freq: Two times a day (BID) | ORAL | 0 refills | Status: DC | PRN

## 2020-09-08 NOTE — Progress Notes (Signed)
Subjective:    Patient ID: Amber Rice, female    DOB: [birth year], 65 y.o.   MRN: [id number]  HPI  Patient is a 65 year old obese female with hypothyroidism and chronic bilateral low back pain due to herniated disc/lumbar DDD who is here for refill of her pain medication.  She will get her thyroid checked today to make a dose adjustment.  Her back pain is well controlled on Norco twice a day.  She denies any concerns or complaints.  She still has intermittent worsening pain if she does too much.  .. Active Ambulatory Problems    Diagnosis Date Noted  . Hyperlipidemia 07/09/2013  . GERD (gastroesophageal reflux disease) 07/09/2013  . DDD (degenerative disc disease), lumbosacral 07/09/2013  . Bipolar 1 disorder ([place]) 07/09/2013  . Osteoarthritis of left glenohumeral joint 04/30/2014  . Left shoulder pain 03/01/2015  . Bilateral carpal tunnel syndrome 05/03/2015  . DDD (degenerative disc disease), cervical 05/03/2015  . Lumbar disc herniation with radiculopathy 05/03/2015  . Chronic SI joint pain 09/21/2015  . Leukocytosis 01/14/2016  . Acquired hypothyroidism 01/14/2016  . Callus 05/08/2016  . Tobacco abuse 10/12/2016  . Colon polyp 01/04/2018  . BMI 35.0-35.9,adult 12/19/2018  . Myalgia 06/23/2019  . Chronic cough 07/23/2019  . Arthralgia 07/23/2019  . Glossitis 08/14/2019  . Primary osteoarthritis of right ankle 08/21/2019  . Bilateral lower extremity edema 10/22/2019  . Elevated blood pressure reading 10/24/2019  . Proteinuria 10/24/2019  . Chronic bilateral low back pain with bilateral sciatica 11/11/2019  . Weakness 12/22/2019  . Fatigue 12/22/2019  . Chronic venous stasis 01/19/2020  . RLS (restless legs syndrome) 04/16/2020  . Chronic pain syndrome 05/05/2020   Resolved Ambulatory Problems    Diagnosis Date Noted  . Low back pain with sciatica 07/09/2013  . Impacted cerumen of left ear 04/23/2017   Past Medical History:  Diagnosis Date  . Degenerative  disc disease, lumbar        Review of Systems  All other systems reviewed and are negative.      Objective:   Physical Exam Vitals reviewed.  Constitutional:      Appearance: Normal appearance. She is obese.  Cardiovascular:     Rate and Rhythm: Normal rate and regular rhythm.     Pulses: Normal pulses.  Pulmonary:     Effort: Pulmonary effort is normal.     Breath sounds: Normal breath sounds.  Neurological:     General: No focal deficit present.     Mental Status: She is alert and oriented to person, place, and time.  Psychiatric:        Mood and Affect: Mood normal.           Assessment & Plan:  Marland KitchenMarland KitchenChristyne was seen today for follow-up.  Diagnoses and all orders for this visit:  Chronic bilateral low back pain with bilateral sciatica -     HYDROcodone-acetaminophen (NORCO/VICODIN) 5-325 MG tablet; Take 1 tablet by mouth every 12 (twelve) hours as needed for moderate pain. -     HYDROcodone-acetaminophen (NORCO/VICODIN) 5-325 MG tablet; Take 1 tablet by mouth every 12 (twelve) hours as needed for moderate pain. -     HYDROcodone-acetaminophen (NORCO/VICODIN) 5-325 MG tablet; Take 1 tablet by mouth every 12 (twelve) hours as needed for moderate pain.  DDD (degenerative disc disease), lumbosacral -     HYDROcodone-acetaminophen (NORCO/VICODIN) 5-325 MG tablet; Take 1 tablet by mouth every 12 (twelve) hours as needed for moderate pain. -     HYDROcodone-acetaminophen (NORCO/VICODIN) 5-325  MG tablet; Take 1 tablet by mouth every 12 (twelve) hours as needed for moderate pain. -     HYDROcodone-acetaminophen (NORCO/VICODIN) 5-325 MG tablet; Take 1 tablet by mouth every 12 (twelve) hours as needed for moderate pain.  Lumbar disc herniation with radiculopathy -     HYDROcodone-acetaminophen (NORCO/VICODIN) 5-325 MG tablet; Take 1 tablet by mouth every 12 (twelve) hours as needed for moderate pain. -     HYDROcodone-acetaminophen (NORCO/VICODIN) 5-325 MG tablet; Take 1  tablet by mouth every 12 (twelve) hours as needed for moderate pain. -     HYDROcodone-acetaminophen (NORCO/VICODIN) 5-325 MG tablet; Take 1 tablet by mouth every 12 (twelve) hours as needed for moderate pain.   Marland Kitchen.PDMP reviewed during this encounter. No concerns.  Refilled for 3 months.  UTD pain contract and UDS.  Follow up in 3 months.

## 2020-09-09 ENCOUNTER — Encounter: Payer: Self-pay | Admitting: Physician Assistant

## 2020-09-09 ENCOUNTER — Other Ambulatory Visit: Payer: Self-pay | Admitting: Neurology

## 2020-09-09 DIAGNOSIS — E039 Hypothyroidism, unspecified: Secondary | ICD-10-CM

## 2020-09-09 LAB — TSH: TSH: 4.11 mIU/L (ref 0.40–4.50)

## 2020-09-09 NOTE — Telephone Encounter (Signed)
Thyroid is better and in normal range but not quite to optimal range. I would like to increase just a bit more and see if we can get you to more optimal 1-2 level. Are you ok with this? Recheck labs in 2 months.

## 2020-09-10 ENCOUNTER — Encounter: Payer: Self-pay | Admitting: Physician Assistant

## 2020-09-10 MED ORDER — LEVOTHYROXINE SODIUM 50 MCG PO TABS: 50.0000 ug | ORAL_TABLET | Freq: Every day | ORAL | 1 refills | Status: DC

## 2020-09-13 ENCOUNTER — Ambulatory Visit: Payer: Medicare Other | Admitting: Physical Therapy

## 2020-09-16 ENCOUNTER — Other Ambulatory Visit: Payer: Self-pay

## 2020-09-16 ENCOUNTER — Ambulatory Visit: Payer: Medicare Other | Admitting: Physical Therapy

## 2020-09-16 DIAGNOSIS — G8929 Other chronic pain: Secondary | ICD-10-CM

## 2020-09-16 DIAGNOSIS — M25512 Pain in left shoulder: Secondary | ICD-10-CM

## 2020-09-16 DIAGNOSIS — M25612 Stiffness of left shoulder, not elsewhere classified: Secondary | ICD-10-CM

## 2020-09-16 NOTE — Therapy (Signed)
[name] [place], [place], [address] Phone: [phone]   Fax:  [phone]  Physical Therapy Treatment  Patient Details  Name: Amber Rice MRN: [id number] Date of Birth: 01-Oct-1955 Referring Provider (PT): Jasper Loser MD   Encounter Date: 09/16/2020   PT End of Session - 09/16/20 1306    Visit Number 14    Number of Visits 16    Date for PT Re-Evaluation 10/20/20    Authorization Type FOTO 14th 44%  PROGRESS NOTE AT 10TH VISIT.  KX MODIFIER AFTER 15 VISITS.    PT Start Time 0103    PT Stop Time 0150    PT Time Calculation (min) 47 min    Activity Tolerance Patient tolerated treatment well    Behavior During Therapy WFL for tasks assessed/performed           Past Medical History:  Diagnosis Date  . Degenerative disc disease, lumbar    diagnosed in 2005  . Hyperlipidemia   . Impacted cerumen of left ear 04/23/2017    Past Surgical History:  Procedure Laterality Date  . ABDOMINAL HYSTERECTOMY    . APPENDECTOMY    . JOINT REPLACEMENT    . LAPAROTOMY    . TONSILLECTOMY      There were no vitals filed for this visit.   Subjective Assessment - 09/16/20 1305    Subjective COVID-19 screen performed prior to patient entering clinic.  Patient arrived with some ongoing discomfort in shoulder.    Pertinent History Previous left shoulder surgery, lumbar DDD.    Patient Stated Goals Use left UE functionally with less pain.    Currently in Pain? Yes    Pain Score 3     Pain Location Shoulder    Pain Orientation Left    Pain Descriptors / Indicators Discomfort    Pain Type Chronic pain    Pain Onset More than a month ago    Pain Frequency Constant    Aggravating Factors  movement    Pain Relieving Factors rest              [facility] PT Assessment - 09/16/20 0001      ROM / Strength   AROM / PROM / Strength AROM      AROM   AROM Assessment Site Shoulder    Right/Left Shoulder Left    Left Shoulder  Flexion 147 Degrees    Left Shoulder External Rotation 60 Degrees                         [facility] Adult PT Treatment/Exercise - 09/16/20 0001      Shoulder Exercises: Seated   Other Seated Exercises seated flexion, scaption and abd x10 each    Other Seated Exercises seated hitch hiker x20      Shoulder Exercises: Standing   Protraction Strengthening;Left;10 reps;Theraband    Theraband Level (Shoulder Protraction) Level 1 (Yellow)    External Rotation Strengthening;Left;10 reps;Theraband    Theraband Level (Shoulder External Rotation) Level 1 (Yellow)    Internal Rotation Strengthening;Left;10 reps;Theraband    Theraband Level (Shoulder Internal Rotation) Level 1 (Yellow)    Retraction Strengthening;Left;10 reps;Theraband    Theraband Level (Shoulder Retraction) Level 1 (Yellow)    Other Standing Exercises standing wall slides with eccentric lowering x fatigue      Shoulder Exercises: ROM/Strengthening   UBE (Upper Arm Bike) 10 min 90 RPM's.      Shoulder Exercises: Isometric Strengthening  External Rotation Other (comment)   5seconds x15 reps     Electrical Stimulation   Electrical Stimulation Location Left scapular region.    Electrical Stimulation Action IFC    Electrical Stimulation Parameters 80-'150hz'  x97mn    Electrical Stimulation Goals Pain;Tone      Vasopneumatic   Number Minutes Vasopneumatic  15 minutes    Vasopnuematic Location  Shoulder    Vasopneumatic Pressure Low    Vasopneumatic Temperature  34/pain                       PT Long Term Goals - 09/16/20 1308      PT LONG TERM GOAL #1   Title Independent with a HEP.    Baseline Doing well with at home per reported 09/16/20    Time 8    Period Weeks    Status Achieved      PT LONG TERM GOAL #2   Title Active left shoulder flexion to 145 degrees so the patient can easily reach overhead.    Baseline AROM 147 degrees 09/16/20    Time 8    Period Weeks    Status Achieved       PT LONG TERM GOAL #3   Title Active ER to 70 degrees+ to allow for easily donning/doffing of apparel.    Baseline AROM 60 degrees 09/1020    Time 8    Period Weeks    Status On-going      PT LONG TERM GOAL #4   Title Increase left shoulder strength to a solid 4/5 to increase stability for performance of functional activities.    Time 8    Period Weeks    Status On-going      PT LONG TERM GOAL #5   Title Perform ADL's with pain not > 3-4/10.    Baseline Limited with ADL's due to pain and weakness 09/16/20    Time 8    Period Weeks    Status On-going                 Plan - 09/16/20 1339    Clinical Impression Statement Patient tolerated treatment well today. Patient continues to have pai in left shoulder yet overall improvement per reported. Patient has improved with AROM and able to reach overhead with greater ease. Patient limited with some ADL's due to pain and weakness. Today reviewed HEP with patient and doing well yet required educatonal cues for technique. Patient met LTG#1 and #2 today with remaining goals progressing.    Personal Factors and Comorbidities Social Background    Comorbidities Previous left shoulder surgery, lumbar DDD.    Examination-Activity Limitations Other;Reach Overhead    Examination-Participation Restrictions Other    Stability/Clinical Decision Making Stable/Uncomplicated    Rehab Potential Excellent    PT Frequency 2x / week    PT Duration 8 weeks    PT Treatment/Interventions ADLs/Self Care Home Management;Cryotherapy;Electrical Stimulation;Moist Heat;Iontophoresis 468mml Dexamethasone;Therapeutic activities;Therapeutic exercise;Neuromuscular re-education;Manual techniques;Patient/family education;Passive range of motion;Vasopneumatic Device    PT Next Visit Plan cont with POC for strengthening.    Consulted and Agree with Plan of Care Patient           Patient will benefit from skilled therapeutic intervention in order to improve the  following deficits and impairments:  Pain,Increased edema,Decreased activity tolerance,Decreased range of motion  Visit Diagnosis: Chronic left shoulder pain  Stiffness of left shoulder, not elsewhere classified     Problem List Patient Active Problem List  Diagnosis Date Noted  . Chronic pain syndrome 05/05/2020  . RLS (restless legs syndrome) 04/16/2020  . Chronic venous stasis 01/19/2020  . Weakness 12/22/2019  . Fatigue 12/22/2019  . Chronic bilateral low back pain with bilateral sciatica 11/11/2019  . Elevated blood pressure reading 10/24/2019  . Proteinuria 10/24/2019  . Bilateral lower extremity edema 10/22/2019  . Primary osteoarthritis of right ankle 08/21/2019  . Glossitis 08/14/2019  . Chronic cough 07/23/2019  . Arthralgia 07/23/2019  . Myalgia 06/23/2019  . BMI 35.0-35.9,adult 12/19/2018  . Colon polyp 01/04/2018  . Tobacco abuse 10/12/2016  . Callus 05/08/2016  . Leukocytosis 01/14/2016  . Acquired hypothyroidism 01/14/2016  . Chronic SI joint pain 09/21/2015  . Bilateral carpal tunnel syndrome 05/03/2015  . DDD (degenerative disc disease), cervical 05/03/2015  . Lumbar disc herniation with radiculopathy 05/03/2015  . Left shoulder pain 03/01/2015  . Osteoarthritis of left glenohumeral joint 04/30/2014  . Hyperlipidemia 07/09/2013  . GERD (gastroesophageal reflux disease) 07/09/2013  . DDD (degenerative disc disease), lumbosacral 07/09/2013  . Bipolar 1 disorder ([place]) 07/09/2013    Ayvion Kavanagh P, PTA 09/16/2020, 1:53 PM  [facility] [place], [place], [address] Phone: [phone]   Fax:  [phone]  Name: Amber Rice MRN: [id number] Date of Birth: 1956/02/14

## 2020-09-20 ENCOUNTER — Other Ambulatory Visit: Payer: Self-pay

## 2020-09-20 ENCOUNTER — Ambulatory Visit: Payer: Medicare Other | Admitting: Physical Therapy

## 2020-09-20 DIAGNOSIS — M25612 Stiffness of left shoulder, not elsewhere classified: Secondary | ICD-10-CM

## 2020-09-20 DIAGNOSIS — G8929 Other chronic pain: Secondary | ICD-10-CM

## 2020-09-20 DIAGNOSIS — M25512 Pain in left shoulder: Secondary | ICD-10-CM | POA: Diagnosis not present

## 2020-09-20 NOTE — Therapy (Signed)
[name] [place], [place], [address] Phone: [phone]   Fax:  [phone]  Physical Therapy Treatment  Patient Details  Name: Amber Rice MRN: [id number] Date of Birth: 08-11-1955 Referring Provider (PT): Jasper Loser MD   Encounter Date: 09/20/2020   PT End of Session - 09/20/20 1336    Visit Number 15    Number of Visits 16    Date for PT Re-Evaluation 10/20/20    Authorization Type FOTO 14th 44%  PROGRESS NOTE AT 10TH VISIT.  KX MODIFIER AFTER 15 VISITS.    PT Start Time 0102    PT Stop Time 0150    PT Time Calculation (min) 48 min    Activity Tolerance Patient tolerated treatment well    Behavior During Therapy WFL for tasks assessed/performed           Past Medical History:  Diagnosis Date  . Degenerative disc disease, lumbar    diagnosed in 2005  . Hyperlipidemia   . Impacted cerumen of left ear 04/23/2017    Past Surgical History:  Procedure Laterality Date  . ABDOMINAL HYSTERECTOMY    . APPENDECTOMY    . JOINT REPLACEMENT    . LAPAROTOMY    . TONSILLECTOMY      There were no vitals filed for this visit.   Subjective Assessment - 09/20/20 1306    Subjective COVID-19 screen performed prior to patient entering clinic. Pain about a 3 today.    Pertinent History Previous left shoulder surgery, lumbar DDD.    Patient Stated Goals Use left UE functionally with less pain.    Currently in Pain? Yes    Pain Score 3     Pain Location Shoulder    Pain Orientation Left    Pain Descriptors / Indicators Discomfort    Pain Type Chronic pain    Pain Onset More than a month ago                             [facility] Adult PT Treatment/Exercise - 09/20/20 0001      Exercises   Exercises Shoulder      Shoulder Exercises: Seated   Other Seated Exercises Seated left shoulder full can to fatigue x 2.      Shoulder Exercises: Standing   Other Standing Exercises Assisted RW4 with yellow  theraband to fatigue into al motions.    Other Standing Exercises UE ranger on wall x 4 minutes into flexion and circles CW and CCW.      Shoulder Exercises: ROM/Strengthening   UBE (Upper Arm Bike) 10 minutes      Electrical Stimulation   Electrical Stimulation Location left shoulder.    Electrical Stimulation Action IFC    Electrical Stimulation Parameters 80-150 Hz x 20 minutes.    Electrical Stimulation Goals Tone;Pain      Vasopneumatic   Number Minutes Vasopneumatic  20 minutes    Vasopnuematic Location  --   Left shoulder.   Vasopneumatic Pressure Low                       PT Long Term Goals - 09/16/20 1308      PT LONG TERM GOAL #1   Title Independent with a HEP.    Baseline Doing well with at home per reported 09/16/20    Time 8    Period Weeks    Status Achieved  PT LONG TERM GOAL #2   Title Active left shoulder flexion to 145 degrees so the patient can easily reach overhead.    Baseline AROM 147 degrees 09/16/20    Time 8    Period Weeks    Status Achieved      PT LONG TERM GOAL #3   Title Active ER to 70 degrees+ to allow for easily donning/doffing of apparel.    Baseline AROM 60 degrees 09/1020    Time 8    Period Weeks    Status On-going      PT LONG TERM GOAL #4   Title Increase left shoulder strength to a solid 4/5 to increase stability for performance of functional activities.    Time 8    Period Weeks    Status On-going      PT LONG TERM GOAL #5   Title Perform ADL's with pain not > 3-4/10.    Baseline Limited with ADL's due to pain and weakness 09/16/20    Time 8    Period Weeks    Status On-going                 Plan - 09/20/20 1335    Clinical Impression Statement Overall, patientis very pleased with her progress.  She is highly motivated.  Pain at a 3 today.  Discharge this week.    Personal Factors and Comorbidities Social Background    Comorbidities Previous left shoulder surgery, lumbar DDD.     Examination-Activity Limitations Other;Reach Overhead    Examination-Participation Restrictions Other    Stability/Clinical Decision Making Stable/Uncomplicated    Rehab Potential Excellent    PT Frequency 2x / week    PT Duration 8 weeks    PT Treatment/Interventions ADLs/Self Care Home Management;Cryotherapy;Electrical Stimulation;Moist Heat;Iontophoresis 4mg /ml Dexamethasone;Therapeutic activities;Therapeutic exercise;Neuromuscular re-education;Manual techniques;Patient/family education;Passive range of motion;Vasopneumatic Device    PT Next Visit Plan cont with POC for strengthening.    Consulted and Agree with Plan of Care Patient           Patient will benefit from skilled therapeutic intervention in order to improve the following deficits and impairments:  Pain,Increased edema,Decreased activity tolerance,Decreased range of motion  Visit Diagnosis: Chronic left shoulder pain  Stiffness of left shoulder, not elsewhere classified     Problem List Patient Active Problem List   Diagnosis Date Noted  . Chronic pain syndrome 05/05/2020  . RLS (restless legs syndrome) 04/16/2020  . Chronic venous stasis 01/19/2020  . Weakness 12/22/2019  . Fatigue 12/22/2019  . Chronic bilateral low back pain with bilateral sciatica 11/11/2019  . Elevated blood pressure reading 10/24/2019  . Proteinuria 10/24/2019  . Bilateral lower extremity edema 10/22/2019  . Primary osteoarthritis of right ankle 08/21/2019  . Glossitis 08/14/2019  . Chronic cough 07/23/2019  . Arthralgia 07/23/2019  . Myalgia 06/23/2019  . BMI 35.0-35.9,adult 12/19/2018  . Colon polyp 01/04/2018  . Tobacco abuse 10/12/2016  . Callus 05/08/2016  . Leukocytosis 01/14/2016  . Acquired hypothyroidism 01/14/2016  . Chronic SI joint pain 09/21/2015  . Bilateral carpal tunnel syndrome 05/03/2015  . DDD (degenerative disc disease), cervical 05/03/2015  . Lumbar disc herniation with radiculopathy 05/03/2015  . Left  shoulder pain 03/01/2015  . Osteoarthritis of left glenohumeral joint 04/30/2014  . Hyperlipidemia 07/09/2013  . GERD (gastroesophageal reflux disease) 07/09/2013  . DDD (degenerative disc disease), lumbosacral 07/09/2013  . Bipolar 1 disorder ([place]) 07/09/2013    Christino Mcglinchey, [place] MPT 09/20/2020, 1:53 PM  [name] [address]  [place], [place], [address] Phone: [phone]   Fax:  [phone]  Name: Lillis Nuttle MRN: [id number] Date of Birth: Nov 20, 1955

## 2020-09-21 ENCOUNTER — Other Ambulatory Visit (HOSPITAL_COMMUNITY): Payer: Self-pay

## 2020-09-21 MED ORDER — LITHIUM CARBONATE ER 300 MG PO TBCR: 300.0000 mg | EXTENDED_RELEASE_TABLET | Freq: Two times a day (BID) | ORAL | 0 refills | Status: DC

## 2020-09-21 MED ORDER — SERTRALINE HCL 100 MG PO TABS: 100.0000 mg | ORAL_TABLET | Freq: Two times a day (BID) | ORAL | 0 refills | Status: DC

## 2020-09-22 ENCOUNTER — Ambulatory Visit: Payer: Medicare Other | Admitting: Physical Therapy

## 2020-09-22 ENCOUNTER — Other Ambulatory Visit: Payer: Self-pay

## 2020-09-22 DIAGNOSIS — M25512 Pain in left shoulder: Secondary | ICD-10-CM

## 2020-09-22 DIAGNOSIS — M25612 Stiffness of left shoulder, not elsewhere classified: Secondary | ICD-10-CM

## 2020-09-22 DIAGNOSIS — G8929 Other chronic pain: Secondary | ICD-10-CM

## 2020-09-22 NOTE — Therapy (Signed)
[name] [place], [place], [address] Phone: [phone]   Fax:  [phone]  Physical Therapy Treatment  Patient Details  Name: Amber Rice MRN: [id number] Date of Birth: Apr 12, 1956 Referring Provider (PT): Amber Loser MD   Encounter Date: 09/22/2020   PT End of Session - 09/22/20 1023    Visit Number 16    Number of Visits 16    Date for PT Re-Evaluation 10/20/20    PT Start Time 0905    PT Stop Time 0951    PT Time Calculation (min) 46 min    Activity Tolerance Patient tolerated treatment well    Behavior During Therapy [facility] for tasks assessed/performed           Past Medical History:  Diagnosis Date  . Degenerative disc disease, lumbar    diagnosed in 2005  . Hyperlipidemia   . Impacted cerumen of left ear 04/23/2017    Past Surgical History:  Procedure Laterality Date  . ABDOMINAL HYSTERECTOMY    . APPENDECTOMY    . JOINT REPLACEMENT    . LAPAROTOMY    . TONSILLECTOMY      There were no vitals filed for this visit.   Subjective Assessment - 09/22/20 1020    Subjective COVID-19 screen performed prior to patient entering clinic.  Tired today, shoulder pain woke patient last night.  Okay with light treatment today.    Pertinent History Previous left shoulder surgery, lumbar DDD.    Currently in Pain? Yes    Pain Location Shoulder    Pain Orientation Left    Pain Descriptors / Indicators Discomfort    Pain Type Chronic pain    Pain Onset More than a month ago                             [facility] Adult PT Treatment/Exercise - 09/22/20 0001      Exercises   Exercises Shoulder      Shoulder Exercises: Standing   Other Standing Exercises UE ranger on wall x 4 minutes.      Shoulder Exercises: ROM/Strengthening   UBE (Upper Arm Bike) 10 minutes.      Acupuncturist Location LT shoulder.    Electrical Stimulation Action IFC at 80-150 Hz x 20  minutes.    Electrical Stimulation Parameters 40% scan.    Electrical Stimulation Goals Tone;Pain      Vasopneumatic   Number Minutes Vasopneumatic  20 minutes    Vasopnuematic Location  --   LT shoulder.   Vasopneumatic Pressure Low                       PT Long Term Goals - 09/22/20 0949      PT LONG TERM GOAL #1   Title Independent with a HEP.    Baseline Doing well with at home per reported 09/16/20    Time 8    Period Weeks    Status Achieved      PT LONG TERM GOAL #2   Title Active left shoulder flexion to 145 degrees so the patient can easily reach overhead.    Baseline AROM 147 degrees 09/16/20    Time 8    Period Weeks    Status Achieved      PT LONG TERM GOAL #3   Title Active ER to 70 degrees+ to allow for easily donning/doffing of apparel.  Baseline 75 degrees.    Time 8    Period Weeks    Status Achieved      PT LONG TERM GOAL #4   Title Increase left shoulder strength to a solid 4/5 to increase stability for performance of functional activities.    Baseline Deltoid strength is a solid 4/5.    Time 8    Period Weeks    Status Achieved      PT LONG TERM GOAL #5   Title Perform ADL's with pain not > 3-4/10.    Baseline Most days.    Period Weeks    Status Partially Met                 Plan - 09/22/20 1027    Clinical Impression Statement See "Therapy Note" section.    Personal Factors and Comorbidities Social Background    Comorbidities Previous left shoulder surgery, lumbar DDD.    Examination-Activity Limitations Other;Reach Overhead    Examination-Participation Restrictions Other    Rehab Potential Excellent    PT Frequency 2x / week    PT Duration 8 weeks    PT Treatment/Interventions ADLs/Self Care Home Management;Cryotherapy;Electrical Stimulation;Moist Heat;Iontophoresis 76m/ml Dexamethasone;Therapeutic activities;Therapeutic exercise;Neuromuscular re-education;Manual techniques;Patient/family education;Passive range of  motion;Vasopneumatic Device    PT Next Visit Plan cont with POC for strengthening.    Consulted and Agree with Plan of Care Patient           Patient will benefit from skilled therapeutic intervention in order to improve the following deficits and impairments:  Pain,Increased edema,Decreased activity tolerance,Decreased range of motion  Visit Diagnosis: Chronic left shoulder pain  Stiffness of left shoulder, not elsewhere classified     Problem List Patient Active Problem List   Diagnosis Date Noted  . Chronic pain syndrome 05/05/2020  . RLS (restless legs syndrome) 04/16/2020  . Chronic venous stasis 01/19/2020  . Weakness 12/22/2019  . Fatigue 12/22/2019  . Chronic bilateral low back pain with bilateral sciatica 11/11/2019  . Elevated blood pressure reading 10/24/2019  . Proteinuria 10/24/2019  . Bilateral lower extremity edema 10/22/2019  . Primary osteoarthritis of right ankle 08/21/2019  . Glossitis 08/14/2019  . Chronic cough 07/23/2019  . Arthralgia 07/23/2019  . Myalgia 06/23/2019  . BMI 35.0-35.9,adult 12/19/2018  . Colon polyp 01/04/2018  . Tobacco abuse 10/12/2016  . Callus 05/08/2016  . Leukocytosis 01/14/2016  . Acquired hypothyroidism 01/14/2016  . Chronic SI joint pain 09/21/2015  . Bilateral carpal tunnel syndrome 05/03/2015  . DDD (degenerative disc disease), cervical 05/03/2015  . Lumbar disc herniation with radiculopathy 05/03/2015  . Left shoulder pain 03/01/2015  . Osteoarthritis of left glenohumeral joint 04/30/2014  . Hyperlipidemia 07/09/2013  . GERD (gastroesophageal reflux disease) 07/09/2013  . DDD (degenerative disc disease), lumbosacral 07/09/2013  . Bipolar 1 disorder ([place] 07/09/2013   PHYSICAL THERAPY DISCHARGE SUMMARY  Visits from Start of Care: 16.  Current functional level related to goals / functional outcomes: See above.   Remaining deficits: See goal section.   Education / Equipment: HEP. Plan: Patient agrees to  discharge.  Patient goals were partially met. Patient is being discharged due to being pleased with the current functional level.  ?????     Amber Rice, CMaliMPT 09/22/2020, 10:29 AM  [facility] [address]Phone: [phone]  Fax:  [phone] Name: Amber KubaMRN: [id number] of Birth: 308-21-1957

## 2020-09-24 NOTE — Telephone Encounter (Signed)
Documentation only.

## 2020-10-14 ENCOUNTER — Other Ambulatory Visit: Payer: Self-pay

## 2020-10-14 ENCOUNTER — Encounter (HOSPITAL_COMMUNITY): Payer: Self-pay | Admitting: Emergency Medicine

## 2020-10-14 ENCOUNTER — Emergency Department (HOSPITAL_COMMUNITY): Payer: Medicare Other

## 2020-10-14 ENCOUNTER — Emergency Department (HOSPITAL_COMMUNITY)
Admission: EM | Admit: 2020-10-14 | Discharge: 2020-10-14 | Disposition: A | Discharge status: Home / Self Care | Payer: Medicare Other | Attending: Emergency Medicine | Admitting: Emergency Medicine

## 2020-10-14 DIAGNOSIS — Z79899 Other long term (current) drug therapy: Secondary | ICD-10-CM | POA: Diagnosis not present

## 2020-10-14 DIAGNOSIS — F17210 Nicotine dependence, cigarettes, uncomplicated: Secondary | ICD-10-CM | POA: Diagnosis not present

## 2020-10-14 DIAGNOSIS — R0781 Pleurodynia: Secondary | ICD-10-CM | POA: Diagnosis present

## 2020-10-14 DIAGNOSIS — Z96698 Presence of other orthopedic joint implants: Secondary | ICD-10-CM | POA: Insufficient documentation

## 2020-10-14 DIAGNOSIS — E039 Hypothyroidism, unspecified: Secondary | ICD-10-CM | POA: Insufficient documentation

## 2020-10-14 DIAGNOSIS — R0789 Other chest pain: Secondary | ICD-10-CM

## 2020-10-14 LAB — COMPREHENSIVE METABOLIC PANEL
ALT: 25 U/L (ref 0–44)
AST: 19 U/L (ref 15–41)
Albumin: 3.4 g/dL — ABNORMAL LOW (ref 3.5–5.0)
Alkaline Phosphatase: 93 U/L (ref 38–126)
Anion gap: 8 (ref 5–15)
BUN: 16 mg/dL (ref 8–23)
CO2: 23 mmol/L (ref 22–32)
Calcium: 8.9 mg/dL (ref 8.9–10.3)
Chloride: 105 mmol/L (ref 98–111)
Creatinine, Ser: 0.91 mg/dL (ref 0.44–1.00)
GFR, Estimated: >60 mL/min (ref >60)
Glucose, Bld: 106 mg/dL — ABNORMAL HIGH (ref 70–99)
Potassium: 4.2 mmol/L (ref 3.5–5.1)
Sodium: 136 mmol/L (ref 135–145)
Total Bilirubin: 0.3 mg/dL (ref 0.3–1.2)
Total Protein: 6.9 g/dL (ref 6.5–8.1)

## 2020-10-14 LAB — LIPASE, BLOOD: Lipase: 29 U/L (ref 11–51)

## 2020-10-14 LAB — CBC WITH DIFFERENTIAL/PLATELET
Abs Immature Granulocytes: 0.16 10*3/uL — ABNORMAL HIGH (ref 0.00–0.07)
Basophils Absolute: 0.1 10*3/uL (ref 0.0–0.1)
Basophils Relative: 1 %
Eosinophils Absolute: 0.4 10*3/uL (ref 0.0–0.5)
Eosinophils Relative: 3 %
HCT: 42.6 % (ref 36.0–46.0)
Hemoglobin: 13.5 g/dL (ref 12.0–15.0)
Immature Granulocytes: 1 %
Lymphocytes Relative: 13 %
Lymphs Abs: 1.8 10*3/uL (ref 0.7–4.0)
MCH: 32 pg (ref 26.0–34.0)
MCHC: 31.7 g/dL (ref 30.0–36.0)
MCV: 100.9 fL — ABNORMAL HIGH (ref 80.0–100.0)
Monocytes Absolute: 0.8 10*3/uL (ref 0.1–1.0)
Monocytes Relative: 6 %
Neutro Abs: 10.5 10*3/uL — ABNORMAL HIGH (ref 1.7–7.7)
Neutrophils Relative %: 76 %
Platelets: 404 10*3/uL — ABNORMAL HIGH (ref 150–400)
RBC: 4.22 MIL/uL (ref 3.87–5.11)
RDW: 13.2 % (ref 11.5–15.5)
WBC: 13.7 10*3/uL — ABNORMAL HIGH (ref 4.0–10.5)
nRBC: 0 % (ref 0.0–0.2)

## 2020-10-14 MED ORDER — IOHEXOL 350 MG/ML SOLN
100.0000 mL | Freq: Once | INTRAVENOUS | Status: AC | PRN
  Administered 2020-10-14: 100 mL via INTRAVENOUS

## 2020-10-14 MED ORDER — ONDANSETRON HCL 4 MG/2ML IJ SOLN
4.0000 mg | Freq: Once | INTRAMUSCULAR | Status: AC
  Administered 2020-10-14: 4 mg via INTRAVENOUS
  Filled 2020-10-14: qty 2

## 2020-10-14 MED ORDER — HYDROMORPHONE HCL 1 MG/ML IJ SOLN
1.0000 mg | Freq: Once | INTRAMUSCULAR | Status: AC
  Administered 2020-10-14: 1 mg via INTRAVENOUS
  Filled 2020-10-14: qty 1

## 2020-10-14 NOTE — ED Provider Notes (Signed)
[facility] EMERGENCY DEPARTMENT Provider Note   CSN: [id number] Arrival date & time: 10/14/20  1014     History Chief Complaint  Patient presents with  . rib pain    Amber Rice is a 65 y.o. female.  Patient with acute onset of quite severe spasmodic type pain in the right lateral rib area.  It started around 8:00 this morning.  It comes in spasms.  Worse with taking a deep breath or coughing.  Oxygen saturations on room air 97%.  Patient states she felt fine yesterday.  No anterior chest pain.  Patient's past medical history significant for hyperlipidemia degenerative disc disease.        Past Medical History:  Diagnosis Date  . Degenerative disc disease, lumbar    diagnosed in 2005  . Hyperlipidemia   . Impacted cerumen of left ear 04/23/2017    Patient Active Problem List   Diagnosis Date Noted  . Chronic pain syndrome 05/05/2020  . RLS (restless legs syndrome) 04/16/2020  . Chronic venous stasis 01/19/2020  . Weakness 12/22/2019  . Fatigue 12/22/2019  . Chronic bilateral low back pain with bilateral sciatica 11/11/2019  . Elevated blood pressure reading 10/24/2019  . Proteinuria 10/24/2019  . Bilateral lower extremity edema 10/22/2019  . Primary osteoarthritis of right ankle 08/21/2019  . Glossitis 08/14/2019  . Chronic cough 07/23/2019  . Arthralgia 07/23/2019  . Myalgia 06/23/2019  . BMI 35.0-35.9,adult 12/19/2018  . Colon polyp 01/04/2018  . Tobacco abuse 10/12/2016  . Callus 05/08/2016  . Leukocytosis 01/14/2016  . Acquired hypothyroidism 01/14/2016  . Chronic SI joint pain 09/21/2015  . Bilateral carpal tunnel syndrome 05/03/2015  . DDD (degenerative disc disease), cervical 05/03/2015  . Lumbar disc herniation with radiculopathy 05/03/2015  . Left shoulder pain 03/01/2015  . Osteoarthritis of left glenohumeral joint 04/30/2014  . Hyperlipidemia 07/09/2013  . GERD (gastroesophageal reflux disease) 07/09/2013  . DDD (degenerative disc disease),  lumbosacral 07/09/2013  . Bipolar 1 disorder ([place]) 07/09/2013    Past Surgical History:  Procedure Laterality Date  . ABDOMINAL HYSTERECTOMY    . APPENDECTOMY    . JOINT REPLACEMENT    . LAPAROTOMY    . TONSILLECTOMY       OB History   No obstetric history on file.     Family History  Problem Relation Age of Onset  . Cancer Mother   . Heart attack Father   . Hyperlipidemia Father   . Cancer Maternal Grandmother   . Cancer Paternal Grandmother   . Bipolar disorder Paternal Uncle        Committed suicide in his 65's  . Dementia Paternal Uncle   . Depression Cousin   . Schizophrenia Cousin   . Multiple sclerosis Sister   . Breast cancer Cousin   . Alcohol abuse Neg Hx   . Anxiety disorder Neg Hx     Social History   Tobacco Use  . Smoking status: Current Every Day Smoker    Packs/day: 1.00    Years: 49.00    Pack years: 49.00    Types: Cigarettes    Last attempt to quit: 10/30/2016    Years since quitting: 3.9  . Smokeless tobacco: Never Used  Vaping Use  . Vaping Use: Never used  Substance Use Topics  . Alcohol use: No  . Drug use: Never    Comment: Caffeine: Coffee 1-2 cups  per day. 24 Ounce tea, 32-48 ounces pepsI    Home Medications Prior to Admission medications   Medication  Sig Start Date End Date Taking? Authorizing Provider  furosemide (LASIX) 20 MG tablet Take 20 mg by mouth daily as needed for fluid.   Yes [provider]  HYDROcodone-acetaminophen (NORCO/VICODIN) 5-325 MG tablet Take 1 tablet by mouth every 12 (twelve) hours as needed for moderate pain. 11/04/20  Yes Breeback, Jade L, PA-C  levothyroxine (SYNTHROID) 50 MCG tablet Take 1 tablet (50 mcg total) by mouth daily before breakfast. 09/10/20  Yes Breeback, Jade L, PA-C  lithium carbonate (LITHOBID) 300 MG CR tablet Take 1 tablet (300 mg total) by mouth 2 (two) times daily. Patient taking differently: Take 600 mg by mouth daily. 09/21/20  Yes [name], MD  rOPINIRole (REQUIP)  0.25 MG tablet TAKE 1-4 TABLETS BY MOUTH 1 HOUR BEFORE BED FOR RESTLESS LEG. Patient taking differently: Take 0.25-4 mg by mouth at bedtime as needed (restless leg). 05/07/20  Yes Breeback, Jade L, PA-C  sertraline (ZOLOFT) 100 MG tablet Take 1 tablet (100 mg total) by mouth 2 (two) times daily. Patient taking differently: Take 200 mg by mouth daily. 09/21/20  Yes [name], MD  HYDROcodone-acetaminophen (NORCO/VICODIN) 5-325 MG tablet Take 1 tablet by mouth every 12 (twelve) hours as needed for moderate pain. Patient not taking: No sig reported 09/08/20   Iran Planas L, PA-C  HYDROcodone-acetaminophen (NORCO/VICODIN) 5-325 MG tablet Take 1 tablet by mouth every 12 (twelve) hours as needed for moderate pain. Patient not taking: No sig reported 10/05/20   Iran Planas L, PA-C    Allergies    Lipitor [atorvastatin]  Review of Systems   Review of Systems  Constitutional: Negative for chills and fever.  HENT: Negative for congestion, rhinorrhea and sore throat.   Eyes: Negative for visual disturbance.  Respiratory: Negative for cough and shortness of breath.   Cardiovascular: Positive for chest pain. Negative for leg swelling.  Gastrointestinal: Negative for abdominal pain, diarrhea, nausea and vomiting.  Genitourinary: Negative for dysuria.  Musculoskeletal: Negative for back pain and neck pain.  Skin: Negative for rash.  Neurological: Negative for dizziness, light-headedness and headaches.  Hematological: Does not bruise/bleed easily.  Psychiatric/Behavioral: Negative for confusion.    Physical Exam Updated Vital Signs BP 132/82   Pulse 84   Temp 98.3 F (36.8 C) (Oral)   Resp 14   Ht 1.651 m (5\' 5" )   Wt 108.9 kg   SpO2 94%   BMI 39.94 kg/m   Physical Exam Vitals and nursing note reviewed.  Constitutional:      General: She is not in acute distress.    Appearance: Normal appearance. She is well-developed.  HENT:     Head: Normocephalic and atraumatic.  Eyes:      Extraocular Movements: Extraocular movements intact.     Conjunctiva/sclera: Conjunctivae normal.     Pupils: Pupils are equal, round, and reactive to light.  Cardiovascular:     Rate and Rhythm: Normal rate and regular rhythm.     Heart sounds: No murmur heard.   Pulmonary:     Effort: Pulmonary effort is normal. No respiratory distress.     Breath sounds: Normal breath sounds.     Comments: Chest is not tender to palpation on the right lateral aspect where the pain is.  Making rib fracture unlikely Chest:     Chest wall: No tenderness.  Abdominal:     Palpations: Abdomen is soft.     Tenderness: There is no abdominal tenderness.  Musculoskeletal:        General: No swelling. Normal range  of motion.     Cervical back: Normal range of motion and neck supple.  Skin:    General: Skin is warm and dry.     Capillary Refill: Capillary refill takes less than 2 seconds.  Neurological:     General: No focal deficit present.     Mental Status: She is alert and oriented to person, place, and time.     Cranial Nerves: No cranial nerve deficit.     Sensory: No sensory deficit.     Motor: No weakness.     Coordination: Coordination normal.     ED Results / Procedures / Treatments   Labs (all labs ordered are listed, but only abnormal results are displayed) Labs Reviewed  CBC WITH DIFFERENTIAL/PLATELET - Abnormal; Notable for the following components:      Result Value   WBC 13.7 (*)    MCV 100.9 (*)    Platelets 404 (*)    Neutro Abs 10.5 (*)    Abs Immature Granulocytes 0.16 (*)    All other components within normal limits  COMPREHENSIVE METABOLIC PANEL - Abnormal; Notable for the following components:   Glucose, Bld 106 (*)    Albumin 3.4 (*)    All other components within normal limits  LIPASE, BLOOD    EKG EKG Interpretation  Date/Time:  Thursday October 14 2020 12:52:05 EST Ventricular Rate:  75 PR Interval:    QRS Duration: 96 QT Interval:  415 QTC  Calculation: 464 R Axis:   77 Text Interpretation: Sinus rhythm Low voltage, precordial leads Confirmed by Fredia Sorrow [phone]) on 10/14/2020 1:49:06 PM   Radiology DG Chest Port 1 View  Result Date: 10/14/2020 CLINICAL DATA:  Chest pain and dyspnea EXAM: PORTABLE CHEST 1 VIEW COMPARISON:  05/09/2017 chest radiograph. FINDINGS: Partially visualized left total shoulder arthroplasty. Stable cardiomediastinal silhouette with top normal heart size. No pneumothorax. No pleural effusion. No overt pulmonary edema. Mild left basilar atelectasis. IMPRESSION: Mild left basilar atelectasis.  Otherwise no active disease. Electronically Signed   By: Ilona Sorrel M.D.   On: 10/14/2020 12:19    Procedures Procedures   Medications Ordered in ED Medications  ondansetron (ZOFRAN) injection 4 mg (4 mg Intravenous Given 10/14/20 1229)  HYDROmorphone (DILAUDID) injection 1 mg (1 mg Intravenous Given 10/14/20 1234)    ED Course  I have reviewed the triage vital signs and the nursing notes.  Pertinent labs & imaging results that were available during my care of the patient were reviewed by me and considered in my medical decision making (see chart for details).    MDM Rules/Calculators/A&P                          Patient's pain was pretty significant.  There was no tenderness to palpation over the area where the pain was making rib fracture unlikely.  Chest x-ray is negative labs significant for mild leukocytosis with a white blood cell count of 13,000.  Otherwise labs without any significant abnormalities.  EKG without any acute changes.  Patient's pain symptoms are not consistent with an acute cardiac event at all.  Very spasmodic in nature.  Patient treated with hydromorphone and Zofran with improvement in the pain.  Patient more comfortable.  But since pain is very pleuritic in nature.  After the pain medicine patient did have a drop in her oxygen saturations and she was started on some oxygen.  But  prior to that her oxygen sats were fine.  Feels probably pain medicine related.  But since chest wall pain with negative chest x-ray had not tender to palpation we will go ahead and do CT angio chest to rule out pulmonary embolus.  Since the pain is very pleuritic in nature.  If CT angios negative would just treat with short course of some pain control and follow-up with her doctors.    Final Clinical Impression(s) / ED Diagnoses Final diagnoses:  Chest wall pain    Rx / DC Orders ED Discharge Orders    None       Fredia Sorrow, MD 10/14/20 1415

## 2020-10-14 NOTE — ED Notes (Signed)
Patient transported to CT 

## 2020-10-14 NOTE — Discharge Instructions (Signed)
Continue taking your pain medicine.  And follow-up with your doctor next week.  You do not have any blood clots in your lung

## 2020-10-14 NOTE — ED Notes (Signed)
Pt. Ambulated to restroom with steady gait.  

## 2020-10-14 NOTE — ED Triage Notes (Signed)
Pt states having upper rib pain starting an hour ago, worsening with inhalation and coughing. Pt denies any injuries

## 2020-10-15 ENCOUNTER — Telehealth: Payer: Self-pay | Admitting: *Deleted

## 2020-10-15 NOTE — Telephone Encounter (Signed)
Transition Care Management Unsuccessful Follow-up Telephone Call  Date of discharge and from where:  10/14/2020 - [facility]  Attempts:  1st Attempt  Reason for unsuccessful TCM follow-up call:  No answer/busy

## 2020-10-18 ENCOUNTER — Ambulatory Visit (INDEPENDENT_AMBULATORY_CARE_PROVIDER_SITE_OTHER): Payer: Medicare Other | Admitting: Physician Assistant

## 2020-10-18 ENCOUNTER — Encounter: Payer: Self-pay | Admitting: Physician Assistant

## 2020-10-18 ENCOUNTER — Other Ambulatory Visit: Payer: Self-pay

## 2020-10-18 ENCOUNTER — Ambulatory Visit (INDEPENDENT_AMBULATORY_CARE_PROVIDER_SITE_OTHER): Payer: Medicare Other

## 2020-10-18 VITALS — BP 132/114 | HR 88 | Ht 65.0 in | Wt 246.0 lb

## 2020-10-18 DIAGNOSIS — R198 Other specified symptoms and signs involving the digestive system and abdomen: Secondary | ICD-10-CM | POA: Diagnosis not present

## 2020-10-18 DIAGNOSIS — D72829 Elevated white blood cell count, unspecified: Secondary | ICD-10-CM

## 2020-10-18 DIAGNOSIS — R140 Abdominal distension (gaseous): Secondary | ICD-10-CM

## 2020-10-18 DIAGNOSIS — K760 Fatty (change of) liver, not elsewhere classified: Secondary | ICD-10-CM | POA: Diagnosis not present

## 2020-10-18 DIAGNOSIS — R109 Unspecified abdominal pain: Secondary | ICD-10-CM

## 2020-10-18 DIAGNOSIS — R1011 Right upper quadrant pain: Secondary | ICD-10-CM

## 2020-10-18 MED ORDER — IOHEXOL 300 MG/ML  SOLN
100.0000 mL | Freq: Once | INTRAMUSCULAR | Status: AC | PRN
  Administered 2020-10-18: 100 mL via INTRAVENOUS

## 2020-10-18 MED ORDER — METHOCARBAMOL 500 MG PO TABS: 500.0000 mg | ORAL_TABLET | Freq: Three times a day (TID) | ORAL | 0 refills | Status: DC

## 2020-10-18 NOTE — Progress Notes (Signed)
CT shows: Appendix normal- you still have it.  Gallbladder normal.  No kidney stones. Constipation. You need to take miralax 1 capful in 4 oz of juice twice daily until stooling regularly.  Fatty liver. No cause for your severe abdominal pain unless it is the constipation and gas.  Pain medication is likely constipating you.  Lets see if miralax help.  Would you like to try a muscle relaxer as well?

## 2020-10-18 NOTE — Addendum Note (Signed)
Addended by: Donella Stade on: 10/18/2020 04:50 PM   Modules accepted: Orders

## 2020-10-18 NOTE — Progress Notes (Signed)
Subjective:    Patient ID: Amber Rice, female    DOB: 02-Apr-1956, 65 y.o.   MRN: [id number]  HPI  Pt is a 65 yo obese female who presents to the clinic to follow up from hospital for right flank and chest wall pain on 10/14/2020.   HPI from ED:   Patient with acute onset of quite severe spasmodic type pain in the right lateral rib area.  It started around 8:00 this morning.  It comes in spasms.  Worse with taking a deep breath or coughing.  Oxygen saturations on room air 97%.  Patient states she felt fine yesterday.  No anterior chest pain.  Patient's past medical history significant for hyperlipidemia degenerative disc disease.  CXR normal.  Mild, leukocytosis.normal lipase. Normal cmp.  EKG without any significant findings.  CTA negative for PE. Fatty enlarged liver on CTA.  Given hydromorphone and zofran left ED out of pain.   She continues to have right flank pain and radiates into right upper and lower abdomen. No recent prednisone or antibiotic usage. No appendix. No bowel changes. No fever, chills, body aches. She denies any acid reflux, melena or hematochezia. She has no appetite. Movement still seems to be the things that makes the worse. No change in pain with food. No nausea and vomiting. No urinary symptoms.   .. Active Ambulatory Problems    Diagnosis Date Noted  . Hyperlipidemia 07/09/2013  . GERD (gastroesophageal reflux disease) 07/09/2013  . DDD (degenerative disc disease), lumbosacral 07/09/2013  . Bipolar 1 disorder ([place]) 07/09/2013  . Osteoarthritis of left glenohumeral joint 04/30/2014  . Left shoulder pain 03/01/2015  . Bilateral carpal tunnel syndrome 05/03/2015  . DDD (degenerative disc disease), cervical 05/03/2015  . Lumbar disc herniation with radiculopathy 05/03/2015  . Chronic SI joint pain 09/21/2015  . Leukocytosis 01/14/2016  . Acquired hypothyroidism 01/14/2016  . Callus 05/08/2016  . Tobacco abuse 10/12/2016  . Colon polyp 01/04/2018  . BMI  35.0-35.9,adult 12/19/2018  . Myalgia 06/23/2019  . Chronic cough 07/23/2019  . Arthralgia 07/23/2019  . Glossitis 08/14/2019  . Primary osteoarthritis of right ankle 08/21/2019  . Bilateral lower extremity edema 10/22/2019  . Elevated blood pressure reading 10/24/2019  . Proteinuria 10/24/2019  . Chronic bilateral low back pain with bilateral sciatica 11/11/2019  . Weakness 12/22/2019  . Fatigue 12/22/2019  . Chronic venous stasis 01/19/2020  . RLS (restless legs syndrome) 04/16/2020  . Chronic pain syndrome 05/05/2020  . Right upper quadrant guarding 10/18/2020  . Hepatic steatosis 10/18/2020  . Abdominal distension (gaseous) 10/18/2020   Resolved Ambulatory Problems    Diagnosis Date Noted  . Low back pain with sciatica 07/09/2013  . Impacted cerumen of left ear 04/23/2017   Past Medical History:  Diagnosis Date  . Degenerative disc disease, lumbar      Review of Systems See HPI.     Objective:   Physical Exam Vitals reviewed.  Constitutional:      Appearance: Normal appearance. She is obese.  HENT:     Head: Normocephalic.  Cardiovascular:     Rate and Rhythm: Normal rate and regular rhythm.     Pulses: Normal pulses.  Pulmonary:     Effort: Pulmonary effort is normal.     Breath sounds: Normal breath sounds.  Abdominal:     General: There is distension.     Tenderness: There is abdominal tenderness. There is guarding. There is no right CVA tenderness, left CVA tenderness or rebound.     Comments:  Hepatomegaly.  RUQ and RLQ guarding.  Tenderness to palpation over the right flank.   Neurological:     General: No focal deficit present.     Mental Status: She is alert and oriented to person, place, and time.  Psychiatric:        Mood and Affect: Mood normal.           Assessment & Plan:  Marland KitchenMarland KitchenJohanna was seen today for hospitalization follow-up.  Diagnoses and all orders for this visit:  Right upper quadrant guarding -     CT Abdomen Pelvis W  Contrast  Leukocytosis, unspecified type -     CT Abdomen Pelvis W Contrast  Right lower quadrant guarding -     CT Abdomen Pelvis W Contrast  Abdominal distension (gaseous) -     CT Abdomen Pelvis W Contrast  Hepatic steatosis  Right flank pain   Unclear etiology of sudden right flank right upper quadrant right lower quadrant pain.  With elevated WBC and guarding concerning for acute abdomen.  Will get CT scan to evaluate gallbladder.  She does not have a pancreas.  She does have rather extensive distention of her abdomen.  Discussed fatty liver with patient.  Her liver enzymes are normal.  The best thing she can do is work on weight loss.  Continue to use Norco for pain management. Follow up with any new symptoms like blood in stool, fever, nausea and vomiting. If CT normal. Will focus on musculoskeletal pain.

## 2020-10-18 NOTE — Progress Notes (Signed)
Sent!

## 2020-10-26 ENCOUNTER — Telehealth: Payer: Self-pay | Admitting: Neurology

## 2020-10-26 NOTE — Telephone Encounter (Signed)
Patient left vm asking if you would consider a RX for prednisone. She is still having muscle pains over her ribs. Seen on 10/18/2020. Please advise.

## 2020-10-27 MED ORDER — PREDNISONE 50 MG PO TABS: ORAL_TABLET | ORAL | 0 refills | Status: DC

## 2020-10-27 NOTE — Telephone Encounter (Signed)
Sent burst of prednisone for chest wall pain to see if some costochondritis causing this pain.

## 2020-10-27 NOTE — Telephone Encounter (Signed)
Called patient and made her aware.

## 2020-10-31 ENCOUNTER — Other Ambulatory Visit: Payer: Self-pay | Admitting: Physician Assistant

## 2020-11-16 ENCOUNTER — Telehealth (HOSPITAL_COMMUNITY): Payer: Medicare Other | Admitting: Psychiatry

## 2020-11-29 ENCOUNTER — Ambulatory Visit (INDEPENDENT_AMBULATORY_CARE_PROVIDER_SITE_OTHER): Payer: Medicare Other | Admitting: Physician Assistant

## 2020-11-29 ENCOUNTER — Other Ambulatory Visit: Payer: Self-pay

## 2020-11-29 VITALS — BP 132/72 | HR 100 | Ht 65.0 in | Wt 247.0 lb

## 2020-11-29 DIAGNOSIS — E039 Hypothyroidism, unspecified: Secondary | ICD-10-CM

## 2020-11-29 DIAGNOSIS — M19012 Primary osteoarthritis, left shoulder: Secondary | ICD-10-CM

## 2020-11-29 DIAGNOSIS — M5137 Other intervertebral disc degeneration, lumbosacral region: Secondary | ICD-10-CM

## 2020-11-29 DIAGNOSIS — M5030 Other cervical disc degeneration, unspecified cervical region: Secondary | ICD-10-CM | POA: Diagnosis not present

## 2020-11-29 DIAGNOSIS — M5442 Lumbago with sciatica, left side: Secondary | ICD-10-CM

## 2020-11-29 DIAGNOSIS — M5441 Lumbago with sciatica, right side: Secondary | ICD-10-CM

## 2020-11-29 DIAGNOSIS — R600 Localized edema: Secondary | ICD-10-CM

## 2020-11-29 DIAGNOSIS — M5116 Intervertebral disc disorders with radiculopathy, lumbar region: Secondary | ICD-10-CM | POA: Diagnosis not present

## 2020-11-29 DIAGNOSIS — G8929 Other chronic pain: Secondary | ICD-10-CM

## 2020-11-29 DIAGNOSIS — Z79899 Other long term (current) drug therapy: Secondary | ICD-10-CM

## 2020-11-29 DIAGNOSIS — J301 Allergic rhinitis due to pollen: Secondary | ICD-10-CM

## 2020-11-29 DIAGNOSIS — G894 Chronic pain syndrome: Secondary | ICD-10-CM

## 2020-11-29 MED ORDER — FUROSEMIDE 20 MG PO TABS: 20.0000 mg | ORAL_TABLET | Freq: Every day | ORAL | 1 refills | Status: DC | PRN

## 2020-11-29 MED ORDER — HYDROCODONE-ACETAMINOPHEN 5-325 MG PO TABS
1.0000 | ORAL_TABLET | Freq: Two times a day (BID) | ORAL | 0 refills | Status: DC | PRN
Start: 2020-11-29 — End: 2021-03-03

## 2020-11-29 MED ORDER — HYDROCODONE-ACETAMINOPHEN 5-325 MG PO TABS: 1.0000 | ORAL_TABLET | Freq: Two times a day (BID) | ORAL | 0 refills | Status: DC | PRN

## 2020-11-29 MED ORDER — LEVOTHYROXINE SODIUM 50 MCG PO TABS: 50.0000 ug | ORAL_TABLET | Freq: Every day | ORAL | 2 refills | Status: DC

## 2020-11-29 MED ORDER — HYDROCODONE-ACETAMINOPHEN 5-325 MG PO TABS
1.0000 | ORAL_TABLET | Freq: Two times a day (BID) | ORAL | 0 refills | Status: DC | PRN
Start: 2020-12-28 — End: 2021-03-03

## 2020-11-29 MED ORDER — FLUTICASONE PROPIONATE 50 MCG/ACT NA SUSP: 2.0000 | Freq: Every day | NASAL | 2 refills | Status: DC

## 2020-11-29 NOTE — Progress Notes (Signed)
Subjective:    Patient ID: Amber Rice, female    DOB: 1956-07-02, 65 y.o.   MRN: [id number]  HPI  Patient is a 65 year old female hypothyroidism, GERD, seasonal allergies, chronic pain due to osteoarthritis and degenerative disc disease, bipolar who presents to the clinic for medication follow up.   Since being on work-up pain control has been so much better.  She is taking as needed up to twice a day usually. She continues to see ortho for left shoulder pain and stiffiness.    Pt is going off lithium this Thursday. Goal is to see if will help with liver enzymes and thyroid.   .. Active Ambulatory Problems    Diagnosis Date Noted  . Hyperlipidemia 07/09/2013  . GERD (gastroesophageal reflux disease) 07/09/2013  . DDD (degenerative disc disease), lumbosacral 07/09/2013  . Bipolar 1 disorder ([place]) 07/09/2013  . Osteoarthritis of left glenohumeral joint 04/30/2014  . Left shoulder pain 03/01/2015  . Bilateral carpal tunnel syndrome 05/03/2015  . DDD (degenerative disc disease), cervical 05/03/2015  . Lumbar disc herniation with radiculopathy 05/03/2015  . Chronic SI joint pain 09/21/2015  . Leukocytosis 01/14/2016  . Acquired hypothyroidism 01/14/2016  . Callus 05/08/2016  . Tobacco abuse 10/12/2016  . Colon polyp 01/04/2018  . BMI 35.0-35.9,adult 12/19/2018  . Myalgia 06/23/2019  . Chronic cough 07/23/2019  . Arthralgia 07/23/2019  . Glossitis 08/14/2019  . Primary osteoarthritis of right ankle 08/21/2019  . Bilateral lower extremity edema 10/22/2019  . Elevated blood pressure reading 10/24/2019  . Proteinuria 10/24/2019  . Chronic bilateral low back pain with bilateral sciatica 11/11/2019  . Weakness 12/22/2019  . Fatigue 12/22/2019  . Chronic venous stasis 01/19/2020  . RLS (restless legs syndrome) 04/16/2020  . Chronic pain syndrome 05/05/2020  . Right upper quadrant guarding 10/18/2020  . Hepatic steatosis 10/18/2020  . Abdominal distension (gaseous) 10/18/2020   . Seasonal allergic rhinitis due to pollen 11/29/2020   Resolved Ambulatory Problems    Diagnosis Date Noted  . Low back pain with sciatica 07/09/2013  . Impacted cerumen of left ear 04/23/2017   Past Medical History:  Diagnosis Date  . Degenerative disc disease, lumbar     Review of Systems See HPI.     Objective:   Physical Exam Vitals reviewed.  Constitutional:      Appearance: Normal appearance. She is obese.  Cardiovascular:     Rate and Rhythm: Normal rate and regular rhythm.     Pulses: Normal pulses.  Pulmonary:     Effort: Pulmonary effort is normal.     Breath sounds: Normal breath sounds.  Musculoskeletal:     Right lower leg: No edema.     Left lower leg: No edema.  Neurological:     General: No focal deficit present.     Mental Status: She is alert and oriented to person, place, and time.  Psychiatric:        Mood and Affect: Mood normal.           Assessment & Plan:  Marland KitchenMarland KitchenElica was seen today for pain.  Diagnoses and all orders for this visit:  DDD (degenerative disc disease), cervical -     HYDROcodone-acetaminophen (NORCO/VICODIN) 5-325 MG tablet; Take 1 tablet by mouth every 12 (twelve) hours as needed for moderate pain. -     HYDROcodone-acetaminophen (NORCO/VICODIN) 5-325 MG tablet; Take 1 tablet by mouth every 12 (twelve) hours as needed for moderate pain. -     HYDROcodone-acetaminophen (NORCO/VICODIN) 5-325 MG tablet; Take 1 tablet  by mouth every 12 (twelve) hours as needed for moderate pain.  DDD (degenerative disc disease), lumbosacral -     HYDROcodone-acetaminophen (NORCO/VICODIN) 5-325 MG tablet; Take 1 tablet by mouth every 12 (twelve) hours as needed for moderate pain. -     HYDROcodone-acetaminophen (NORCO/VICODIN) 5-325 MG tablet; Take 1 tablet by mouth every 12 (twelve) hours as needed for moderate pain. -     HYDROcodone-acetaminophen (NORCO/VICODIN) 5-325 MG tablet; Take 1 tablet by mouth every 12 (twelve) hours as needed for  moderate pain.  Chronic bilateral low back pain with bilateral sciatica -     HYDROcodone-acetaminophen (NORCO/VICODIN) 5-325 MG tablet; Take 1 tablet by mouth every 12 (twelve) hours as needed for moderate pain. -     HYDROcodone-acetaminophen (NORCO/VICODIN) 5-325 MG tablet; Take 1 tablet by mouth every 12 (twelve) hours as needed for moderate pain. -     HYDROcodone-acetaminophen (NORCO/VICODIN) 5-325 MG tablet; Take 1 tablet by mouth every 12 (twelve) hours as needed for moderate pain.  Lumbar disc herniation with radiculopathy -     HYDROcodone-acetaminophen (NORCO/VICODIN) 5-325 MG tablet; Take 1 tablet by mouth every 12 (twelve) hours as needed for moderate pain. -     HYDROcodone-acetaminophen (NORCO/VICODIN) 5-325 MG tablet; Take 1 tablet by mouth every 12 (twelve) hours as needed for moderate pain. -     HYDROcodone-acetaminophen (NORCO/VICODIN) 5-325 MG tablet; Take 1 tablet by mouth every 12 (twelve) hours as needed for moderate pain.  Osteoarthritis of left glenohumeral joint -     HYDROcodone-acetaminophen (NORCO/VICODIN) 5-325 MG tablet; Take 1 tablet by mouth every 12 (twelve) hours as needed for moderate pain. -     HYDROcodone-acetaminophen (NORCO/VICODIN) 5-325 MG tablet; Take 1 tablet by mouth every 12 (twelve) hours as needed for moderate pain. -     HYDROcodone-acetaminophen (NORCO/VICODIN) 5-325 MG tablet; Take 1 tablet by mouth every 12 (twelve) hours as needed for moderate pain.  Acquired hypothyroidism -     TSH -     levothyroxine (SYNTHROID) 50 MCG tablet; Take 1 tablet (50 mcg total) by mouth daily before breakfast.  Seasonal allergic rhinitis due to pollen -     fluticasone (FLONASE) 50 MCG/ACT nasal spray; Place 2 sprays into both nostrils daily.  Medication management -     COMPLETE METABOLIC PANEL WITH GFR  Bilateral lower extremity edema -     furosemide (LASIX) 20 MG tablet; Take 1 tablet (20 mg total) by mouth daily as needed for fluid.    Marland KitchenMarland KitchenPDMP  reviewed during this encounter. Pain contract UTD.  UDS UTD.  Refilled norco for 3 months.  Pt will have every 3 month appt due fullfill [place] pain agreement. No NSAIDS due to GERD.  No edema today. Lasix as needed. Refilled. Discussed low salt diet and compression stockings as well.   Needs some labs today. Will adjust meds accordingly. Wait until off lithium for 6 weeks.

## 2020-11-29 NOTE — Patient Instructions (Signed)
Check TSH and CMP 6-8 weeks after switching mood stabilizer.

## 2020-12-01 ENCOUNTER — Ambulatory Visit: Payer: Medicare Other | Admitting: Physician Assistant

## 2020-12-02 ENCOUNTER — Encounter (HOSPITAL_COMMUNITY): Payer: Self-pay | Admitting: Psychiatry

## 2020-12-02 ENCOUNTER — Telehealth (INDEPENDENT_AMBULATORY_CARE_PROVIDER_SITE_OTHER): Payer: Medicare Other | Admitting: Psychiatry

## 2020-12-02 DIAGNOSIS — F315 Bipolar disorder, current episode depressed, severe, with psychotic features: Secondary | ICD-10-CM

## 2020-12-02 DIAGNOSIS — G8929 Other chronic pain: Secondary | ICD-10-CM

## 2020-12-02 NOTE — Progress Notes (Signed)
Patient ID: Amber Rice, female   DOB: 1956-01-18, 65 y.o.   MRN: [id number] [place] [id number] 65 y.o.  12/02/2020  Chief Complaint: Follow up bipolar disorder. Med review    Virtual Visit via Telephone Note  I connected with Amber Rice on 12/02/20 at  1:00 PM EDT by telephone and verified that I am speaking with the correct person using two identifiers.  Location: Patient: home Provider: office   I discussed the limitations, risks, security and privacy concerns of performing an evaluation and management service by telephone and the availability of in person appointments. I also discussed with the patient that there may be a patient responsible charge related to this service. The patient expressed understanding and agreed to proceed.    I discussed the assessment and treatment plan with the patient. The patient was provided an opportunity to ask questions and all were answered. The patient agreed with the plan and demonstrated an understanding of the instructions.   The patient was advised to call back or seek an in-person evaluation if the symptoms worsen or if the condition fails to improve as anticipated.  I provided 12  minutes of non-face-to-face time during this encounter including documentation.    History of Present Illness:   Diagnosed with bipolar disorder depressed. Anxiety disorder NOS. Insomnia  Has back pain and follows up with Dr. [name] or [name].   Patient is still recovering from shoulder surgery and has having pain she plans to have another opinion. Pain can affect mood but otherwise she feels that the lithium and sertraline are keeping some balance and does not want to change or adjust medication lithium levels were reviewed she does have the support of her sister     no paranoia  Duration of bipolar :12 plus years  Aggravating factor : Shoulder surgery depression scale 7/10 . 10 being no  depression  Modifying factors; walking when she can . Reading books   Past Medical History:  Diagnosis Date  . Degenerative disc disease, lumbar    diagnosed in 2005  . Hyperlipidemia   . Impacted cerumen of left ear 04/23/2017   Family History  Problem Relation Age of Onset  . Cancer Mother   . Heart attack Father   . Hyperlipidemia Father   . Cancer Maternal Grandmother   . Cancer Paternal Grandmother   . Bipolar disorder Paternal Uncle        Committed suicide in his 70's  . Dementia Paternal Uncle   . Depression Cousin   . Schizophrenia Cousin   . Multiple sclerosis Sister   . Breast cancer Cousin   . Alcohol abuse Neg Hx   . Anxiety disorder Neg Hx     Outpatient Encounter Medications as of 12/02/2020  Medication Sig  . fluticasone (FLONASE) 50 MCG/ACT nasal spray Place 2 sprays into both nostrils daily.  . furosemide (LASIX) 20 MG tablet Take 1 tablet (20 mg total) by mouth daily as needed for fluid.  Marland Kitchen HYDROcodone-acetaminophen (NORCO/VICODIN) 5-325 MG tablet Take 1 tablet by mouth every 12 (twelve) hours as needed for moderate pain.  Derrill Memo ON 12/28/2020] HYDROcodone-acetaminophen (NORCO/VICODIN) 5-325 MG tablet Take 1 tablet by mouth every 12 (twelve) hours as needed for moderate pain.  Derrill Memo ON 01/27/2021] HYDROcodone-acetaminophen (NORCO/VICODIN) 5-325 MG tablet Take 1 tablet by mouth every 12 (twelve) hours as needed for moderate pain.  Marland Kitchen levothyroxine (SYNTHROID) 50 MCG tablet Take 1 tablet (50 mcg total) by mouth daily  before breakfast.  . lithium carbonate (LITHOBID) 300 MG CR tablet Take 1 tablet (300 mg total) by mouth 2 (two) times daily. (Patient taking differently: Take 600 mg by mouth daily.)  . rOPINIRole (REQUIP) 0.25 MG tablet TAKE 1-4 TABLETS BY MOUTH 1 HOUR BEFORE BED FOR RESTLESS LEG. (Patient taking differently: Take 0.25-4 mg by mouth at bedtime as needed (restless leg).)  . sertraline (ZOLOFT) 100 MG tablet Take 1 tablet (100 mg total) by mouth 2  (two) times daily. (Patient taking differently: Take 200 mg by mouth daily.)   No facility-administered encounter medications on file as of 12/02/2020.    Recent Results (from the past 2160 hour(s))  TSH     Status: None   Collection Time: 09/08/20  1:54 PM  Result Value Ref Range   TSH 4.11 0.40 - 4.50 mIU/L  CBC with Differential/Platelet     Status: Abnormal   Collection Time: 10/14/20 12:26 PM  Result Value Ref Range   WBC 13.7 (H) 4.0 - 10.5 K/uL   RBC 4.22 3.87 - 5.11 MIL/uL   Hemoglobin 13.5 12.0 - 15.0 g/dL   HCT 42.6 36.0 - 46.0 %   MCV 100.9 (H) 80.0 - 100.0 fL   MCH 32.0 26.0 - 34.0 pg   MCHC 31.7 30.0 - 36.0 g/dL   RDW 13.2 11.5 - 15.5 %   Platelets 404 (H) 150 - 400 K/uL   nRBC 0.0 0.0 - 0.2 %   Neutrophils Relative % 76 %   Neutro Abs 10.5 (H) 1.7 - 7.7 K/uL   Lymphocytes Relative 13 %   Lymphs Abs 1.8 0.7 - 4.0 K/uL   Monocytes Relative 6 %   Monocytes Absolute 0.8 0.1 - 1.0 K/uL   Eosinophils Relative 3 %   Eosinophils Absolute 0.4 0.0 - 0.5 K/uL   Basophils Relative 1 %   Basophils Absolute 0.1 0.0 - 0.1 K/uL   Immature Granulocytes 1 %   Abs Immature Granulocytes 0.16 (H) 0.00 - 0.07 K/uL    Comment: Performed at [facility], [address], [place], [place] [address]  Comprehensive metabolic panel     Status: Abnormal   Collection Time: 10/14/20 12:26 PM  Result Value Ref Range   Sodium 136 135 - 145 mmol/L   Potassium 4.2 3.5 - 5.1 mmol/L   Chloride 105 98 - 111 mmol/L   CO2 23 22 - 32 mmol/L   Glucose, Bld 106 (H) 70 - 99 mg/dL    Comment: Glucose reference range applies only to samples taken after fasting for at least 8 hours.   BUN 16 8 - 23 mg/dL   Creatinine, Ser 0.91 0.44 - 1.00 mg/dL   Calcium 8.9 8.9 - 10.3 mg/dL   Total Protein 6.9 6.5 - 8.1 g/dL   Albumin 3.4 (L) 3.5 - 5.0 g/dL   AST 19 15 - 41 U/L   ALT 25 0 - 44 U/L   Alkaline Phosphatase 93 38 - 126 U/L   Total Bilirubin 0.3 0.3 - 1.2 mg/dL   GFR, Estimated >60 >60 mL/min     Comment: (NOTE) Calculated using the CKD-EPI Creatinine Equation (2021)    Anion gap 8 5 - 15    Comment: Performed at [facility], [address], [place], [place] [address]  Lipase, blood     Status: None   Collection Time: 10/14/20 12:26 PM  Result Value Ref Range   Lipase 29 11 - 51 U/L    Comment: Performed at [facility],  [address] [place], [place] [address]    There were no vitals taken for this visit.   Review of Systems  Cardiovascular: Negative for chest pain.  Musculoskeletal: Positive for joint pain.  Psychiatric/Behavioral: Negative for substance abuse and suicidal ideas.    Mental Status Examination  Appearance:  Alert: Yes Attention: fair  Cooperative: Yes Eye Contact:  Speech: normal Psychomotor Activity: Decreased Memory/Concentration: adequate Oriented: person, place, time/date and situation Mood: Somewhat subdued Affect: congruent Thought Processes and Associations: Coherent no paranoia as of now [employer] of Knowledge: Fair Thought Content: Suicidal ideation and Homicidal ideation were denied Insight: Fair Judgement: Fair  Diagnosis: Bipolar disorder type I manic per history for psychotic features. Insomnia NOS. Adjustment disorder with anxiety . EPS Treatment Plan:   1. Bipolar: depression pain can affect mood but overall she feels medication does keep some balance provided supportive therapy continue lithium  2. Depression: see above, continue zoloft 3. Insomnia:irregular sleep hygiene reviewed.  Follow-up in 4 months call for refills labs reviewed [name], MD

## 2020-12-17 ENCOUNTER — Telehealth: Payer: Self-pay | Admitting: Neurology

## 2020-12-17 DIAGNOSIS — M5116 Intervertebral disc disorders with radiculopathy, lumbar region: Secondary | ICD-10-CM

## 2020-12-17 NOTE — Telephone Encounter (Signed)
Tell pt she is really supposed to have an appt for prednisone to be prescribed but his Friday so ok to send prednisone 50mg  one tablet daily for 5 days for lumbar raduiculopathy. NRF

## 2020-12-17 NOTE — Telephone Encounter (Signed)
Patient left vm stating she is having so much back pain it is hard to walk. No injury. She is taking pain medication, but feels like she needs a boost of prednisone to help. Last prescribed prednisone in March. Do you need to see her to evaluate?

## 2020-12-20 MED ORDER — PREDNISONE 50 MG PO TABS: 50.0000 mg | ORAL_TABLET | Freq: Every day | ORAL | 0 refills | Status: DC

## 2020-12-20 NOTE — Telephone Encounter (Signed)
Patient made aware and RX sent to pharmacy.

## 2020-12-26 ENCOUNTER — Other Ambulatory Visit: Payer: Self-pay | Admitting: Physician Assistant

## 2020-12-26 DIAGNOSIS — R600 Localized edema: Secondary | ICD-10-CM

## 2021-01-02 IMAGING — DX DG FOOT COMPLETE 3+V*R*
3 series · 3 of 3 positions shown · non-contrast
Comparison: None.

CLINICAL DATA: Distal right foot pain for 2 years. No known injury.

EXAM:
RIGHT FOOT COMPLETE - 3+ VIEW

[foot ap]
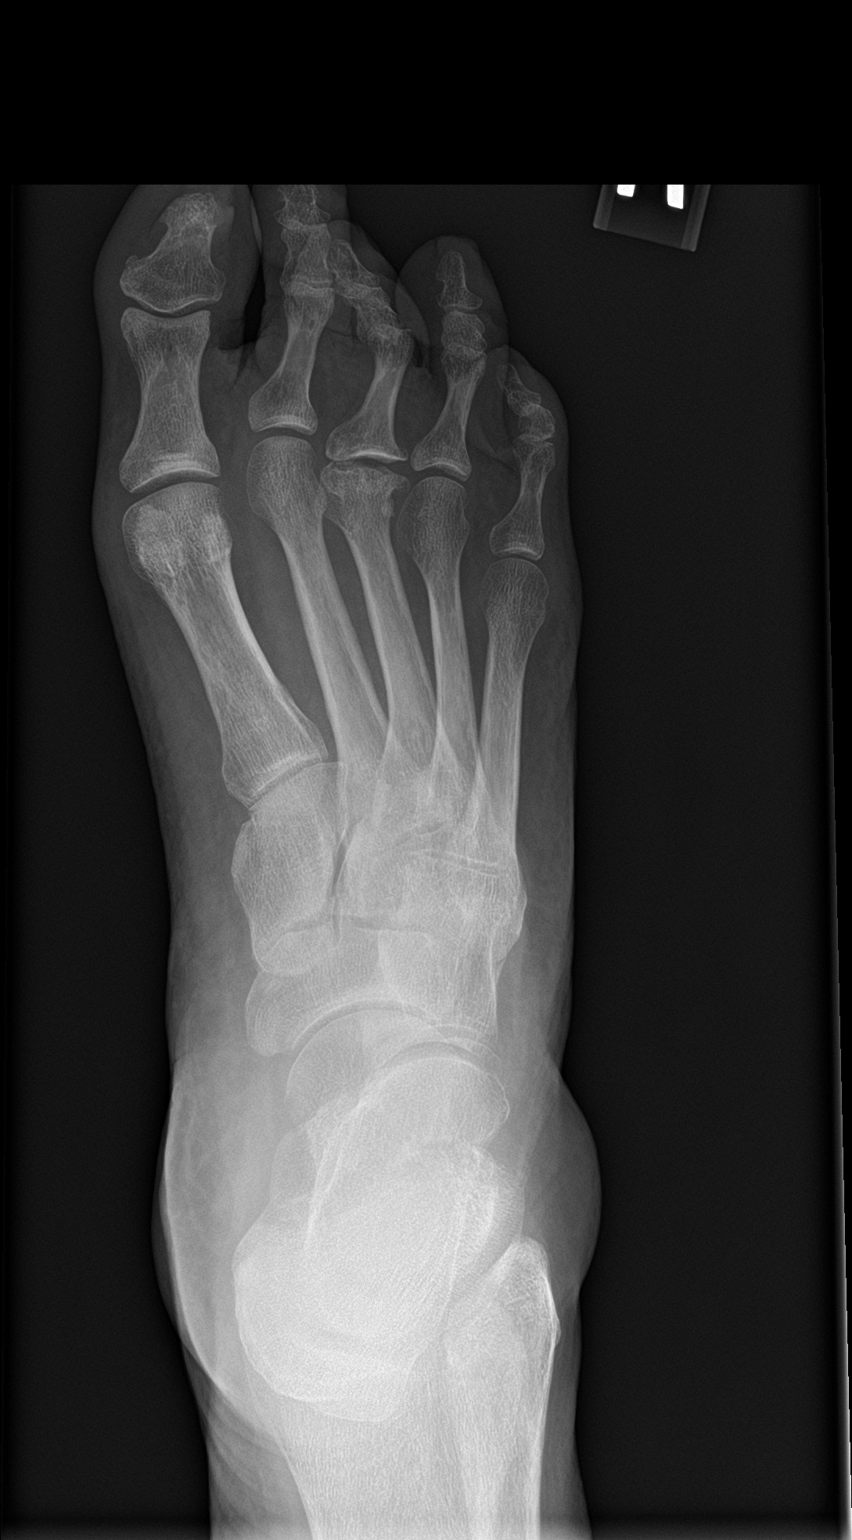

[foot obl]
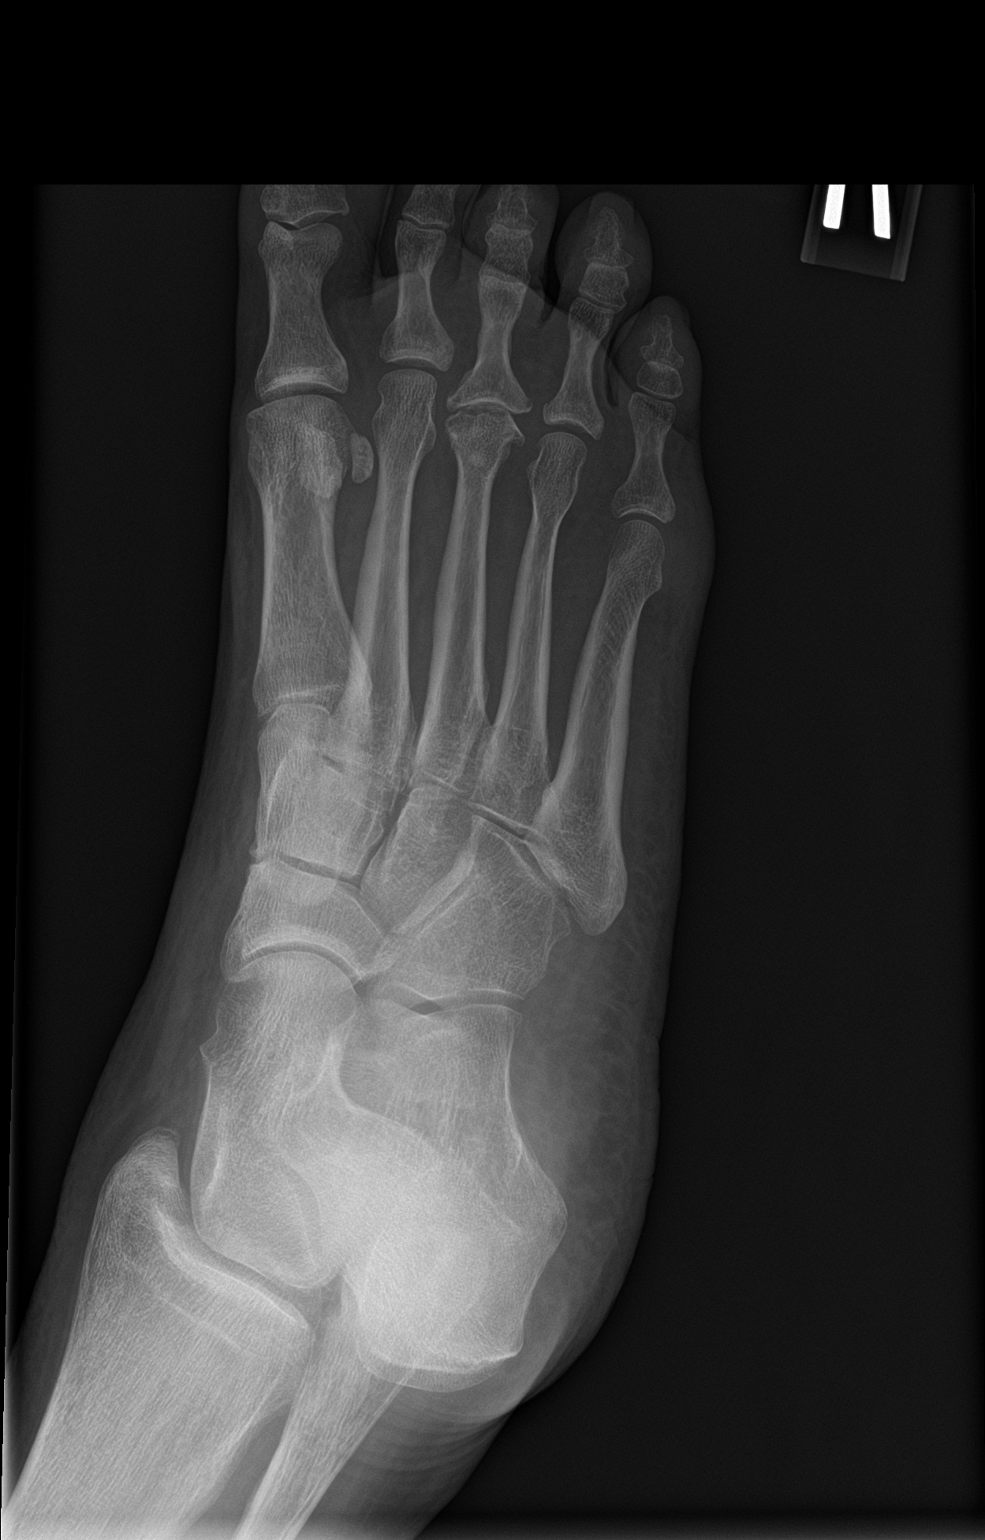

[foot lat]
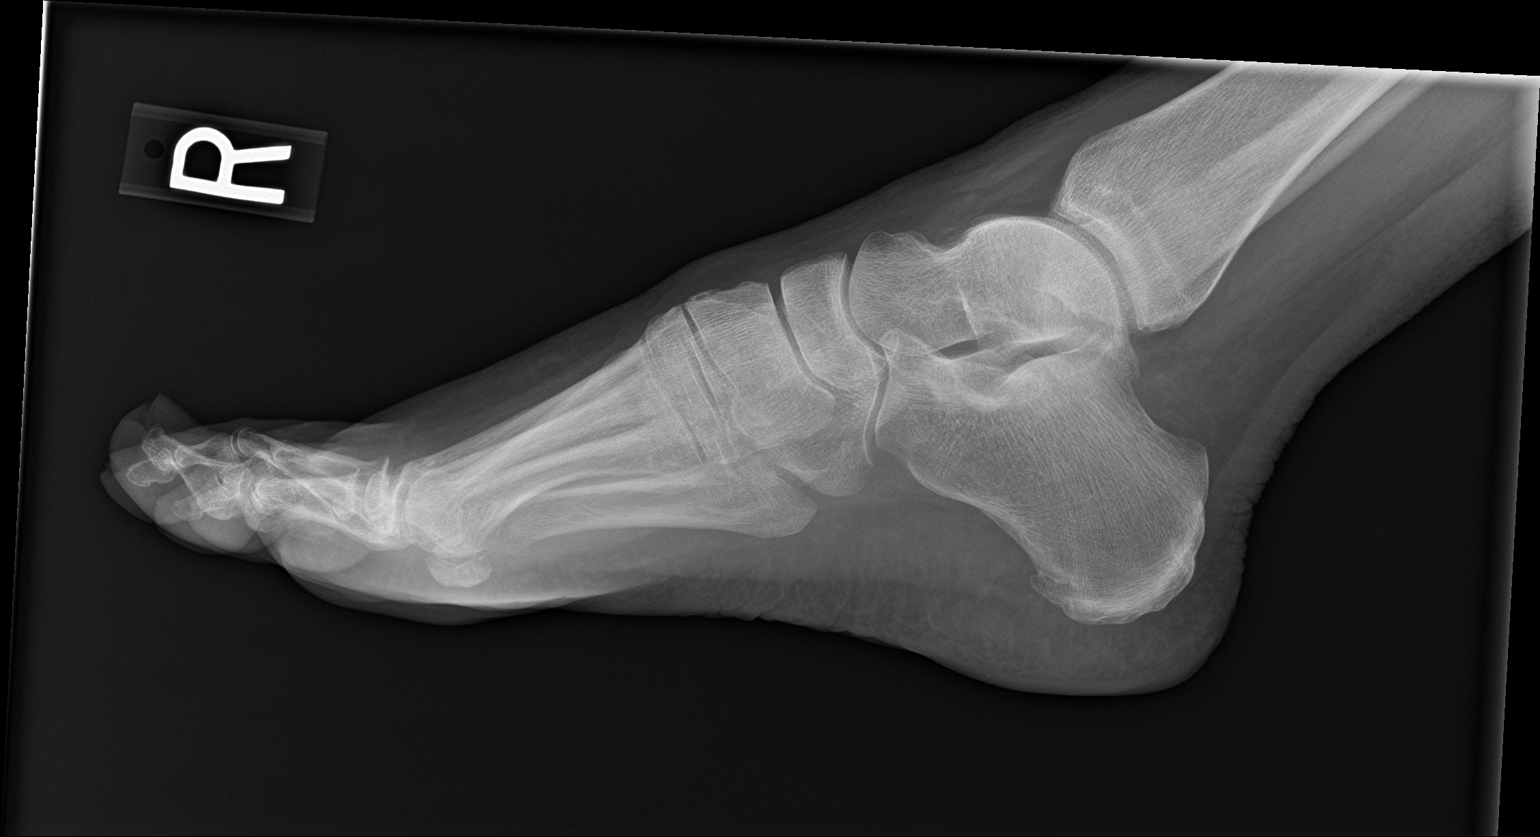

[3 of 3 positions shown; findings below may reference images not displayed]

FINDINGS: There is no acute bony or joint abnormality. The head of the third
metatarsal is flattened. There is advanced third MTP joint
osteoarthritis with joint space narrowing and osteophytosis present.
Joint spaces are otherwise preserved. No acute bony or joint
abnormality. Soft tissues appear normal.
IMPRESSION: No acute abnormality.

Advanced third MTP joint osteoarthritis. Flattening of the third
metatarsal head could be due to remote stress fracture or avascular
necrosis.

## 2021-01-04 ENCOUNTER — Telehealth (HOSPITAL_COMMUNITY): Payer: Self-pay

## 2021-01-04 DIAGNOSIS — F315 Bipolar disorder, current episode depressed, severe, with psychotic features: Secondary | ICD-10-CM

## 2021-01-04 MED ORDER — LITHIUM CARBONATE ER 300 MG PO TBCR: 300.0000 mg | EXTENDED_RELEASE_TABLET | Freq: Two times a day (BID) | ORAL | 0 refills | Status: DC

## 2021-01-04 MED ORDER — SERTRALINE HCL 100 MG PO TABS: 100.0000 mg | ORAL_TABLET | Freq: Two times a day (BID) | ORAL | 0 refills | Status: DC

## 2021-01-04 NOTE — Telephone Encounter (Signed)
New 90 day orders for patient's prescribed Zoloft and Lithobid e-scribed as verbally approved by Dr. [name] this date to patient's [facility] in [place], [place].

## 2021-01-04 NOTE — Telephone Encounter (Signed)
Medication refill request for patient's Lithium 300 mg, one twice a day, #180 also received as order had no refills and patient does not have next appointment until 03/09/21.

## 2021-01-04 NOTE — Telephone Encounter (Signed)
Medication refill request - Fax received from pt's [facility] for a refill of her Sertraline 100 mg tablets, one twice a day, #180. Last filled on 10/05/20 with no refills and pt's next appt not until 03/09/21.

## 2021-01-04 NOTE — Telephone Encounter (Signed)
You can send refills if due till next appointment month

## 2021-01-20 ENCOUNTER — Other Ambulatory Visit: Payer: Self-pay | Admitting: Physician Assistant

## 2021-01-21 NOTE — Telephone Encounter (Signed)
No longer on med list, please review.

## 2021-01-27 ENCOUNTER — Other Ambulatory Visit: Payer: Self-pay | Admitting: Physician Assistant

## 2021-01-27 DIAGNOSIS — E039 Hypothyroidism, unspecified: Secondary | ICD-10-CM

## 2021-02-02 ENCOUNTER — Other Ambulatory Visit: Payer: Self-pay | Admitting: Physician Assistant

## 2021-02-02 DIAGNOSIS — R600 Localized edema: Secondary | ICD-10-CM

## 2021-02-22 ENCOUNTER — Telehealth: Payer: Self-pay | Admitting: Neurology

## 2021-02-22 NOTE — Telephone Encounter (Signed)
Patient left vm letting us know she has transferred pharmacies to [facility] in [place]. This has been changed in chart.

## 2021-03-05 ENCOUNTER — Other Ambulatory Visit: Payer: Self-pay | Admitting: Physician Assistant

## 2021-03-05 DIAGNOSIS — E039 Hypothyroidism, unspecified: Secondary | ICD-10-CM

## 2021-03-07 ENCOUNTER — Other Ambulatory Visit: Payer: Self-pay

## 2021-03-07 ENCOUNTER — Ambulatory Visit (INDEPENDENT_AMBULATORY_CARE_PROVIDER_SITE_OTHER): Payer: Medicare Other | Admitting: Physician Assistant

## 2021-03-07 ENCOUNTER — Encounter: Payer: Self-pay | Admitting: Physician Assistant

## 2021-03-07 VITALS — BP 132/45 | HR 97 | Temp 98.8°F | Ht 65.0 in | Wt 247.0 lb

## 2021-03-07 DIAGNOSIS — M5116 Intervertebral disc disorders with radiculopathy, lumbar region: Secondary | ICD-10-CM

## 2021-03-07 DIAGNOSIS — M5442 Lumbago with sciatica, left side: Secondary | ICD-10-CM

## 2021-03-07 DIAGNOSIS — M720 Palmar fascial fibromatosis [Dupuytren]: Secondary | ICD-10-CM | POA: Diagnosis not present

## 2021-03-07 DIAGNOSIS — R600 Localized edema: Secondary | ICD-10-CM | POA: Insufficient documentation

## 2021-03-07 DIAGNOSIS — M5441 Lumbago with sciatica, right side: Secondary | ICD-10-CM

## 2021-03-07 DIAGNOSIS — M5137 Other intervertebral disc degeneration, lumbosacral region: Secondary | ICD-10-CM

## 2021-03-07 DIAGNOSIS — M5030 Other cervical disc degeneration, unspecified cervical region: Secondary | ICD-10-CM

## 2021-03-07 DIAGNOSIS — M19012 Primary osteoarthritis, left shoulder: Secondary | ICD-10-CM

## 2021-03-07 DIAGNOSIS — M797 Fibromyalgia: Secondary | ICD-10-CM | POA: Insufficient documentation

## 2021-03-07 DIAGNOSIS — G8929 Other chronic pain: Secondary | ICD-10-CM

## 2021-03-07 MED ORDER — HYDROCODONE-ACETAMINOPHEN 5-325 MG PO TABS: 1.0000 | ORAL_TABLET | Freq: Two times a day (BID) | ORAL | 0 refills | Status: DC | PRN

## 2021-03-07 MED ORDER — PREGABALIN 75 MG PO CAPS: 75.0000 mg | ORAL_CAPSULE | Freq: Two times a day (BID) | ORAL | 2 refills | Status: DC

## 2021-03-07 NOTE — Patient Instructions (Signed)

## 2021-03-07 NOTE — Progress Notes (Signed)
Subjective:    Patient ID: Amber Rice, female    DOB: 1956-07-12, 65 y.o.   MRN: [id number]  HPI Pt is a 65 yo obese female with chronic pain due to osteoarthritis and DDD who presents to the clinic with multiple concerns and medication refills.   Her left middle finger is being pulled into her palm since July. It is painful and hurts to force her hand out. She has not tried anything to make better. Her right hand did the same thing but then suddenly resolved.   Pt needs her norco for overall pain control. Doing ok.   64 left and 60 cm-heaviness swelling. Venous blockat last   Pt continues to have chronic left shoulder pain after arthroplasty in 05/2020. It is getting better but surgeon suggested she discuss possible fibromyalgia dx with PCP. She does have good and bad days. Bad days pain is all over joint and muscles.   She continues to notice swelling of her right leg and feeling heavy. No pain. She is not very active but legs do not hurt when she is active.   .. Active Ambulatory Problems    Diagnosis Date Noted   Hyperlipidemia 07/09/2013   GERD (gastroesophageal reflux disease) 07/09/2013   DDD (degenerative disc disease), lumbosacral 07/09/2013   Bipolar 1 disorder ([place]) 07/09/2013   Osteoarthritis of left glenohumeral joint 04/30/2014   Left shoulder pain 03/01/2015   Bilateral carpal tunnel syndrome 05/03/2015   DDD (degenerative disc disease), cervical 05/03/2015   Lumbar disc herniation with radiculopathy 05/03/2015   Chronic SI joint pain 09/21/2015   Leukocytosis 01/14/2016   Acquired hypothyroidism 01/14/2016   Callus 05/08/2016   Tobacco abuse 10/12/2016   Colon polyp 01/04/2018   BMI 35.0-35.9,adult 12/19/2018   Myalgia 06/23/2019   Chronic cough 07/23/2019   Arthralgia 07/23/2019   Glossitis 08/14/2019   Primary osteoarthritis of right ankle 08/21/2019   Bilateral lower extremity edema 10/22/2019   Elevated blood pressure reading 10/24/2019    Proteinuria 10/24/2019   Chronic bilateral low back pain with bilateral sciatica 11/11/2019   Weakness 12/22/2019   Fatigue 12/22/2019   Chronic venous stasis 01/19/2020   RLS (restless legs syndrome) 04/16/2020   Chronic pain syndrome 05/05/2020   Right upper quadrant guarding 10/18/2020   Hepatic steatosis 10/18/2020   Abdominal distension (gaseous) 10/18/2020   Seasonal allergic rhinitis due to pollen 11/29/2020   Fibromyalgia 03/07/2021   Dupuytren's contracture of left hand 03/07/2021   Leg edema, right 03/07/2021   Resolved Ambulatory Problems    Diagnosis Date Noted   Low back pain with sciatica 07/09/2013   Impacted cerumen of left ear 04/23/2017   Past Medical History:  Diagnosis Date   Degenerative disc disease, lumbar      Review of Systems  All other systems reviewed and are negative.     Objective:   Physical Exam Vitals reviewed.  Constitutional:      Appearance: Normal appearance. She is obese.  HENT:     Head: Normocephalic.  Cardiovascular:     Rate and Rhythm: Normal rate and regular rhythm.     Pulses: Normal pulses.  Pulmonary:     Effort: Pulmonary effort is normal.  Musculoskeletal:     Comments: Left middle finger drawn in with fibrous tendon present in the palm and tender to palpation.   64cm right thigh 60cm left thigh  Scant edema of right ankle.   Neurological:     General: No focal deficit present.  Mental Status: She is alert and oriented to person, place, and time.          Assessment & Plan:  Marland KitchenMarland KitchenPacey was seen today for chronic pain managment, finger problem and leg problem.  Diagnoses and all orders for this visit:  Dupuytren's contracture of left hand  DDD (degenerative disc disease), lumbosacral -     HYDROcodone-acetaminophen (NORCO/VICODIN) 5-325 MG tablet; Take 1 tablet by mouth every 12 (twelve) hours as needed for moderate pain. -     HYDROcodone-acetaminophen (NORCO/VICODIN) 5-325 MG tablet; Take 1 tablet  by mouth every 12 (twelve) hours as needed for moderate pain. -     HYDROcodone-acetaminophen (NORCO/VICODIN) 5-325 MG tablet; Take 1 tablet by mouth every 12 (twelve) hours as needed for moderate pain. -     pregabalin (LYRICA) 75 MG capsule; Take 1 capsule (75 mg total) by mouth 2 (two) times daily.  Chronic bilateral low back pain with bilateral sciatica -     HYDROcodone-acetaminophen (NORCO/VICODIN) 5-325 MG tablet; Take 1 tablet by mouth every 12 (twelve) hours as needed for moderate pain. -     HYDROcodone-acetaminophen (NORCO/VICODIN) 5-325 MG tablet; Take 1 tablet by mouth every 12 (twelve) hours as needed for moderate pain. -     HYDROcodone-acetaminophen (NORCO/VICODIN) 5-325 MG tablet; Take 1 tablet by mouth every 12 (twelve) hours as needed for moderate pain. -     pregabalin (LYRICA) 75 MG capsule; Take 1 capsule (75 mg total) by mouth 2 (two) times daily.  Lumbar disc herniation with radiculopathy -     HYDROcodone-acetaminophen (NORCO/VICODIN) 5-325 MG tablet; Take 1 tablet by mouth every 12 (twelve) hours as needed for moderate pain. -     HYDROcodone-acetaminophen (NORCO/VICODIN) 5-325 MG tablet; Take 1 tablet by mouth every 12 (twelve) hours as needed for moderate pain. -     HYDROcodone-acetaminophen (NORCO/VICODIN) 5-325 MG tablet; Take 1 tablet by mouth every 12 (twelve) hours as needed for moderate pain. -     pregabalin (LYRICA) 75 MG capsule; Take 1 capsule (75 mg total) by mouth 2 (two) times daily.  DDD (degenerative disc disease), cervical -     HYDROcodone-acetaminophen (NORCO/VICODIN) 5-325 MG tablet; Take 1 tablet by mouth every 12 (twelve) hours as needed for moderate pain. -     HYDROcodone-acetaminophen (NORCO/VICODIN) 5-325 MG tablet; Take 1 tablet by mouth every 12 (twelve) hours as needed for moderate pain. -     HYDROcodone-acetaminophen (NORCO/VICODIN) 5-325 MG tablet; Take 1 tablet by mouth every 12 (twelve) hours as needed for moderate pain. -      pregabalin (LYRICA) 75 MG capsule; Take 1 capsule (75 mg total) by mouth 2 (two) times daily.  Osteoarthritis of left glenohumeral joint -     HYDROcodone-acetaminophen (NORCO/VICODIN) 5-325 MG tablet; Take 1 tablet by mouth every 12 (twelve) hours as needed for moderate pain. -     HYDROcodone-acetaminophen (NORCO/VICODIN) 5-325 MG tablet; Take 1 tablet by mouth every 12 (twelve) hours as needed for moderate pain. -     HYDROcodone-acetaminophen (NORCO/VICODIN) 5-325 MG tablet; Take 1 tablet by mouth every 12 (twelve) hours as needed for moderate pain. -     pregabalin (LYRICA) 75 MG capsule; Take 1 capsule (75 mg total) by mouth 2 (two) times daily.  Fibromyalgia -     pregabalin (LYRICA) 75 MG capsule; Take 1 capsule (75 mg total) by mouth 2 (two) times daily.  Leg edema, right  PDMP reviewed during this encounter. Pain contract UTD. Refilled for 3 months.  [place]  controlled substance requires visit every 3 months.   Suspect some myofascial pain variant. Start Lyrica. Discussed side effects. Follow up in 3 months.   Suspect dupuytren contracture. Referred to Dr. Darene Lamer for evaluation and treatment.   Right leg is more swollen than left leg. Referral placed for vascular evaluation.     Colonoscopy will call for report. Per patient done.

## 2021-03-09 ENCOUNTER — Telehealth (HOSPITAL_COMMUNITY): Payer: Medicare Other | Admitting: Psychiatry

## 2021-03-14 ENCOUNTER — Other Ambulatory Visit: Payer: Self-pay | Admitting: Physician Assistant

## 2021-03-14 DIAGNOSIS — M5116 Intervertebral disc disorders with radiculopathy, lumbar region: Secondary | ICD-10-CM

## 2021-03-14 DIAGNOSIS — M19012 Primary osteoarthritis, left shoulder: Secondary | ICD-10-CM

## 2021-03-14 DIAGNOSIS — M5137 Other intervertebral disc degeneration, lumbosacral region: Secondary | ICD-10-CM

## 2021-03-14 DIAGNOSIS — M5030 Other cervical disc degeneration, unspecified cervical region: Secondary | ICD-10-CM

## 2021-03-14 DIAGNOSIS — G8929 Other chronic pain: Secondary | ICD-10-CM

## 2021-03-14 MED ORDER — HYDROCODONE-ACETAMINOPHEN 5-325 MG PO TABS: 1.0000 | ORAL_TABLET | Freq: Two times a day (BID) | ORAL | 0 refills | Status: DC | PRN

## 2021-03-14 NOTE — Progress Notes (Signed)
Pharmacy requested rx for 46 tablets to combine with the 7 day initial refill.

## 2021-03-17 ENCOUNTER — Ambulatory Visit (INDEPENDENT_AMBULATORY_CARE_PROVIDER_SITE_OTHER): Payer: Medicare Other

## 2021-03-17 ENCOUNTER — Ambulatory Visit (INDEPENDENT_AMBULATORY_CARE_PROVIDER_SITE_OTHER): Payer: Medicare Other | Admitting: Sports Medicine

## 2021-03-17 DIAGNOSIS — M65332 Trigger finger, left middle finger: Secondary | ICD-10-CM | POA: Diagnosis not present

## 2021-03-17 NOTE — Progress Notes (Signed)
    Procedures performed today:    Procedure: Real-time Ultrasound Guided injection of the left third flexor tendon sheath Device: Samsung HS60  Verbal informed consent obtained.  Time-out conducted.  Noted no overlying erythema, induration, or other signs of local infection.  Skin prepped in a sterile fashion.  Local anesthesia: Topical Ethyl chloride.  With sterile technique and under real time ultrasound guidance: Noted flexor tendon nodule, 1/2 cc lidocaine, 1/2 cc kenalog 40 injected easily.   Completed without difficulty  Advised to call if fevers/chills, erythema, induration, drainage, or persistent bleeding.  Images permanently stored and available for review in PACS.  Impression: Technically successful ultrasound guided injection.  Independent interpretation of notes and tests performed by another provider:   None.  Brief History, Exam, Impression, and Recommendations:    Trigger finger, left middle finger [name] is triggering in her left middle finger, painful. She also has Dupuytren's contracture that is asymptomatic if she can place her hand fully flat on surface. Today we did a left third flexor tendon sheath injection, home rehab given, return to see me in a month.    ___________________________________________ Gwen Her. Dianah Field, M.D., ABFM., CAQSM. Primary Care and [place] Instructor of [place] [employer] [facility] [employer]

## 2021-03-17 NOTE — Assessment & Plan Note (Signed)
Amber Rice is triggering in her left middle finger, painful. She also has Dupuytren's contracture that is asymptomatic if she can place her hand fully flat on surface. Today we did a left third flexor tendon sheath injection, home rehab given, return to see me in a month.

## 2021-03-24 ENCOUNTER — Telehealth (INDEPENDENT_AMBULATORY_CARE_PROVIDER_SITE_OTHER): Payer: Medicare Other | Admitting: Psychiatry

## 2021-03-24 ENCOUNTER — Encounter (HOSPITAL_COMMUNITY): Payer: Self-pay | Admitting: Psychiatry

## 2021-03-24 DIAGNOSIS — F315 Bipolar disorder, current episode depressed, severe, with psychotic features: Secondary | ICD-10-CM

## 2021-03-24 DIAGNOSIS — G8929 Other chronic pain: Secondary | ICD-10-CM | POA: Diagnosis not present

## 2021-03-24 MED ORDER — LITHIUM CARBONATE ER 300 MG PO TBCR: 300.0000 mg | EXTENDED_RELEASE_TABLET | Freq: Two times a day (BID) | ORAL | 0 refills | Status: DC

## 2021-03-24 MED ORDER — SERTRALINE HCL 100 MG PO TABS: 100.0000 mg | ORAL_TABLET | Freq: Two times a day (BID) | ORAL | 0 refills | Status: DC

## 2021-03-24 NOTE — Progress Notes (Signed)
Patient ID: Amber Rice, female   DOB: 1956-01-22, 65 y.o.   MRN: [id number] [place] [id number] 65 y.o.  03/24/2021  Chief Complaint: Follow up bipolar disorder. Med review   Virtual Visit via Telephone Note  I connected with Amber Rice on 03/24/21 at  1:00 PM EDT by telephone and verified that I am speaking with the correct person using two identifiers.  Location: Patient: home Provider: office   I discussed the limitations, risks, security and privacy concerns of performing an evaluation and management service by telephone and the availability of in person appointments. I also discussed with the patient that there may be a patient responsible charge related to this service. The patient expressed understanding and agreed to proceed.     I discussed the assessment and treatment plan with the patient. The patient was provided an opportunity to ask questions and all were answered. The patient agreed with the plan and demonstrated an understanding of the instructions.   The patient was advised to call back or seek an in-person evaluation if the symptoms worsen or if the condition fails to improve as anticipated.  I provided 15  minutes of non-face-to-face time during this encounter.   Amber Capron, MD    History of Present Illness:   Diagnosed with bipolar disorder depressed. Anxiety disorder NOS. Insomnia  Has back pain and follows up with Dr. [name] or [name].  Doing fair mood wise, lihtium level requested . She talked about her  pain and treatment Feels comfortable with zoloft   No side effects  no paranoia  Duration of bipolar :12 plus years  Aggravating factor : Shoulder surgery depression scale 7/10 . 10 being no depression  Modifying factors; walking when she can .reading books   Past Medical History:  Diagnosis Date   Degenerative disc disease, lumbar    diagnosed in 2005   Hyperlipidemia     Impacted cerumen of left ear 04/23/2017   Family History  Problem Relation Age of Onset   Cancer Mother    Heart attack Father    Hyperlipidemia Father    Cancer Maternal Grandmother    Cancer Paternal Grandmother    Bipolar disorder Paternal Uncle        Committed suicide in his 91's   Dementia Paternal Uncle    Depression Cousin    Schizophrenia Cousin    Multiple sclerosis Sister    Breast cancer Cousin    Alcohol abuse Neg Hx    Anxiety disorder Neg Hx     Outpatient Encounter Medications as of 03/24/2021  Medication Sig   furosemide (LASIX) 20 MG tablet TAKE 1 TABLET (20 MG TOTAL) BY MOUTH DAILY AS NEEDED FOR FLUID.   [START ON 04/06/2021] HYDROcodone-acetaminophen (NORCO/VICODIN) 5-325 MG tablet Take 1 tablet by mouth every 12 (twelve) hours as needed for moderate pain.   [START ON 05/06/2021] HYDROcodone-acetaminophen (NORCO/VICODIN) 5-325 MG tablet Take 1 tablet by mouth every 12 (twelve) hours as needed for moderate pain.   HYDROcodone-acetaminophen (NORCO/VICODIN) 5-325 MG tablet Take 1 tablet by mouth every 12 (twelve) hours as needed for moderate pain.   levothyroxine (SYNTHROID) 50 MCG tablet Take 1 tablet (50 mcg total) by mouth daily before breakfast. **NO MORE REFILLS UNTIL PATIENT COMES IN FOR LAB WORK**   lithium carbonate (LITHOBID) 300 MG CR tablet Take 1 tablet (300 mg total) by mouth 2 (two) times daily.   pregabalin (LYRICA) 75 MG capsule Take 1 capsule (75 mg total) by  mouth 2 (two) times daily.   rOPINIRole (REQUIP) 0.25 MG tablet TAKE 1-4 TABLETS BY MOUTH 1 HOUR BEFORE BED FOR RESTLESS LEG. (Patient taking differently: Take 0.25-4 mg by mouth at bedtime as needed (restless leg).)   sertraline (ZOLOFT) 100 MG tablet Take 1 tablet (100 mg total) by mouth 2 (two) times daily.   [DISCONTINUED] lithium carbonate (LITHOBID) 300 MG CR tablet Take 1 tablet (300 mg total) by mouth 2 (two) times daily.   [DISCONTINUED] sertraline (ZOLOFT) 100 MG tablet Take 1 tablet (100  mg total) by mouth 2 (two) times daily.   No facility-administered encounter medications on file as of 03/24/2021.    No results found for this or any previous visit (from the past 2160 hour(s)).   There were no vitals taken for this visit.   Review of Systems  Cardiovascular:  Negative for chest pain.  Musculoskeletal:  Positive for joint pain.  Psychiatric/Behavioral:  Negative for substance abuse and suicidal ideas.    Mental Status Examination  Appearance:  Alert: Yes Attention: fair  Cooperative: Yes Eye Contact:  Speech: normal Psychomotor Activity: Decreased Memory/Concentration: adequate Oriented: person, place, time/date and situation Mood: Somewhat subdued Affect: congruent Thought Processes and Associations: Coherent no paranoia as of now [employer] of Knowledge: Fair Thought Content: Suicidal ideation and Homicidal ideation were denied Insight: Fair Judgement: Fair  Diagnosis: Bipolar disorder type I manic per history for psychotic features. Insomnia NOS. Adjustment disorder with anxiety . EPS Treatment Plan:  Prior documentation reviewed  1. Bipolar: depression pain can affect mood overall mood is fair, continue lithium, levels requested 2. Depression: see above, continue zoloft 3. Insomnia:doing fair . Reviewed sleep hygiene  Fu 86m  NMerian Capron MD

## 2021-04-02 ENCOUNTER — Other Ambulatory Visit (HOSPITAL_COMMUNITY): Payer: Self-pay | Admitting: Psychiatry

## 2021-04-02 DIAGNOSIS — F315 Bipolar disorder, current episode depressed, severe, with psychotic features: Secondary | ICD-10-CM

## 2021-04-12 ENCOUNTER — Telehealth: Payer: Self-pay | Admitting: Neurology

## 2021-04-12 DIAGNOSIS — Z131 Encounter for screening for diabetes mellitus: Secondary | ICD-10-CM

## 2021-04-12 DIAGNOSIS — E039 Hypothyroidism, unspecified: Secondary | ICD-10-CM

## 2021-04-12 DIAGNOSIS — Z79899 Other long term (current) drug therapy: Secondary | ICD-10-CM

## 2021-04-12 DIAGNOSIS — G894 Chronic pain syndrome: Secondary | ICD-10-CM

## 2021-04-12 DIAGNOSIS — R7309 Other abnormal glucose: Secondary | ICD-10-CM

## 2021-04-12 DIAGNOSIS — E782 Mixed hyperlipidemia: Secondary | ICD-10-CM

## 2021-04-12 NOTE — Telephone Encounter (Signed)
Patient left vm that she is going to get labs done September 22 and would like orders placed before that time to [facility]

## 2021-04-13 ENCOUNTER — Other Ambulatory Visit: Payer: Self-pay | Admitting: Physician Assistant

## 2021-04-13 DIAGNOSIS — M5116 Intervertebral disc disorders with radiculopathy, lumbar region: Secondary | ICD-10-CM

## 2021-04-13 DIAGNOSIS — G8929 Other chronic pain: Secondary | ICD-10-CM

## 2021-04-13 DIAGNOSIS — M5137 Other intervertebral disc degeneration, lumbosacral region: Secondary | ICD-10-CM

## 2021-04-13 DIAGNOSIS — M5030 Other cervical disc degeneration, unspecified cervical region: Secondary | ICD-10-CM

## 2021-04-13 DIAGNOSIS — M19012 Primary osteoarthritis, left shoulder: Secondary | ICD-10-CM

## 2021-04-13 NOTE — Telephone Encounter (Signed)
Last time patient had yearly labs was early October last year. Labs ordered. Also due for drug screen. Patient made aware labs ordered. LMOM.

## 2021-04-21 ENCOUNTER — Telehealth: Payer: Self-pay

## 2021-04-21 NOTE — Telephone Encounter (Signed)
Patient called. She use to receive bil shoulder injections. She has had both shoulders replaced since then. She would like to know if getting injections in them is still an option?

## 2021-04-22 ENCOUNTER — Other Ambulatory Visit: Payer: Self-pay | Admitting: Physician Assistant

## 2021-04-22 LAB — LITHIUM LEVEL: Lithium Lvl: 0.5 mmol/L — ABNORMAL LOW (ref 0.6–1.2)

## 2021-04-22 MED ORDER — LEVOTHYROXINE SODIUM 75 MCG PO TABS: 75.0000 ug | ORAL_TABLET | Freq: Every day | ORAL | 0 refills | Status: DC

## 2021-04-22 NOTE — Telephone Encounter (Signed)
Patient aware she needs to reach back out to her surgeon.

## 2021-04-22 NOTE — Telephone Encounter (Signed)
Not after shoulder replacements, if she is still having shoulder pain she should discuss this with the surgeon that did the replacement.

## 2021-04-22 NOTE — Progress Notes (Signed)
No diabetes. Kidney, liver, glucose look good.

## 2021-04-22 NOTE — Telephone Encounter (Signed)
Left msg for a return call from patient.  

## 2021-04-24 LAB — COMPLETE METABOLIC PANEL WITH GFR
AG Ratio: 1.6 (calc) (ref 1.0–2.5)
ALT: 25 U/L (ref 6–29)
AST: 15 U/L (ref 10–35)
Albumin: 4.3 g/dL (ref 3.6–5.1)
Alkaline phosphatase (APISO): 101 U/L (ref 37–153)
BUN: 11 mg/dL (ref 7–25)
CO2: 26 mmol/L (ref 20–32)
Calcium: 9.8 mg/dL (ref 8.6–10.4)
Chloride: 103 mmol/L (ref 98–110)
Creat: 0.97 mg/dL (ref 0.50–1.05)
Globulin: 2.7 g/dL (calc) (ref 1.9–3.7)
Glucose, Bld: 87 mg/dL (ref 65–99)
Potassium: 4.4 mmol/L (ref 3.5–5.3)
Sodium: 137 mmol/L (ref 135–146)
Total Bilirubin: 0.4 mg/dL (ref 0.2–1.2)
Total Protein: 7 g/dL (ref 6.1–8.1)
eGFR: 65 mL/min/{1.73_m2} (ref >=60)

## 2021-04-24 LAB — CBC WITH DIFFERENTIAL/PLATELET
Absolute Monocytes: 944 cells/uL (ref 200–950)
Basophils Absolute: 114 cells/uL (ref 0–200)
Basophils Relative: 0.8 %
Eosinophils Absolute: 672 cells/uL — ABNORMAL HIGH (ref 15–500)
Eosinophils Relative: 4.7 %
HCT: 45.1 % — ABNORMAL HIGH (ref 35.0–45.0)
Hemoglobin: 15.1 g/dL (ref 11.7–15.5)
Lymphs Abs: 2960 cells/uL (ref 850–3900)
MCH: 33 pg (ref 27.0–33.0)
MCHC: 33.5 g/dL (ref 32.0–36.0)
MCV: 98.5 fL (ref 80.0–100.0)
MPV: 10.1 fL (ref 7.5–12.5)
Monocytes Relative: 6.6 %
Neutro Abs: 9610 cells/uL — ABNORMAL HIGH (ref 1500–7800)
Neutrophils Relative %: 67.2 %
Platelets: 475 10*3/uL — ABNORMAL HIGH (ref 140–400)
RBC: 4.58 10*6/uL (ref 3.80–5.10)
RDW: 12 % (ref 11.0–15.0)
Total Lymphocyte: 20.7 %
WBC: 14.3 10*3/uL — ABNORMAL HIGH (ref 3.8–10.8)

## 2021-04-24 LAB — DRUG MONITORING, PANEL 6 WITH CONFIRMATION, URINE
6 Acetylmorphine: NEGATIVE ng/mL (ref <10)
Alcohol Metabolites: NEGATIVE ng/mL (ref <500)
Amphetamines: NEGATIVE ng/mL (ref <500)
Barbiturates: NEGATIVE ng/mL (ref <300)
Benzodiazepines: NEGATIVE ng/mL (ref <100)
Cocaine Metabolite: NEGATIVE ng/mL (ref <150)
Codeine: NEGATIVE ng/mL (ref <50)
Creatinine: 46.1 mg/dL (ref >=20.0)
Hydrocodone: 267 ng/mL — ABNORMAL HIGH (ref <50)
Hydromorphone: 121 ng/mL — ABNORMAL HIGH (ref <50)
Marijuana Metabolite: NEGATIVE ng/mL (ref <20)
Methadone Metabolite: NEGATIVE ng/mL (ref <100)
Morphine: NEGATIVE ng/mL (ref <50)
Norhydrocodone: 557 ng/mL — ABNORMAL HIGH (ref <50)
Opiates: POSITIVE ng/mL — AB (ref <100)
Oxidant: NEGATIVE ug/mL (ref <200)
Oxycodone: NEGATIVE ng/mL (ref <100)
Phencyclidine: NEGATIVE ng/mL (ref <25)
pH: 7.3 (ref 4.5–9.0)

## 2021-04-24 LAB — HEMOGLOBIN A1C
Hgb A1c MFr Bld: 5.1 % of total Hgb (ref <5.7)
Mean Plasma Glucose: 100 mg/dL
eAG (mmol/L): 5.5 mmol/L

## 2021-04-24 LAB — DM TEMPLATE

## 2021-04-24 LAB — TSH: TSH: 5.8 mIU/L — ABNORMAL HIGH (ref 0.40–4.50)

## 2021-04-29 ENCOUNTER — Telehealth: Payer: Self-pay

## 2021-04-29 NOTE — Telephone Encounter (Signed)
Medication: pregabalin (LYRICA) 75 MG capsule Prior authorization submitted via CoverMyMeds on 04/29/2021 [place] submission pending

## 2021-05-03 NOTE — Telephone Encounter (Signed)
Medication:  pregabalin (LYRICA) 75 MG capsule Prior authorization determination received Medication has been approved Approval dates: 08/07/2020-07/27/2021  Patient aware via: [place] aware: Yes Provider aware via this encounter

## 2021-05-12 ENCOUNTER — Other Ambulatory Visit: Payer: Self-pay

## 2021-05-12 DIAGNOSIS — G8929 Other chronic pain: Secondary | ICD-10-CM

## 2021-05-12 DIAGNOSIS — M5030 Other cervical disc degeneration, unspecified cervical region: Secondary | ICD-10-CM

## 2021-05-12 DIAGNOSIS — M5137 Other intervertebral disc degeneration, lumbosacral region: Secondary | ICD-10-CM

## 2021-05-12 DIAGNOSIS — M19012 Primary osteoarthritis, left shoulder: Secondary | ICD-10-CM

## 2021-05-12 DIAGNOSIS — M5116 Intervertebral disc disorders with radiculopathy, lumbar region: Secondary | ICD-10-CM

## 2021-05-12 NOTE — Telephone Encounter (Signed)
Pt called requesting refill on hydrocodone for her October supply.  Charyl Bigger, CMA

## 2021-05-13 MED ORDER — HYDROCODONE-ACETAMINOPHEN 5-325 MG PO TABS: 1.0000 | ORAL_TABLET | Freq: Two times a day (BID) | ORAL | 0 refills | Status: DC | PRN

## 2021-05-18 ENCOUNTER — Other Ambulatory Visit: Payer: Self-pay | Admitting: Physician Assistant

## 2021-05-18 ENCOUNTER — Ambulatory Visit (INDEPENDENT_AMBULATORY_CARE_PROVIDER_SITE_OTHER): Payer: Medicare Other

## 2021-05-18 ENCOUNTER — Other Ambulatory Visit: Payer: Self-pay

## 2021-05-18 DIAGNOSIS — Z1231 Encounter for screening mammogram for malignant neoplasm of breast: Secondary | ICD-10-CM

## 2021-05-23 NOTE — Progress Notes (Signed)
Normal mammogram. Follow up in 1 year.

## 2021-05-26 ENCOUNTER — Ambulatory Visit (INDEPENDENT_AMBULATORY_CARE_PROVIDER_SITE_OTHER): Payer: Medicare Other | Admitting: Podiatry

## 2021-05-26 ENCOUNTER — Other Ambulatory Visit: Payer: Self-pay

## 2021-05-26 ENCOUNTER — Encounter: Payer: Self-pay | Admitting: Podiatry

## 2021-05-26 DIAGNOSIS — L600 Ingrowing nail: Secondary | ICD-10-CM | POA: Diagnosis not present

## 2021-05-26 NOTE — Patient Instructions (Signed)

## 2021-05-26 NOTE — Progress Notes (Signed)
  Subjective:  Patient ID: Amber Rice, female    DOB: 04-10-1956,   MRN: [id number]  No chief complaint on file.   65 y.o. female presents for bilateral ingrowing toenails that have been present for "a while". Has been having some tingling in the toes and nails are curving in. No treatment . Denies any other pedal complaints. Denies n/v/f/c.   Past Medical History:  Diagnosis Date   Degenerative disc disease, lumbar    diagnosed in 2005   Hyperlipidemia    Impacted cerumen of left ear 04/23/2017    Objective:  Physical Exam: Vascular: DP/PT pulses 2/4 bilateral. CFT <3 seconds. Normal hair growth on digits. No edema.  Skin. No lacerations or abrasions bilateral feet. Incurvation noted to bilateral hallux nails. Pincher type nails No erythema edema or purulence noted.  Musculoskeletal: MMT 5/5 bilateral lower extremities in DF, PF, Inversion and Eversion. Deceased ROM in DF of ankle joint.  Neurological: Sensation intact to light touch.   Assessment:   1. Ingrown left greater toenail   2. Ingrown right greater toenail      Plan:  Patient was evaluated and treated and all questions answered. Patient requesting removal of ingrown nail today. Procedure below.  Discussed procedure and post procedure care and patient expressed understanding.  Will follow-up in 2 weeks for nail check or sooner if any problems arise.    Procedure:  Procedure: total Nail Avulsion of bilaterally hallux  Surgeon: Lorenda Peck, DPM  Pre-op Dx: Ingrown toenail without infection Post-op: Same  Place of Surgery: Office exam room.  Indications for surgery: Painful and ingrown toenail.  Findings: There is redness, warmth, and swelling of tissues with incurvation of nail border. Purulence noted.    The patient is requesting removal of nail with chemical matrixectomy. Risks and complications were discussed with the patient for which they understand and written consent was obtained. Under sterile  conditions a total of 3 mL of 1:1 mixture 0.5% marcaine plain and 1% lidocaine plain was infiltrated in a hallux block fashion. Once anesthetized, the skin was prepped in sterile fashion. A tourniquet was then applied. Next the bilateral  hallux nail was removed.  Next phenol was then applied under standard conditions and copiously irrigated. Silvadene was applied. A dry sterile dressing was applied. After application of the dressing the tourniquet was removed and there is found to be an immediate capillary refill time to the digit. The patient tolerated the procedure well without any complications. Post procedure instructions were discussed the patient for which he verbally understood. Follow-up in two weeks for nail check or sooner if any problems are to arise. Discussed signs/symptoms of infection and directed to call the office immediately should any occur or go directly to the emergency room. In the meantime, encouraged to call the office with any questions, concerns, changes symptoms.   Lorenda Peck, DPM

## 2021-06-07 ENCOUNTER — Ambulatory Visit (INDEPENDENT_AMBULATORY_CARE_PROVIDER_SITE_OTHER): Payer: Medicare Other | Admitting: Physician Assistant

## 2021-06-07 ENCOUNTER — Other Ambulatory Visit: Payer: Self-pay

## 2021-06-07 ENCOUNTER — Ambulatory Visit (INDEPENDENT_AMBULATORY_CARE_PROVIDER_SITE_OTHER): Payer: Medicare Other | Admitting: Sports Medicine

## 2021-06-07 VITALS — BP 136/74 | HR 104 | Ht 65.0 in | Wt 248.0 lb

## 2021-06-07 DIAGNOSIS — M5441 Lumbago with sciatica, right side: Secondary | ICD-10-CM

## 2021-06-07 DIAGNOSIS — Z96612 Presence of left artificial shoulder joint: Secondary | ICD-10-CM

## 2021-06-07 DIAGNOSIS — M5116 Intervertebral disc disorders with radiculopathy, lumbar region: Secondary | ICD-10-CM | POA: Diagnosis not present

## 2021-06-07 DIAGNOSIS — M5137 Other intervertebral disc degeneration, lumbosacral region: Secondary | ICD-10-CM

## 2021-06-07 DIAGNOSIS — F315 Bipolar disorder, current episode depressed, severe, with psychotic features: Secondary | ICD-10-CM

## 2021-06-07 DIAGNOSIS — G2581 Restless legs syndrome: Secondary | ICD-10-CM

## 2021-06-07 DIAGNOSIS — G8929 Other chronic pain: Secondary | ICD-10-CM

## 2021-06-07 DIAGNOSIS — M5442 Lumbago with sciatica, left side: Secondary | ICD-10-CM | POA: Diagnosis not present

## 2021-06-07 DIAGNOSIS — M5030 Other cervical disc degeneration, unspecified cervical region: Secondary | ICD-10-CM

## 2021-06-07 DIAGNOSIS — E039 Hypothyroidism, unspecified: Secondary | ICD-10-CM

## 2021-06-07 DIAGNOSIS — Z1211 Encounter for screening for malignant neoplasm of colon: Secondary | ICD-10-CM

## 2021-06-07 DIAGNOSIS — M19012 Primary osteoarthritis, left shoulder: Secondary | ICD-10-CM

## 2021-06-07 DIAGNOSIS — G894 Chronic pain syndrome: Secondary | ICD-10-CM

## 2021-06-07 LAB — TSH: TSH: 5.14 mIU/L — ABNORMAL HIGH (ref 0.40–4.50)

## 2021-06-07 MED ORDER — HYDROCODONE-ACETAMINOPHEN 5-325 MG PO TABS: 1.0000 | ORAL_TABLET | Freq: Two times a day (BID) | ORAL | 0 refills | Status: DC | PRN

## 2021-06-07 MED ORDER — PREGABALIN 75 MG PO CAPS: 75.0000 mg | ORAL_CAPSULE | Freq: Two times a day (BID) | ORAL | 3 refills | Status: DC

## 2021-06-07 MED ORDER — ROPINIROLE HCL 0.25 MG PO TABS: 0.2500 mg | ORAL_TABLET | Freq: Every evening | ORAL | 1 refills | Status: DC | PRN

## 2021-06-07 NOTE — Progress Notes (Signed)
    Procedures performed today:    None.  Independent interpretation of notes and tests performed by another provider:   None.  Brief History, Exam, Impression, and Recommendations:    Status post reverse arthroplasty of shoulder, left Amber Rice is a pleasant 65 year old female, she is a pack-a-day smoker, she has had a shoulder arthroplasty followed by revision to reverse. Has had multiple x-rays due to persistent pain over the past year since her replacement, x-rays have all been normal per her surgeon and it sounds like she is discharged from them. Pain is anteriorly and posteriorly at the joint line, she does have discomfort when moving her shoulder. We will go ahead and get a three-phase bone scan, I agree with Lyrica prescribed by her PCP, we will send this in again to the local [facility] as she has not been able to get it due to insurance purposes, we are going to use good Rx and she should get it for much cheaper. I do think she will need some physical therapy in the future. Return to see me to go over bone density test results.    ___________________________________________ Gwen Her. Dianah Field, M.D., ABFM., CAQSM. Primary Care and [place] Instructor of [place] [facility]

## 2021-06-07 NOTE — Assessment & Plan Note (Signed)
Amber Rice is a pleasant 65 year old female, she is a pack-a-day smoker, she has had a shoulder arthroplasty followed by revision to reverse. Has had multiple x-rays due to persistent pain over the past year since her replacement, x-rays have all been normal per her surgeon and it sounds like she is discharged from them. Pain is anteriorly and posteriorly at the joint line, she does have discomfort when moving her shoulder. We will go ahead and get a three-phase bone scan, I agree with Lyrica prescribed by her PCP, we will send this in again to the local [facility] as she has not been able to get it due to insurance purposes, we are going to use good Rx and she should get it for much cheaper. I do think she will need some physical therapy in the future. Return to see me to go over bone density test results.

## 2021-06-07 NOTE — Progress Notes (Signed)
Subjective:    Patient ID: Amber Rice, female    DOB: [birth year], 65 y.o.   MRN: [id number]  HPI Pt is a 65 yo obese female with chronic pain syndrome due to osteoarthritis, hypothyroidism, alcohol who presents to the clinic for pain medication refills.  Patient is fairly well controlled with her pain.  She is taking Norco twice a day.  She denies any concerns or complaints.  She continues to work with her orthopedic and sports medicine providers to treat pain in other ways.  She is on Lyrica 75 mg twice a day.  She just actually got this approved and started.  She is hopeful this will help as well.  Patient continues to smoke.    .. Active Ambulatory Problems    Diagnosis Date Noted   Hyperlipidemia 07/09/2013   GERD (gastroesophageal reflux disease) 07/09/2013   DDD (degenerative disc disease), lumbosacral 07/09/2013   Bipolar I disorder, current or most recent episode depressed, with psychotic features with mixed features ([place]) 07/09/2013   Osteoarthritis of left glenohumeral joint 04/30/2014   Status post reverse arthroplasty of shoulder, left 03/01/2015   Bilateral carpal tunnel syndrome 05/03/2015   DDD (degenerative disc disease), cervical 05/03/2015   Lumbar disc herniation with radiculopathy 05/03/2015   Chronic SI joint pain 09/21/2015   Leukocytosis 01/14/2016   Acquired hypothyroidism 01/14/2016   Callus 05/08/2016   Tobacco abuse 10/12/2016   Colon polyp 01/04/2018   BMI 35.0-35.9,adult 12/19/2018   Myalgia 06/23/2019   Chronic cough 07/23/2019   Arthralgia 07/23/2019   Glossitis 08/14/2019   Primary osteoarthritis of right ankle 08/21/2019   Bilateral lower extremity edema 10/22/2019   Elevated blood pressure reading 10/24/2019   Proteinuria 10/24/2019   Chronic bilateral low back pain with bilateral sciatica 11/11/2019   Weakness 12/22/2019   Fatigue 12/22/2019   Chronic venous stasis 01/19/2020   RLS (restless legs syndrome) 04/16/2020   Chronic pain  syndrome 05/05/2020   Right upper quadrant guarding 10/18/2020   Hepatic steatosis 10/18/2020   Abdominal distension (gaseous) 10/18/2020   Seasonal allergic rhinitis due to pollen 11/29/2020   Fibromyalgia 03/07/2021   Dupuytren's contracture of left hand 03/07/2021   Leg edema, right 03/07/2021   Trigger finger, left middle finger 03/17/2021   Resolved Ambulatory Problems    Diagnosis Date Noted   Low back pain with sciatica 07/09/2013   Impacted cerumen of left ear 04/23/2017   Past Medical History:  Diagnosis Date   Degenerative disc disease, lumbar     Review of Systems See HPI.     Objective:   Physical Exam Vitals reviewed.  Constitutional:      Appearance: Normal appearance. She is obese.  HENT:     Head: Normocephalic.  Cardiovascular:     Rate and Rhythm: Normal rate and regular rhythm.     Pulses: Normal pulses.     Heart sounds: Normal heart sounds.  Pulmonary:     Effort: Pulmonary effort is normal.     Comments: Coarse breath sounds.  Neurological:     General: No focal deficit present.     Mental Status: She is alert and oriented to person, place, and time.  Psychiatric:        Mood and Affect: Mood normal.          Assessment & Plan:  Marland KitchenMarland Kitchen[place]utchess was seen today for follow-up.  Diagnoses and all orders for this visit:  Chronic pain syndrome  DDD (degenerative disc disease), lumbosacral -     HYDROcodone-acetaminophen (  NORCO/VICODIN) 5-325 MG tablet; Take 1 tablet by mouth every 12 (twelve) hours as needed for moderate pain. -     HYDROcodone-acetaminophen (NORCO/VICODIN) 5-325 MG tablet; Take 1 tablet by mouth every 12 (twelve) hours as needed for moderate pain. -     HYDROcodone-acetaminophen (NORCO/VICODIN) 5-325 MG tablet; Take 1 tablet by mouth every 12 (twelve) hours as needed for moderate pain.  Chronic bilateral low back pain with bilateral sciatica -     HYDROcodone-acetaminophen (NORCO/VICODIN) 5-325 MG tablet; Take 1 tablet by mouth  every 12 (twelve) hours as needed for moderate pain. -     HYDROcodone-acetaminophen (NORCO/VICODIN) 5-325 MG tablet; Take 1 tablet by mouth every 12 (twelve) hours as needed for moderate pain. -     HYDROcodone-acetaminophen (NORCO/VICODIN) 5-325 MG tablet; Take 1 tablet by mouth every 12 (twelve) hours as needed for moderate pain.  Lumbar disc herniation with radiculopathy -     HYDROcodone-acetaminophen (NORCO/VICODIN) 5-325 MG tablet; Take 1 tablet by mouth every 12 (twelve) hours as needed for moderate pain. -     HYDROcodone-acetaminophen (NORCO/VICODIN) 5-325 MG tablet; Take 1 tablet by mouth every 12 (twelve) hours as needed for moderate pain. -     HYDROcodone-acetaminophen (NORCO/VICODIN) 5-325 MG tablet; Take 1 tablet by mouth every 12 (twelve) hours as needed for moderate pain.  DDD (degenerative disc disease), cervical -     HYDROcodone-acetaminophen (NORCO/VICODIN) 5-325 MG tablet; Take 1 tablet by mouth every 12 (twelve) hours as needed for moderate pain. -     HYDROcodone-acetaminophen (NORCO/VICODIN) 5-325 MG tablet; Take 1 tablet by mouth every 12 (twelve) hours as needed for moderate pain. -     HYDROcodone-acetaminophen (NORCO/VICODIN) 5-325 MG tablet; Take 1 tablet by mouth every 12 (twelve) hours as needed for moderate pain.  Osteoarthritis of left glenohumeral joint -     HYDROcodone-acetaminophen (NORCO/VICODIN) 5-325 MG tablet; Take 1 tablet by mouth every 12 (twelve) hours as needed for moderate pain. -     HYDROcodone-acetaminophen (NORCO/VICODIN) 5-325 MG tablet; Take 1 tablet by mouth every 12 (twelve) hours as needed for moderate pain. -     HYDROcodone-acetaminophen (NORCO/VICODIN) 5-325 MG tablet; Take 1 tablet by mouth every 12 (twelve) hours as needed for moderate pain.  RLS (restless legs syndrome) -     rOPINIRole (REQUIP) 0.25 MG tablet; Take 1-4 tablets (0.25-1 mg total) by mouth at bedtime as needed (restless leg).  Bipolar I disorder, current or most  recent episode depressed, with psychotic features with mixed features ([place])  Acquired hypothyroidism -     TSH  .Marland KitchenPDMP reviewed during this encounter. UDS and pain contract UTD.  No concerns.  Refilled for 3 months.  Next follow up in 3 months as dictated by [place] controlled substance laws.   Repeat TSH to see how dose adjustment is going.

## 2021-06-08 ENCOUNTER — Encounter: Payer: Self-pay | Admitting: Physician Assistant

## 2021-06-08 ENCOUNTER — Other Ambulatory Visit: Payer: Self-pay | Admitting: Physician Assistant

## 2021-06-08 MED ORDER — LEVOTHYROXINE SODIUM 88 MCG PO TABS
88.0000 ug | ORAL_TABLET | Freq: Every day | ORAL | 0 refills | Status: DC
Start: 2021-06-08 — End: 2021-09-19

## 2021-06-08 NOTE — Progress Notes (Signed)
TSH still not to goal. Increased levothyroxine to 70mcg. Will recheck in 3 months.

## 2021-06-09 ENCOUNTER — Ambulatory Visit (INDEPENDENT_AMBULATORY_CARE_PROVIDER_SITE_OTHER): Payer: Medicare Other | Admitting: Podiatry

## 2021-06-09 ENCOUNTER — Encounter: Payer: Self-pay | Admitting: Podiatry

## 2021-06-09 ENCOUNTER — Other Ambulatory Visit: Payer: Self-pay | Admitting: Neurology

## 2021-06-09 ENCOUNTER — Other Ambulatory Visit: Payer: Self-pay

## 2021-06-09 DIAGNOSIS — L600 Ingrowing nail: Secondary | ICD-10-CM | POA: Diagnosis not present

## 2021-06-09 DIAGNOSIS — G2581 Restless legs syndrome: Secondary | ICD-10-CM

## 2021-06-09 MED ORDER — CEPHALEXIN 500 MG PO CAPS: 500.0000 mg | ORAL_CAPSULE | Freq: Four times a day (QID) | ORAL | 0 refills | Status: AC

## 2021-06-09 MED ORDER — ROPINIROLE HCL 0.25 MG PO TABS: 0.2500 mg | ORAL_TABLET | Freq: Every evening | ORAL | 1 refills | Status: DC | PRN

## 2021-06-09 NOTE — Progress Notes (Signed)
  Subjective:  Patient ID: Amber Rice, female    DOB: May 22, 1956,   MRN: [id number]  Chief Complaint  Patient presents with   Nail Problem    2 week nail f/u - Patient states both toenail were fine until 06/08/21 at 8pm she notice some redness. Patient denies and discharge , pain or swelling.     65 y.o. female presents for follow-up of bilateral hallux nail avulsions. Patient relates she was doing really well until 8 pm last night she noticed some redness in the toes and was concerned. Has been soaking twice a day and doing neosporin and a band aid.  . Denies any other pedal complaints. Denies n/v/f/c.   Past Medical History:  Diagnosis Date   Degenerative disc disease, lumbar    diagnosed in 2005   Hyperlipidemia    Impacted cerumen of left ear 04/23/2017    Objective:  Physical Exam: Vascular: DP/PT pulses 2/4 bilateral. CFT <3 seconds. Normal hair growth on digits. No edema.  Skin. No lacerations or abrasions bilateral feet. Bilateral hallux nail beds with healing well with some mild maceration noted. Some erythema and edema noted to proximal nail fold.  Musculoskeletal: MMT 5/5 bilateral lower extremities in DF, PF, Inversion and Eversion. Deceased ROM in DF of ankle joint.  Neurological: Sensation intact to light touch.   Assessment:   1. Ingrown right greater toenail   2. Ingrown left greater toenail      Plan:  Patient was evaluated and treated and all questions answered. Toe was evaluated and appears to be healing well. There is some concern for new redness. Keflex sent to pharmacy.  Recommend soaking every other day and taking band aid off at night.  Patient to follow-up as needed.    Lorenda Peck, DPM

## 2021-06-09 NOTE — Progress Notes (Signed)
Patient left vm stating RX too expensive for Requip at [facility]. Needs sent to [facility]. RX sent. Patient made aware.

## 2021-06-15 ENCOUNTER — Other Ambulatory Visit (HOSPITAL_COMMUNITY): Payer: Medicare Other

## 2021-07-08 ENCOUNTER — Telehealth (INDEPENDENT_AMBULATORY_CARE_PROVIDER_SITE_OTHER): Payer: Medicare Other | Admitting: Psychiatry

## 2021-07-08 ENCOUNTER — Encounter (HOSPITAL_COMMUNITY): Payer: Self-pay | Admitting: Psychiatry

## 2021-07-08 DIAGNOSIS — F315 Bipolar disorder, current episode depressed, severe, with psychotic features: Secondary | ICD-10-CM | POA: Diagnosis not present

## 2021-07-08 DIAGNOSIS — G8929 Other chronic pain: Secondary | ICD-10-CM | POA: Diagnosis not present

## 2021-07-08 DIAGNOSIS — F5102 Adjustment insomnia: Secondary | ICD-10-CM

## 2021-07-08 MED ORDER — SERTRALINE HCL 100 MG PO TABS: 100.0000 mg | ORAL_TABLET | Freq: Two times a day (BID) | ORAL | 0 refills | Status: DC

## 2021-07-08 MED ORDER — LITHIUM CARBONATE ER 300 MG PO TBCR: 300.0000 mg | EXTENDED_RELEASE_TABLET | Freq: Every day | ORAL | 0 refills | Status: DC

## 2021-07-08 MED ORDER — LAMOTRIGINE 25 MG PO TABS: 25.0000 mg | ORAL_TABLET | Freq: Every day | ORAL | 0 refills | Status: DC

## 2021-07-08 NOTE — Progress Notes (Signed)
Patient ID: Amber Rice, female   DOB: 13-Aug-1955, 65 y.o.   MRN: [id number] [place] [id number] 65 y.o.  07/08/2021  Chief Complaint: Follow up bipolar disorder. Med review, discuss lithium     History of Present Illness:   Diagnosed with bipolar disorder depressed. Anxiety disorder NOS. Insomnia  Has back pain and follows up with Dr. [name] or [name].  Has been doing fair but TSH remains high wants to discuss lithium or alternative, has been stable on lithium for more then 10 years Takes zolof for depression    No side effects  no paranoia  Duration of bipolar :12 plus years  Aggravating factor : pain and shoulder surgeries depression scale 7/10 . 10 being no depression  Modifying factors; walking, reading books   Past Medical History:  Diagnosis Date   Degenerative disc disease, lumbar    diagnosed in 2005   Hyperlipidemia    Impacted cerumen of left ear 04/23/2017   Family History  Problem Relation Age of Onset   Cancer Mother    Heart attack Father    Hyperlipidemia Father    Cancer Maternal Grandmother    Cancer Paternal Grandmother    Bipolar disorder Paternal Uncle        Committed suicide in his 54's   Dementia Paternal Uncle    Depression Cousin    Schizophrenia Cousin    Multiple sclerosis Sister    Breast cancer Cousin    Alcohol abuse Neg Hx    Anxiety disorder Neg Hx     Outpatient Encounter Medications as of 07/08/2021  Medication Sig   lamoTRIgine (LAMICTAL) 25 MG tablet Take 1 tablet (25 mg total) by mouth daily. Take one tablet daily for a week and then start taking 2 tablets.   HYDROcodone-acetaminophen (NORCO/VICODIN) 5-325 MG tablet Take 1 tablet by mouth every 12 (twelve) hours as needed for moderate pain.   HYDROcodone-acetaminophen (NORCO/VICODIN) 5-325 MG tablet Take 1 tablet by mouth every 12 (twelve) hours as needed for moderate pain.   [START ON 08/04/2021]  HYDROcodone-acetaminophen (NORCO/VICODIN) 5-325 MG tablet Take 1 tablet by mouth every 12 (twelve) hours as needed for moderate pain.   levothyroxine (SYNTHROID) 88 MCG tablet Take 1 tablet (88 mcg total) by mouth daily.   lithium carbonate (LITHOBID) 300 MG CR tablet Take 1 tablet (300 mg total) by mouth daily.   pregabalin (LYRICA) 75 MG capsule Take 1 capsule (75 mg total) by mouth 2 (two) times daily.   rOPINIRole (REQUIP) 0.25 MG tablet Take 1-4 tablets (0.25-1 mg total) by mouth at bedtime as needed (restless leg).   sertraline (ZOLOFT) 100 MG tablet Take 1 tablet (100 mg total) by mouth 2 (two) times daily.   [DISCONTINUED] lithium carbonate (LITHOBID) 300 MG CR tablet Take 1 tablet (300 mg total) by mouth 2 (two) times daily.   [DISCONTINUED] sertraline (ZOLOFT) 100 MG tablet Take 1 tablet (100 mg total) by mouth 2 (two) times daily.   No facility-administered encounter medications on file as of 07/08/2021.    Recent Results (from the past 2160 hour(s))  TSH     Status: Abnormal   Collection Time: 04/21/21 11:05 AM  Result Value Ref Range   TSH 5.80 (H) 0.40 - 4.50 mIU/L  COMPLETE METABOLIC PANEL WITH GFR     Status: None   Collection Time: 04/21/21 11:05 AM  Result Value Ref Range   Glucose, Bld 87 65 - 99 mg/dL    Comment: .  Fasting reference interval .    BUN 11 7 - 25 mg/dL   Creat 0.97 0.50 - 1.05 mg/dL   eGFR 65 > OR = 60 mL/min/1.62m    Comment: The eGFR is based on the CKD-EPI 2021 equation. To calculate  the new eGFR from a previous Creatinine or Cystatin C result, go to https://www.kidney.org/professionals/ kdoqi/gfr%5Fcalculator    BUN/Creatinine Ratio NOT APPLICABLE 6 - 22 (calc)   Sodium 137 135 - 146 mmol/L   Potassium 4.4 3.5 - 5.3 mmol/L   Chloride 103 98 - 110 mmol/L   CO2 26 20 - 32 mmol/L   Calcium 9.8 8.6 - 10.4 mg/dL   Total Protein 7.0 6.1 - 8.1 g/dL   Albumin 4.3 3.6 - 5.1 g/dL   Globulin 2.7 1.9 - 3.7 g/dL (calc)   AG Ratio 1.6 1.0  - 2.5 (calc)   Total Bilirubin 0.4 0.2 - 1.2 mg/dL   Alkaline phosphatase (APISO) 101 37 - 153 U/L   AST 15 10 - 35 U/L   ALT 25 6 - 29 U/L  CBC with Differential/Platelet     Status: Abnormal   Collection Time: 04/21/21 11:05 AM  Result Value Ref Range   WBC 14.3 (H) 3.8 - 10.8 Thousand/uL   RBC 4.58 3.80 - 5.10 Million/uL   Hemoglobin 15.1 11.7 - 15.5 g/dL   HCT 45.1 (H) 35.0 - 45.0 %   MCV 98.5 80.0 - 100.0 fL   MCH 33.0 27.0 - 33.0 pg   MCHC 33.5 32.0 - 36.0 g/dL   RDW 12.0 11.0 - 15.0 %   Platelets 475 (H) 140 - 400 Thousand/uL   MPV 10.1 7.5 - 12.5 fL   Neutro Abs 9,610 (H) 1,500 - 7,800 cells/uL   Lymphs Abs 2,960 850 - 3,900 cells/uL   Absolute Monocytes 944 200 - 950 cells/uL   Eosinophils Absolute 672 (H) 15 - 500 cells/uL   Basophils Absolute 114 0 - 200 cells/uL   Neutrophils Relative % 67.2 %   Total Lymphocyte 20.7 %   Monocytes Relative 6.6 %   Eosinophils Relative 4.7 %   Basophils Relative 0.8 %  Hemoglobin A1c     Status: None   Collection Time: 04/21/21 11:05 AM  Result Value Ref Range   Hgb A1c MFr Bld 5.1 <5.7 % of total Hgb    Comment: For the purpose of screening for the presence of diabetes: . <5.7%       Consistent with the absence of diabetes 5.7-6.4%    Consistent with increased risk for diabetes             (prediabetes) > or =6.5%  Consistent with diabetes . This assay result is consistent with a decreased risk of diabetes. . Currently, no consensus exists regarding use of hemoglobin A1c for diagnosis of diabetes in children. . According to American Diabetes Association (ADA) guidelines, hemoglobin A1c <7.0% represents optimal control in non-pregnant diabetic patients. Different metrics may apply to specific patient populations.  Standards of Medical Care in Diabetes(ADA). .    Mean Plasma Glucose 100 mg/dL   eAG (mmol/L) 5.5 mmol/L  DRUG MONITORING, PANEL 6 WITH CONFIRMATION, URINE     Status: Abnormal   Collection Time: 04/21/21  11:05 AM  Result Value Ref Range   Alcohol Metabolites NEGATIVE <500 ng/mL   Amphetamines NEGATIVE <500 ng/mL   Barbiturates NEGATIVE <300 ng/mL   Benzodiazepines NEGATIVE <100 ng/mL   Cocaine Metabolite NEGATIVE <150 ng/mL   6 Acetylmorphine NEGATIVE <10 ng/mL  Marijuana Metabolite NEGATIVE <20 ng/mL   Methadone Metabolite NEGATIVE <100 ng/mL   Opiates POSITIVE (A) <100 ng/mL    Comment: . => Indicates changed result(s) or information. Marland Kitchen PLEASE DISREGARD PREVIOUSLY REPORTED INFORMATION BELOW:  (The information below was originally reported on 04/23/2021 at 1:49 AM) Opiates     PEND       medMATCH Opiates      Comment: . => Indicates changed result(s) or information. Marland Kitchen PLEASE DISREGARD PREVIOUSLY REPORTED INFORMATION BELOW:  (The information below was originally reported on 04/23/2021 at 1:49 AM) medMATCH Opiates     PEND       Codeine NEGATIVE <50 ng/mL    Comment: . => Indicates changed result(s) or information. Marland Kitchen PLEASE DISREGARD PREVIOUSLY REPORTED INFORMATION BELOW:  (The information below was originally reported on 04/23/2021 at 1:49 AM)   Codeine     PEND    . This test was developed and its analytical performance  characteristics have been determined by [employer]. It has not been cleared or approved by the FDA. This assay has been validated pursuant to the CLIA  regulations and is used for clinical purposes. . (The information below was originally reported on 04/23/2021 at 1:49 AM)   Codeine     PEND    . This test was developed and its analytical performance  characteristics have been determined by [employer]. It has not been cleared or approved by the FDA. This assay has been validated pursuant to the CLIA  regulations and is used for clinical purposes.    medMATCH Codeine      Comment: . => Indicates changed result(s) or information. Marland Kitchen PLEASE DISREGARD PREVIOUSLY REPORTED INFORMATION BELOW:  (The information below was originally  reported on 04/23/2021 at 1:49 AM) [facility] Codeine     PEND       Hydrocodone 267 (H) <50 ng/mL    Comment: . => Indicates changed result(s) or information. Marland Kitchen PLEASE DISREGARD PREVIOUSLY REPORTED INFORMATION BELOW:  (The information below was originally reported on 04/23/2021 at 1:49 AM)   Hydrocodone     PEND    . This test was developed and its analytical performance  characteristics have been determined by [employer]. It has not been cleared or approved by the FDA. This assay has been validated pursuant to the CLIA  regulations and is used for clinical purposes. . (The information below was originally reported on 04/23/2021 at 1:49 AM)   Hydrocodone     PEND    . This test was developed and its analytical performance  characteristics have been determined by [employer]. It has not been cleared or approved by the FDA. This assay has been validated pursuant to the CLIA  regulations and is used for clinical purposes.    medMATCH Hydrocodone      Comment: . => Indicates changed result(s) or information. Marland Kitchen PLEASE DISREGARD PREVIOUSLY REPORTED INFORMATION BELOW:  (The information below was originally reported on 04/23/2021 at 1:49 AM) [facility] Hydrocodone     PEND       Hydromorphone 121 (H) <50 ng/mL    Comment: . => Indicates changed result(s) or information. Marland Kitchen PLEASE DISREGARD PREVIOUSLY REPORTED INFORMATION BELOW:  (The information below was originally reported on 04/23/2021 at 1:49 AM)   Hydromorphone     PEND    . This test was developed and its analytical performance  characteristics have been determined by [employer]. It has not been cleared or approved by the FDA.  This assay has been validated pursuant to the CLIA  regulations and is used for clinical purposes. . (The information below was originally reported on 04/23/2021 at 1:49 AM)   Hydromorphone     PEND    . This test was developed and its analytical performance  characteristics  have been determined by [employer]. It has not been cleared or approved by the FDA. This assay has been validated pursuant to the CLIA  regulations and is used for clinical purposes.    medMATCH Hydromorphone      Comment: . => Indicates changed result(s) or information. Marland Kitchen PLEASE DISREGARD PREVIOUSLY REPORTED INFORMATION BELOW:  (The information below was originally reported on 04/23/2021 at 1:49 AM) [facility] Hydromorphone     PEND       Morphine NEGATIVE <50 ng/mL    Comment: . => Indicates changed result(s) or information. Marland Kitchen PLEASE DISREGARD PREVIOUSLY REPORTED INFORMATION BELOW:  (The information below was originally reported on 04/23/2021 at 1:49 AM)   Morphine     PEND    . This test was developed and its analytical performance  characteristics have been determined by [employer]. It has not been cleared or approved by the FDA. This assay has been validated pursuant to the CLIA  regulations and is used for clinical purposes. . (The information below was originally reported on 04/23/2021 at 1:49 AM)   Morphine     PEND    . This test was developed and its analytical performance  characteristics have been determined by [employer]. It has not been cleared or approved by the FDA. This assay has been validated pursuant to the CLIA  regulations and is used for clinical purposes.    medMATCH Morphine      Comment: . => Indicates changed result(s) or information. Marland Kitchen PLEASE DISREGARD PREVIOUSLY REPORTED INFORMATION BELOW:  (The information below was originally reported on 04/23/2021 at 1:49 AM) [facility] Morphine     PEND       Norhydrocodone 557 (H) <50 ng/mL    Comment: . => Indicates changed result(s) or information. Marland Kitchen PLEASE DISREGARD PREVIOUSLY REPORTED INFORMATION BELOW:  (The information below was originally reported on 04/23/2021 at 1:49 AM)   Norhydrocodone     PEND    . This test was developed and its analytical performance   characteristics have been determined by [employer]. It has not been cleared or approved by the FDA. This assay has been validated pursuant to the CLIA  regulations and is used for clinical purposes. . (The information below was originally reported on 04/23/2021 at 1:49 AM)   Norhydrocodone     PEND    . This test was developed and its analytical performance  characteristics have been determined by [employer]. It has not been cleared or approved by the FDA. This assay has been validated pursuant to the CLIA  regulations and is used for clinical purposes.    medMATCH Norhydrocodone      Comment: . => Indicates changed result(s) or information. Marland Kitchen PLEASE DISREGARD PREVIOUSLY REPORTED INFORMATION BELOW:  (The information below was originally reported on 04/23/2021 at 1:49 AM) [facility] Norhydrocodone     PEND       Opiates Comments      Comment: See Opiates Notes, LDT Notes . => Indicates changed result(s) or information. Marland Kitchen PLEASE DISREGARD PREVIOUSLY REPORTED INFORMATION BELOW:  (The information below was originally reported on 04/23/2021 at 1:49 AM) Opiates Comments     PEND  Oxycodone NEGATIVE <100 ng/mL   Phencyclidine NEGATIVE <25 ng/mL   Creatinine 46.1 > or = 20.0 mg/dL   pH 7.3 4.5 - 9.0   Oxidant NEGATIVE <200 mcg/mL  DM TEMPLATE     Status: None   Collection Time: 04/21/21 11:05 AM  Result Value Ref Range   Notes and Comments      Comment: This drug testing is for medical treatment only. Analysis was performed as non-forensic testing and these results should be used only by healthcare providers to render diagnosis or treatment, or to monitor progress of medical conditions. . Opiates Notes: Hydrocodone, Norhydrocodone, Hydromorphone detected is  consistent with the use of the drug Hydrocodone. . Hydromorphone detected is consistent with the use of  the drug Hydromorphone. . Hydromorphone can be a prescribed drug and is also a  metabolite  of Hydrocodone. Marland Kitchen LDT Notes: Confirmation tests were developed and their analytical  performance characteristics have been determined by  [employer]. It has not been cleared or approved  by the FDA. This assay has been validated pursuant to  the CLIA regulations and is used for clinical purposes. . . Healthcare Providers needing Interpretation assistance,  please contact us at [phone] ([phone])  M-F, 8am to 10pm EST   Lithium level     Status: Abnormal   Collection Time: 04/21/21 11:10 AM  Result Value Ref Range   Lithium Lvl 0.5 (L) 0.6 - 1.2 mmol/L  TSH     Status: Abnormal   Collection Time: 06/07/21 11:27 AM  Result Value Ref Range   TSH 5.14 (H) 0.40 - 4.50 mIU/L     There were no vitals taken for this visit.   Review of Systems  Cardiovascular:  Negative for chest pain.  Musculoskeletal:  Positive for joint pain.  Psychiatric/Behavioral:  Negative for substance abuse and suicidal ideas.    Mental Status Examination  Appearance:  Alert: Yes Attention: fair  Cooperative: Yes Eye Contact:  Speech: normal Psychomotor Activity: Decreased Memory/Concentration: adequate Oriented: person, place, time/date and situation Mood: fair Affect: congruent Thought Processes and Associations: Coherent no paranoia as of now [employer] of Knowledge: Fair Thought Content: Suicidal ideation and Homicidal ideation were denied Insight: Fair Judgement: Fair  Diagnosis: Bipolar disorder type I manic per history for psychotic features. Insomnia NOS. Adjustment disorder with anxiety . EPS Treatment Plan:  Prior documentation reviewed   1. Bipolar: depression : remains stable wants to alternate med due to thyroid . Can lower lithium to 319m qd instead of bid, will start lamictal increase to 560min one week, reassess in one month before stopping lithium or reducing dose further Call earlier for concerns 2. Depression:doing fair, continue zoloft , see above  3.  Insomnia:doing fair . Reviewed sleep hygiene  Fu [id number] meds added lamictal discussed rash  NadMerian CapronD

## 2021-07-21 ENCOUNTER — Telehealth (HOSPITAL_COMMUNITY): Payer: Self-pay

## 2021-07-21 NOTE — Telephone Encounter (Signed)
Informed patient of everything Dr. [name] stated in previous message. She says she will lower dose and speak with Dr. Olena Heckle at next office visit

## 2021-07-21 NOTE — Telephone Encounter (Signed)
Patient is experiencing neck pain and has been since yesterday. She thinks it's coming from the Lamictal. She wants to stop taking it. Patient wants to know how to stop taking it. Please advise.

## 2021-07-25 ENCOUNTER — Telehealth (HOSPITAL_COMMUNITY): Payer: Medicare Other | Admitting: Psychiatry

## 2021-07-30 ENCOUNTER — Other Ambulatory Visit (HOSPITAL_COMMUNITY): Payer: Self-pay | Admitting: Psychiatry

## 2021-08-10 ENCOUNTER — Encounter (HOSPITAL_COMMUNITY): Payer: Self-pay | Admitting: Psychiatry

## 2021-08-10 ENCOUNTER — Telehealth (INDEPENDENT_AMBULATORY_CARE_PROVIDER_SITE_OTHER): Payer: Medicare Other | Admitting: Psychiatry

## 2021-08-10 DIAGNOSIS — F5102 Adjustment insomnia: Secondary | ICD-10-CM

## 2021-08-10 DIAGNOSIS — G8929 Other chronic pain: Secondary | ICD-10-CM

## 2021-08-10 DIAGNOSIS — F315 Bipolar disorder, current episode depressed, severe, with psychotic features: Secondary | ICD-10-CM | POA: Diagnosis not present

## 2021-08-10 MED ORDER — LAMOTRIGINE 25 MG PO TABS: 75.0000 mg | ORAL_TABLET | Freq: Every day | ORAL | 1 refills | Status: DC

## 2021-08-10 NOTE — Progress Notes (Signed)
Patient ID: Amber Rice, female   DOB: 1956-01-21, 66 y.o.   MRN: [id number] [place] [id number] 66 y.o.  08/10/2021  Virtual Visit via Telephone Note  I connected with Amber Rice on 08/10/21 at  9:30 AM EST by telephone and verified that I am speaking with the correct person using two identifiers.  Location: Patient: home Provider: home office   I discussed the limitations, risks, security and privacy concerns of performing an evaluation and management service by telephone and the availability of in person appointments. I also discussed with the patient that there may be a patient responsible charge related to this service. The patient expressed understanding and agreed to proceed.      I discussed the assessment and treatment plan with the patient. The patient was provided an opportunity to ask questions and all were answered. The patient agreed with the plan and demonstrated an understanding of the instructions.   The patient was advised to call back or seek an in-person evaluation if the symptoms worsen or if the condition fails to improve as anticipated.  I provided 20 minutes of non-face-to-face time during this encounter including chart review, adjustment of meds and documentation.    Chief Complaint: Follow up bipolar disorder. Med review, discuss lithium     History of Present Illness:   Diagnosed with bipolar disorder depressed. Anxiety disorder NOS. Insomnia  Has back pain and follows up with Dr. Georgina Rice or Amber Rice.   Have discussed to lower and dc lithium last visit, now at one a day, lamictal was started had some neck problems but resolve and now at 50mg  Denies side efects  Mood is fair Monitoring TSH and that being one reason to dc lithium  Duration of bipolar :15 years  Aggravating factor : pain and shoulder surgeries depression scale 7/10 . 10 being no depression  Modifying factors; walking,  reading bood  Past Medical History:  Diagnosis Date   Degenerative disc disease, lumbar    diagnosed in 2005   Hyperlipidemia    Impacted cerumen of left ear 04/23/2017   Family History  Problem Relation Age of Onset   Cancer Mother    Heart attack Father    Hyperlipidemia Father    Cancer Maternal Grandmother    Cancer Paternal Grandmother    Bipolar disorder Paternal Uncle        Committed suicide in his 51's   Dementia Paternal Uncle    Depression Cousin    Schizophrenia Cousin    Multiple sclerosis Sister    Breast cancer Cousin    Alcohol abuse Neg Hx    Anxiety disorder Neg Hx     Outpatient Encounter Medications as of 08/10/2021  Medication Sig   HYDROcodone-acetaminophen (NORCO/VICODIN) 5-325 MG tablet Take 1 tablet by mouth every 12 (twelve) hours as needed for moderate pain.   HYDROcodone-acetaminophen (NORCO/VICODIN) 5-325 MG tablet Take 1 tablet by mouth every 12 (twelve) hours as needed for moderate pain.   HYDROcodone-acetaminophen (NORCO/VICODIN) 5-325 MG tablet Take 1 tablet by mouth every 12 (twelve) hours as needed for moderate pain.   lamoTRIgine (LAMICTAL) 25 MG tablet Take 3 tablets (75 mg total) by mouth daily.   levothyroxine (SYNTHROID) 88 MCG tablet Take 1 tablet (88 mcg total) by mouth daily.   pregabalin (LYRICA) 75 MG capsule Take 1 capsule (75 mg total) by mouth 2 (two) times daily.   rOPINIRole (REQUIP) 0.25 MG tablet Take 1-4 tablets (0.25-1 mg total) by mouth at  bedtime as needed (restless leg).   sertraline (ZOLOFT) 100 MG tablet Take 1 tablet (100 mg total) by mouth 2 (two) times daily.   [DISCONTINUED] lamoTRIgine (LAMICTAL) 25 MG tablet Take 1 tablet (25 mg total) by mouth daily.   [DISCONTINUED] lithium carbonate (LITHOBID) 300 MG CR tablet Take 1 tablet (300 mg total) by mouth daily.   No facility-administered encounter medications on file as of 08/10/2021.    Recent Results (from the past 2160 hour(s))  TSH     Status: Abnormal    Collection Time: 06/07/21 11:27 AM  Result Value Ref Range   TSH 5.14 (H) 0.40 - 4.50 mIU/L     There were no vitals taken for this visit.   Review of Systems  Cardiovascular:  Negative for chest pain.  Musculoskeletal:  Positive for joint pain.  Psychiatric/Behavioral:  Negative for substance abuse and suicidal ideas.    Mental Status Examination  Appearance:  Alert: Yes Attention: fair  Cooperative: Yes Eye Contact:  Speech: normal Psychomotor Activity: Decreased Memory/Concentration: adequate Oriented: person, place, time/date and situation Mood: fair Affect: congruent Thought Processes and Associations: Coherent no paranoia as of now [employer] of Knowledge: Fair Thought Content: Suicidal ideation and Homicidal ideation were denied Insight: Fair Judgement: Fair  Diagnosis: Bipolar disorder type I manic per history for psychotic features. Insomnia NOS. Adjustment disorder with anxiety . EPS Treatment Plan:  Prior documentation reviewed    1. Bipolar: depression : doing fair , can stop lithium has cut down from last visit, increase lamictal to  75mg   Call earlier for concerns 2. Depression: manageable, continue zoloft, lamictal 3. Insomnia: manageable,   Fu 33m in office,  Amber Rice

## 2021-08-22 ENCOUNTER — Telehealth (HOSPITAL_COMMUNITY): Payer: Self-pay

## 2021-08-22 MED ORDER — OXCARBAZEPINE 150 MG PO TABS: 150.0000 mg | ORAL_TABLET | Freq: Every day | ORAL | 0 refills | Status: DC

## 2021-08-22 NOTE — Telephone Encounter (Signed)
Patient would like for Dr. [name] to call her. She says the Lamictal is causing to many side effects; headache, nausea, rash, dizziness, stiff neck and leg swelling. She stopped taking it on last Friday and wants to know what she should do. Last appt was on 08/10/21  Call back# [phone]

## 2021-09-06 NOTE — Telephone Encounter (Signed)
Patient says she is doing well on the Trileptal and wanted Dr. [name] to know. Nothing further is needed at this time

## 2021-09-14 ENCOUNTER — Other Ambulatory Visit: Payer: Self-pay | Admitting: Neurology

## 2021-09-14 ENCOUNTER — Other Ambulatory Visit (HOSPITAL_COMMUNITY): Payer: Self-pay | Admitting: Psychiatry

## 2021-09-14 MED ORDER — HYDROCODONE-ACETAMINOPHEN 7.5-325 MG PO TABS: 1.0000 | ORAL_TABLET | Freq: Two times a day (BID) | ORAL | 0 refills | Status: DC | PRN

## 2021-09-14 NOTE — Telephone Encounter (Signed)
Patient called and left a vm stating that the pharmacy does not have Hydrocodone 5-325 mg. She has called multiple pharmacies and was told this is out of stock. She has been out of medication for 5 days and is asking for the hydrocodone 7.5-325 mg while this is unavailable. Please advise.

## 2021-09-15 NOTE — Telephone Encounter (Signed)
Tried to call patient to make her aware with no answer.

## 2021-09-15 NOTE — Telephone Encounter (Signed)
LVM letting her know RX sent.

## 2021-09-20 ENCOUNTER — Ambulatory Visit: Payer: Medicare Other | Admitting: Physician Assistant

## 2021-09-20 ENCOUNTER — Emergency Department (HOSPITAL_COMMUNITY)
Admission: EM | Admit: 2021-09-20 | Discharge: 2021-09-20 | Disposition: A | Discharge status: Home / Self Care | Payer: Medicare Other | Attending: Emergency Medicine | Admitting: Emergency Medicine

## 2021-09-20 ENCOUNTER — Other Ambulatory Visit: Payer: Self-pay

## 2021-09-20 ENCOUNTER — Encounter (HOSPITAL_COMMUNITY): Payer: Self-pay

## 2021-09-20 ENCOUNTER — Ambulatory Visit (HOSPITAL_COMMUNITY): Payer: Medicare Other | Admitting: Psychiatry

## 2021-09-20 DIAGNOSIS — R531 Weakness: Secondary | ICD-10-CM | POA: Diagnosis present

## 2021-09-20 DIAGNOSIS — F17210 Nicotine dependence, cigarettes, uncomplicated: Secondary | ICD-10-CM | POA: Diagnosis not present

## 2021-09-20 DIAGNOSIS — R1110 Vomiting, unspecified: Secondary | ICD-10-CM | POA: Insufficient documentation

## 2021-09-20 DIAGNOSIS — Z5321 Procedure and treatment not carried out due to patient leaving prior to being seen by health care provider: Secondary | ICD-10-CM | POA: Insufficient documentation

## 2021-09-20 NOTE — ED Notes (Signed)
Pt requests this nurse to call her a taxi. Advised pt that there are no taxi services in this area after 5 pm. Pt requests EMS and PD to take her home. Advised pt that due to short staffing EMS and PD are unable to take her home. Pt reports she lost her cell phone. Provided pt with portable phone to call neighbor or family for transportation.

## 2021-09-20 NOTE — ED Notes (Signed)
Pt requests to leave AMA and expresses anger due to this nurse not calling a taxi service for her. Advised pt that there are no taxi services available at this time. Pt requested saline lock to be removed and left AMA.

## 2021-09-20 NOTE — ED Triage Notes (Signed)
Pt has multiple complaints and has trouble focusing on one complaint. Pt states she has not taken her tegretol in one week due to side effects. Pt is manic at this time. Per ems pt was drinking soda with cigarette butts in bottle. Pt vomited after drinking soda.

## 2021-09-21 ENCOUNTER — Telehealth: Payer: Self-pay | Admitting: General Practice

## 2021-09-21 NOTE — Telephone Encounter (Signed)
Transition Care Management Unsuccessful Follow-up Telephone Call  Date of discharge and from where:  09/20/21 from [facility]  Attempts:  1st Attempt  Reason for unsuccessful TCM follow-up call:  Left voice message Patient left AMA.

## 2021-09-23 NOTE — Telephone Encounter (Signed)
Transition Care Management Unsuccessful Follow-up Telephone Call  Date of discharge and from where:  09/20/21 from [facility]  Attempts:  2nd Attempt  Reason for unsuccessful TCM follow-up call:  Left voice message patient left ama

## 2021-09-28 NOTE — Telephone Encounter (Signed)
Transition Care Management Unsuccessful Follow-up Telephone Call  Date of discharge and from where:  09/20/21 from [facility]  Attempts:  3rd Attempt  Reason for unsuccessful TCM follow-up call:  No answer/busy

## 2021-10-10 ENCOUNTER — Other Ambulatory Visit: Payer: Self-pay | Admitting: Neurology

## 2021-10-10 DIAGNOSIS — G8929 Other chronic pain: Secondary | ICD-10-CM

## 2021-10-10 DIAGNOSIS — M5137 Other intervertebral disc degeneration, lumbosacral region: Secondary | ICD-10-CM

## 2021-10-10 DIAGNOSIS — M5116 Intervertebral disc disorders with radiculopathy, lumbar region: Secondary | ICD-10-CM

## 2021-10-10 DIAGNOSIS — M5442 Lumbago with sciatica, left side: Secondary | ICD-10-CM

## 2021-10-10 DIAGNOSIS — M5030 Other cervical disc degeneration, unspecified cervical region: Secondary | ICD-10-CM

## 2021-10-10 DIAGNOSIS — M19012 Primary osteoarthritis, left shoulder: Secondary | ICD-10-CM

## 2021-10-10 NOTE — Telephone Encounter (Signed)
Patient called and left vm asking for refill of Hydrocodone. Last written 09/14/2021 #60 no refills. She has been out a couple days, she states they have been changing her mental health medication and she has been "really sick" and can't come in for an appt. The visit she missed here for 3 month follow up she was in the hospital. Okay to send one more month for patient? Last seen in November.  ?

## 2021-10-11 MED ORDER — HYDROCODONE-ACETAMINOPHEN 5-325 MG PO TABS: 1.0000 | ORAL_TABLET | Freq: Two times a day (BID) | ORAL | 0 refills | Status: DC | PRN

## 2021-10-11 MED ORDER — HYDROCODONE-ACETAMINOPHEN 7.5-325 MG PO TABS: 1.0000 | ORAL_TABLET | Freq: Two times a day (BID) | ORAL | 0 refills | Status: DC | PRN

## 2021-10-11 NOTE — Telephone Encounter (Signed)
Received note from pharmacy that 5-325 on long term backorder. Can you send 7.5-325 mg dosage as you did last month?  ?

## 2021-10-11 NOTE — Addendum Note (Signed)
Addended byAnnamaria Helling on: 10/11/2021 04:31 PM ? ? Modules accepted: Orders ? ?

## 2021-10-29 ENCOUNTER — Other Ambulatory Visit (HOSPITAL_COMMUNITY): Payer: Self-pay | Admitting: Psychiatry

## 2021-10-29 DIAGNOSIS — F315 Bipolar disorder, current episode depressed, severe, with psychotic features: Secondary | ICD-10-CM

## 2021-11-09 ENCOUNTER — Telehealth (INDEPENDENT_AMBULATORY_CARE_PROVIDER_SITE_OTHER): Payer: Medicare Other | Admitting: Physician Assistant

## 2021-11-09 ENCOUNTER — Encounter: Payer: Self-pay | Admitting: Physician Assistant

## 2021-11-09 VITALS — Ht 65.0 in | Wt 200.0 lb

## 2021-11-09 DIAGNOSIS — M19012 Primary osteoarthritis, left shoulder: Secondary | ICD-10-CM

## 2021-11-09 DIAGNOSIS — T887XXA Unspecified adverse effect of drug or medicament, initial encounter: Secondary | ICD-10-CM | POA: Insufficient documentation

## 2021-11-09 DIAGNOSIS — M5137 Other intervertebral disc degeneration, lumbosacral region: Secondary | ICD-10-CM

## 2021-11-09 DIAGNOSIS — M5442 Lumbago with sciatica, left side: Secondary | ICD-10-CM | POA: Diagnosis not present

## 2021-11-09 DIAGNOSIS — F315 Bipolar disorder, current episode depressed, severe, with psychotic features: Secondary | ICD-10-CM

## 2021-11-09 DIAGNOSIS — G894 Chronic pain syndrome: Secondary | ICD-10-CM

## 2021-11-09 DIAGNOSIS — G8929 Other chronic pain: Secondary | ICD-10-CM

## 2021-11-09 DIAGNOSIS — M5030 Other cervical disc degeneration, unspecified cervical region: Secondary | ICD-10-CM

## 2021-11-09 DIAGNOSIS — M5441 Lumbago with sciatica, right side: Secondary | ICD-10-CM

## 2021-11-09 DIAGNOSIS — M5116 Intervertebral disc disorders with radiculopathy, lumbar region: Secondary | ICD-10-CM | POA: Diagnosis not present

## 2021-11-09 MED ORDER — HYDROCODONE-ACETAMINOPHEN 10-325 MG PO TABS: 1.0000 | ORAL_TABLET | Freq: Two times a day (BID) | ORAL | 0 refills | Status: DC

## 2021-11-09 MED ORDER — HYDROCODONE-ACETAMINOPHEN 10-325 MG PO TABS: 1.0000 | ORAL_TABLET | Freq: Two times a day (BID) | ORAL | 0 refills | Status: AC

## 2021-11-09 NOTE — Progress Notes (Signed)
..Virtual Visit via Telephone Note ? ?I connected with Amber Rice on 11/10/21 at 10:10 AM EDT by telephone and verified that I am speaking with the correct person using two identifiers. ? ?Location: ?Patient: home ?Provider: clinic ? ?Marland Kitchen.Participating in visit:  ?Patient: Amber Rice ?Provider: Iran Planas PA-C ?Provider in training: Leona Singleton PA-S ?  ?I discussed the limitations, risks, security and privacy concerns of performing an evaluation and management service by telephone and the availability of in person appointments. I also discussed with the patient that there may be a patient responsible charge related to this service. The patient expressed understanding and agreed to proceed. ? ? ?History of Present Illness: ?Pt is a 66 yo female with chronic pain and bipolar 1 who calls into the clinic for norco refills. She has had a rough few months with medication reactions to her bipolar medications. Her pain has not been controlled. She asks for a mg increase. She has OA in multiple joints.  ? ?She is not on any medication for bipolar after multiple medication reactions.  ? ?.. ?Active Ambulatory Problems  ?  Diagnosis Date Noted  ? Hyperlipidemia 07/09/2013  ? GERD (gastroesophageal reflux disease) 07/09/2013  ? DDD (degenerative disc disease), lumbosacral 07/09/2013  ? Bipolar I disorder, current or most recent episode depressed, with psychotic features with mixed features ([place]) 07/09/2013  ? Osteoarthritis of left glenohumeral joint 04/30/2014  ? Status post reverse arthroplasty of shoulder, left 03/01/2015  ? Bilateral carpal tunnel syndrome 05/03/2015  ? DDD (degenerative disc disease), cervical 05/03/2015  ? Lumbar disc herniation with radiculopathy 05/03/2015  ? Chronic SI joint pain 09/21/2015  ? Leukocytosis 01/14/2016  ? Acquired hypothyroidism 01/14/2016  ? Callus 05/08/2016  ? Tobacco abuse 10/12/2016  ? Colon polyp 01/04/2018  ? BMI 35.0-35.9,adult 12/19/2018  ? Myalgia 06/23/2019  ? Chronic cough  07/23/2019  ? Arthralgia 07/23/2019  ? Glossitis 08/14/2019  ? Primary osteoarthritis of right ankle 08/21/2019  ? Bilateral lower extremity edema 10/22/2019  ? Elevated blood pressure reading 10/24/2019  ? Proteinuria 10/24/2019  ? Chronic bilateral low back pain with bilateral sciatica 11/11/2019  ? Weakness 12/22/2019  ? Fatigue 12/22/2019  ? Chronic venous stasis 01/19/2020  ? RLS (restless legs syndrome) 04/16/2020  ? Chronic pain syndrome 05/05/2020  ? Right upper quadrant guarding 10/18/2020  ? Hepatic steatosis 10/18/2020  ? Abdominal distension (gaseous) 10/18/2020  ? Seasonal allergic rhinitis due to pollen 11/29/2020  ? Fibromyalgia 03/07/2021  ? Dupuytren's contracture of left hand 03/07/2021  ? Leg edema, right 03/07/2021  ? Trigger finger, left middle finger 03/17/2021  ? Medication side effect 11/09/2021  ? ?Resolved Ambulatory Problems  ?  Diagnosis Date Noted  ? Low back pain with sciatica 07/09/2013  ? Impacted cerumen of left ear 04/23/2017  ? ?Past Medical History:  ?Diagnosis Date  ? Degenerative disc disease, lumbar   ? ? ? ?  ?Observations/Objective: ?No acute distress ? ? ?Assessment and Plan: ?..Carleigh was seen today for pain management. ? ?Diagnoses and all orders for this visit: ? ?Chronic pain syndrome ?-     HYDROcodone-acetaminophen (NORCO) 10-325 MG tablet; Take 1 tablet by mouth every 12 (twelve) hours. ?-     HYDROcodone-acetaminophen (NORCO) 10-325 MG tablet; Take 1 tablet by mouth every 12 (twelve) hours. ?-     HYDROcodone-acetaminophen (NORCO) 10-325 MG tablet; Take 1 tablet by mouth every 12 (twelve) hours. ? ?DDD (degenerative disc disease), lumbosacral ?-     HYDROcodone-acetaminophen (NORCO) 10-325 MG tablet; Take 1 tablet by  mouth every 12 (twelve) hours. ?-     HYDROcodone-acetaminophen (NORCO) 10-325 MG tablet; Take 1 tablet by mouth every 12 (twelve) hours. ?-     HYDROcodone-acetaminophen (NORCO) 10-325 MG tablet; Take 1 tablet by mouth every 12 (twelve)  hours. ? ?Chronic bilateral low back pain with bilateral sciatica ?-     HYDROcodone-acetaminophen (NORCO) 10-325 MG tablet; Take 1 tablet by mouth every 12 (twelve) hours. ?-     HYDROcodone-acetaminophen (NORCO) 10-325 MG tablet; Take 1 tablet by mouth every 12 (twelve) hours. ?-     HYDROcodone-acetaminophen (NORCO) 10-325 MG tablet; Take 1 tablet by mouth every 12 (twelve) hours. ? ?Lumbar disc herniation with radiculopathy ?-     HYDROcodone-acetaminophen (NORCO) 10-325 MG tablet; Take 1 tablet by mouth every 12 (twelve) hours. ?-     HYDROcodone-acetaminophen (NORCO) 10-325 MG tablet; Take 1 tablet by mouth every 12 (twelve) hours. ?-     HYDROcodone-acetaminophen (NORCO) 10-325 MG tablet; Take 1 tablet by mouth every 12 (twelve) hours. ? ?DDD (degenerative disc disease), cervical ?-     HYDROcodone-acetaminophen (NORCO) 10-325 MG tablet; Take 1 tablet by mouth every 12 (twelve) hours. ?-     HYDROcodone-acetaminophen (NORCO) 10-325 MG tablet; Take 1 tablet by mouth every 12 (twelve) hours. ?-     HYDROcodone-acetaminophen (NORCO) 10-325 MG tablet; Take 1 tablet by mouth every 12 (twelve) hours. ? ?Osteoarthritis of left glenohumeral joint ?-     HYDROcodone-acetaminophen (NORCO) 10-325 MG tablet; Take 1 tablet by mouth every 12 (twelve) hours. ?-     HYDROcodone-acetaminophen (NORCO) 10-325 MG tablet; Take 1 tablet by mouth every 12 (twelve) hours. ?-     HYDROcodone-acetaminophen (NORCO) 10-325 MG tablet; Take 1 tablet by mouth every 12 (twelve) hours. ? ?Bipolar I disorder, current or most recent episode depressed, with psychotic features with mixed features ([place]) ? ?Medication side effect ? ? ?..PDMP reviewed during this encounter. ?No concerns ?Pain contract UTD ?UDS UTD ?Increased norco to '10mg'$  ?Follow up in 3 months  ? ? ?Follow Up Instructions: ? ?  ?I discussed the assessment and treatment plan with the patient. The patient was provided an opportunity to ask questions and all were answered. The  patient agreed with the plan and demonstrated an understanding of the instructions. ?  ?The patient was advised to call back or seek an in-person evaluation if the symptoms worsen or if the condition fails to improve as anticipated. ? ?I provided 20 minutes of non-face-to-face time during this encounter. ? ? ?Iran Planas, PA-C ? ?

## 2021-11-09 NOTE — Progress Notes (Signed)
Needs pain medication refills ?Has been really sick from mood medication side effects, went off all other medication. Has lost a lot of weight since being sick/stopping meds  ?Current weight per patient 200 lbs, previous weight 248 lbs in office ?

## 2021-12-08 ENCOUNTER — Telehealth: Payer: Self-pay

## 2021-12-08 NOTE — Telephone Encounter (Signed)
Pt called an left a voicemail in regards to her medication script, pt sates she is unable to get medications due to refill to early from the pharmacy, called pt back left detailed voicemail  in regards to her being able to pick her medications on 12/09/21. ?

## 2022-02-08 ENCOUNTER — Telehealth (HOSPITAL_COMMUNITY): Payer: Self-pay | Admitting: Psychiatry

## 2022-02-08 NOTE — Telephone Encounter (Signed)
Talked to Ivin Booty, her sister. Discussed emergency options of calling police or commitment if patient is threat to herself or others. Remains non compliant. Sister will talk to patient if she wants to get back on lithium we can do that . Sister did not think I need to call patient as she may get more paranoid of her sister. Sister will observe and let us know if concerns.  Patient has not been seen in office since January and did not make appointment. Sister will ask her about to make appointment and notifiy [place] as well.

## 2022-02-08 NOTE — Telephone Encounter (Signed)
Pts sister calling - Ivin Booty - We do have permission to speak with her.   She is reporting that patient has stopped medication. She is extremely paranoid. She does not know what to do.   CB # [id number]  Next Visit - none schd. - called sister to make an appointment Last Visit - 1/4

## 2022-02-14 ENCOUNTER — Ambulatory Visit: Payer: Medicare Other | Admitting: Physician Assistant

## 2022-02-21 ENCOUNTER — Ambulatory Visit: Payer: Medicare Other | Admitting: Physician Assistant

## 2022-02-28 ENCOUNTER — Ambulatory Visit (HOSPITAL_COMMUNITY): Payer: Medicare Other | Admitting: Psychiatry

## 2022-02-28 ENCOUNTER — Telehealth (HOSPITAL_COMMUNITY): Payer: Self-pay | Admitting: Psychiatry

## 2022-02-28 NOTE — Telephone Encounter (Addendum)
Patient's sister Doroteo Bradford left a voicemail after hours 02/27/2022 requesting a call back to schedule appointment with provider today. On call back sister states patient has been off meds since January and has become increasingly paranoid. Recent move to new apartment and accusing neighbors of being in a cult, yelling at them, resulting in law enforcement being called but they declined to pursue IVC.Refuses to go to the ER but has agreed to see provider in the office. Scheduled today at 1530. Advised sister of law enforcement option for immediate danger to self/others and of IVC process through magistrate.  Patient's sister called back at 1518. States patient is refusing to come to the appointment today. Advised her again of IVC option. Messaged provider to update.

## 2022-03-07 ENCOUNTER — Telehealth: Payer: Self-pay | Admitting: General Practice

## 2022-03-07 NOTE — Telephone Encounter (Signed)
Transition Care Management Unsuccessful Follow-up Telephone Call  Date of discharge and from where:  03/03/22 from [facility]  Attempts:  1st Attempt  Reason for unsuccessful TCM follow-up call:  Left voice message

## 2022-03-08 NOTE — Telephone Encounter (Signed)
Transition Care Management Unsuccessful Follow-up Telephone Call  Date of discharge and from where:  03/03/22 from [facility]  Attempts:  2nd Attempt  Reason for unsuccessful TCM follow-up call:  Left voice message

## 2022-03-09 ENCOUNTER — Ambulatory Visit (HOSPITAL_COMMUNITY): Payer: Medicare Other | Admitting: Psychiatry

## 2022-03-09 ENCOUNTER — Encounter (HOSPITAL_COMMUNITY): Payer: Self-pay

## 2022-03-13 NOTE — Telephone Encounter (Signed)
Transition Care Management Unsuccessful Follow-up Telephone Call  Date of discharge and from where:  03/03/22 from [facility]  Attempts:  3rd Attempt  Reason for unsuccessful TCM follow-up call:  Left voice message

## 2022-03-23 ENCOUNTER — Encounter (HOSPITAL_COMMUNITY): Payer: Self-pay | Admitting: Psychiatry

## 2022-03-23 ENCOUNTER — Ambulatory Visit (INDEPENDENT_AMBULATORY_CARE_PROVIDER_SITE_OTHER): Payer: Medicare Other | Admitting: Psychiatry

## 2022-03-23 VITALS — BP 124/72 | HR 89 | Temp 98.7°F | Ht 64.0 in | Wt 181.0 lb

## 2022-03-23 DIAGNOSIS — F315 Bipolar disorder, current episode depressed, severe, with psychotic features: Secondary | ICD-10-CM | POA: Diagnosis not present

## 2022-03-23 DIAGNOSIS — F5102 Adjustment insomnia: Secondary | ICD-10-CM

## 2022-03-23 MED ORDER — DIVALPROEX SODIUM 500 MG PO DR TAB: 500.0000 mg | DELAYED_RELEASE_TABLET | Freq: Two times a day (BID) | ORAL | 0 refills | Status: DC

## 2022-03-23 NOTE — Progress Notes (Signed)
Patient ID: Amber Rice, female   DOB: 1956-06-17, 66 y.o.   MRN: [id number] [place] [id number] 66 y.o.  03/23/2022      Chief Complaint: Follow up bipolar disorder. Med review, hospital discharge     History of Present Illness:   Diagnosed with bipolar disorder depressed. Anxiety disorder NOS. Insomnia  Has been a NO show and last seen 8 months ago. Has had connected with sister due to non compliance and finally had IVC in July, now on depakote Reviewed discharge summary from hospital discharged on depakote, levles reviewed  Admitted due to mania with psychosis and stabilized  She did show up today and feel some balanced, discussed got non compliant with lihtium, then lamictal and felt did not wanted to take trilepta Describes manic symptoms of being hyper, music , singing and got admitted   States does not have depression  On eval today doing fair, feels some balanced, still some hypertalkative but not paranoid or hallucinations Says depakote ER expensive and cannot afford it but has been taking for now   Duration of bipolar :20 plus years  Aggravating factor : past surgeries, non compliance depression scale 7/10 . 10 being no depression  Modifying factors; reading, sister Severity recent admission now feel some balanced  Past Medical History:  Diagnosis Date   Degenerative disc disease, lumbar    diagnosed in 2005   Hyperlipidemia    Impacted cerumen of left ear 04/23/2017   Family History  Problem Relation Age of Onset   Cancer Mother    Heart attack Father    Hyperlipidemia Father    Cancer Maternal Grandmother    Cancer Paternal Grandmother    Bipolar disorder Paternal Uncle        Committed suicide in his 45's   Dementia Paternal Uncle    Depression Cousin    Schizophrenia Cousin    Multiple sclerosis Sister    Breast cancer Cousin    Alcohol abuse Neg Hx    Anxiety disorder Neg Hx      Outpatient Encounter Medications as of 03/23/2022  Medication Sig   divalproex (DEPAKOTE ER) 500 MG 24 hr tablet Take by mouth.   divalproex (DEPAKOTE) 500 MG DR tablet Take 1 tablet (500 mg total) by mouth 2 (two) times daily.   HYDROcodone-acetaminophen (NORCO) 10-325 MG tablet Take 1 tablet by mouth every 12 (twelve) hours. (Patient not taking: Reported on 03/23/2022)   HYDROcodone-acetaminophen (NORCO) 10-325 MG tablet Take 1 tablet by mouth every 12 (twelve) hours. (Patient not taking: Reported on 03/23/2022)   No facility-administered encounter medications on file as of 03/23/2022.    No results found for this or any previous visit (from the past 2160 hour(s)).    BP 124/72 (BP Location: Right Arm, Patient Position: Sitting, Cuff Size: Normal)   Pulse 89   Temp 98.7 F (37.1 C)   Ht '5\' 4"'$  (1.626 m)   Wt 181 lb (82.1 kg)   SpO2 99%   BMI 31.07 kg/m    Review of Systems  Cardiovascular:  Negative for chest pain.  Psychiatric/Behavioral:  Negative for depression, substance abuse and suicidal ideas.     Mental Status Examination  Appearance: casual Alert: Yes Attention: fair  Cooperative: Yes Eye Contact:  Speech: normal Psychomotor Activity: Decreased Memory/Concentration: adequate Oriented: person, place, time/date and situation Mood: fair Affect: congruent Thought Processes and Associations: Coherent no paranoia as of now [employer] of Knowledge: Fair Thought Content: Suicidal ideation and  Homicidal ideation were denied Insight: Fair Judgement: Fair  Diagnosis: Bipolar disorder type I manic per history for psychotic features. Insomnia NOS. Adjustment disorder with anxiety . EPS Treatment Plan:   Prior documentation reviewed     1. Bipolar: depression : manic: stablized in hospital recently, continue depakote says cannot afford will change to deapakote regular bid  Reviewed side effects Other options of zyprexa discussed if depakote is not affordable Call  earlier for concerns 2. Depression: denies depression, not on zoloft, it was stopped in hospital, also was not taking  3. Insomnia: reviewed sleep hygine  Discussed compliance Patient remains high risk considering past history of non compliance  history . Sister is supportive discussed to keep connection with support and compliance  Direct care time spent in office 25 min including face to face chart review and documentation  Fu 3weeks in office,  [name], MD

## 2022-03-27 ENCOUNTER — Telehealth (HOSPITAL_COMMUNITY): Payer: Self-pay | Admitting: Pediatrics

## 2022-03-27 ENCOUNTER — Telehealth (HOSPITAL_COMMUNITY): Payer: Self-pay | Admitting: Psychiatry

## 2022-03-27 NOTE — Telephone Encounter (Signed)
Patient called stating provider had instructed her to call if she had any questions/concerns about her medications. Requesting provider  call her at [phone].

## 2022-03-29 ENCOUNTER — Encounter: Payer: Self-pay | Admitting: General Practice

## 2022-03-29 NOTE — Telephone Encounter (Signed)
Error

## 2022-04-03 ENCOUNTER — Telehealth (HOSPITAL_COMMUNITY): Payer: Self-pay

## 2022-04-03 ENCOUNTER — Other Ambulatory Visit (HOSPITAL_COMMUNITY): Payer: Self-pay | Admitting: Psychiatry

## 2022-04-03 NOTE — Telephone Encounter (Signed)
Patient says she mistakenly threw out the Depakote 500 DR tablets. She wants to know if you can call it in again and say it is ok for her to pick it up at the [facility] [address] in [place] Last refill 08/17 Next appt 09/07

## 2022-04-04 ENCOUNTER — Telehealth (HOSPITAL_COMMUNITY): Payer: Self-pay

## 2022-04-04 MED ORDER — DIVALPROEX SODIUM 500 MG PO DR TAB: 500.0000 mg | DELAYED_RELEASE_TABLET | Freq: Two times a day (BID) | ORAL | 0 refills | Status: DC

## 2022-04-04 NOTE — Telephone Encounter (Signed)
Informed patient. She says she will speak with you about it at appt next week

## 2022-04-04 NOTE — Telephone Encounter (Signed)
Patient wants to know if you can restart her Lithium? Please advise  [facility] [address] [facility] [place] Next appt 09/07

## 2022-04-13 ENCOUNTER — Encounter (HOSPITAL_COMMUNITY): Payer: Self-pay | Admitting: Psychiatry

## 2022-04-13 ENCOUNTER — Ambulatory Visit (INDEPENDENT_AMBULATORY_CARE_PROVIDER_SITE_OTHER): Payer: Medicare Other | Admitting: Psychiatry

## 2022-04-13 VITALS — BP 150/68 | Ht 64.0 in | Wt 173.0 lb

## 2022-04-13 DIAGNOSIS — F315 Bipolar disorder, current episode depressed, severe, with psychotic features: Secondary | ICD-10-CM | POA: Diagnosis not present

## 2022-04-13 DIAGNOSIS — F5102 Adjustment insomnia: Secondary | ICD-10-CM | POA: Diagnosis not present

## 2022-04-13 MED ORDER — OLANZAPINE 10 MG PO TABS: 10.0000 mg | ORAL_TABLET | Freq: Every day | ORAL | 0 refills | Status: DC

## 2022-04-13 NOTE — Progress Notes (Signed)
Patient ID: Amber Rice, female   DOB: Nov 17, 1955, 66 y.o.   MRN: [id number] [place] [id number] 66 y.o.  04/13/2022      Chief Complaint: Follow up bipolar disorder. Med review,     History of Present Illness:   Diagnosed with bipolar disorder depressed. Anxiety disorder NOS. Insomnia  Has been a NO show and last seen 8 months ago. Has had connected with sister due to non compliance and finally had IVC in July, now on depakote After last hospital discharge did not continue depaktoe . Says stopped 2 weeks ago. Wants to consider other options or lithium, she was having thyroid concerns with lihtium  Feel somewhat elated at times mind racing, denies paranoia Does not want  to take depakote Overall appeared calm and not responding to any external or internal stimuli   Duration of bipolar :20 plus years  Aggravating factor : past surgeries, non compliance depression scale 7/10 . 10 being no depression  Modifying factors;reading Severity recent admission , still not compliant  Past Medical History:  Diagnosis Date   Degenerative disc disease, lumbar    diagnosed in 2005   Hyperlipidemia    Impacted cerumen of left ear 04/23/2017   Family History  Problem Relation Age of Onset   Cancer Mother    Heart attack Father    Hyperlipidemia Father    Cancer Maternal Grandmother    Cancer Paternal Grandmother    Bipolar disorder Paternal Uncle        Committed suicide in his 91's   Dementia Paternal Uncle    Depression Cousin    Schizophrenia Cousin    Multiple sclerosis Sister    Breast cancer Cousin    Alcohol abuse Neg Hx    Anxiety disorder Neg Hx     Outpatient Encounter Medications as of 04/13/2022  Medication Sig   divalproex (DEPAKOTE) 500 MG DR tablet Take 1 tablet (500 mg total) by mouth 2 (two) times daily.   furosemide (LASIX) 20 MG tablet    ibuprofen (ADVIL) 200 MG tablet Take by mouth.   nicotine  (NICODERM CQ - DOSED IN MG/24 HOURS) 21 mg/24hr patch Place onto the skin.   OLANZapine (ZYPREXA) 10 MG tablet Take 1 tablet (10 mg total) by mouth at bedtime.   rOPINIRole (REQUIP) 0.25 MG tablet Take by mouth.   divalproex (DEPAKOTE ER) 500 MG 24 hr tablet Take by mouth.   HYDROcodone-acetaminophen (NORCO) 10-325 MG tablet Take 1 tablet by mouth every 12 (twelve) hours. (Patient not taking: Reported on 03/23/2022)   HYDROcodone-acetaminophen (NORCO) 10-325 MG tablet Take 1 tablet by mouth every 12 (twelve) hours. (Patient not taking: Reported on 03/23/2022)   No facility-administered encounter medications on file as of 04/13/2022.    No results found for this or any previous visit (from the past 2160 hour(s)).    BP (!) 150/68 (BP Location: Left Arm, Patient Position: Sitting, Cuff Size: Normal)   Ht '5\' 4"'$  (1.626 m)   Wt 173 lb (78.5 kg)   BMI 29.70 kg/m    Review of Systems  Cardiovascular:  Negative for chest pain.  Neurological:  Negative for tremors.  Psychiatric/Behavioral:  Negative for depression, substance abuse and suicidal ideas.     Mental Status Examination  Appearance: casual Alert: Yes Attention: fair  Cooperative: Yes Eye Contact:  Speech: normal Psychomotor Activity: Decreased Memory/Concentration: adequate Oriented: person, place, time/date and situation Mood: fair Affect: congruent Thought Processes and Associations: Coherent no paranoia  as of now [employer] of Knowledge: Fair Thought Content: Suicidal ideation and Homicidal ideation were denied Insight: Fair Judgement: Fair  Diagnosis: Bipolar disorder type I manic per history for psychotic features. Insomnia NOS. Adjustment disorder with anxiety . EPS Treatment Plan:   Prior documentation reviewed     1. Bipolar: depression : manic: feels mind racy, irregular sleep or elated thoughts. Agrees to zyprexa will start '5mg'$  increase to 10 in 3 days It would help sleep as well  Not taking depakote  2.  Depression: gets down at times but not hopeless, will start zyprexa   3. Insomnia: irregular sleep, reviewed sleep hgyiene start zyprexa No tremors  Side effects discussed    Direct care time spent in office 20 plus min including face to face, chart review and documentation   Fu 3weeks in office,  [name], MD

## 2022-04-14 ENCOUNTER — Telehealth (HOSPITAL_COMMUNITY): Payer: Self-pay

## 2022-04-14 NOTE — Telephone Encounter (Signed)
Since pt does not have a phone currently she asked for me to email her instead. I emailed her what Dr. [name] stated in previous message

## 2022-04-14 NOTE — Telephone Encounter (Signed)
Patient states that Zyprexa cost is too high. She would like a substitute sent [facility] to [facility] [address] in [place]

## 2022-04-27 ENCOUNTER — Telehealth (HOSPITAL_COMMUNITY): Payer: Self-pay

## 2022-04-27 NOTE — Telephone Encounter (Signed)
Medication refill - [facility] requesting an Olanzapine 10 mg 90 day order.  This was just started on 04/12/22 for #30 and patient returns 05/23/22.

## 2022-05-01 ENCOUNTER — Other Ambulatory Visit (HOSPITAL_COMMUNITY): Payer: Self-pay

## 2022-05-01 MED ORDER — OLANZAPINE 10 MG PO TABS: 10.0000 mg | ORAL_TABLET | Freq: Every day | ORAL | 0 refills | Status: DC

## 2022-05-10 ENCOUNTER — Ambulatory Visit (INDEPENDENT_AMBULATORY_CARE_PROVIDER_SITE_OTHER): Payer: Medicare Other | Admitting: Physician Assistant

## 2022-05-10 ENCOUNTER — Encounter: Payer: Self-pay | Admitting: Physician Assistant

## 2022-05-10 ENCOUNTER — Ambulatory Visit (INDEPENDENT_AMBULATORY_CARE_PROVIDER_SITE_OTHER): Payer: Medicare Other

## 2022-05-10 ENCOUNTER — Telehealth (HOSPITAL_COMMUNITY): Payer: Self-pay | Admitting: Psychiatry

## 2022-05-10 VITALS — BP 143/88 | HR 90 | Ht 64.0 in | Wt 174.0 lb

## 2022-05-10 DIAGNOSIS — M5442 Lumbago with sciatica, left side: Secondary | ICD-10-CM | POA: Diagnosis not present

## 2022-05-10 DIAGNOSIS — R29898 Other symptoms and signs involving the musculoskeletal system: Secondary | ICD-10-CM | POA: Diagnosis not present

## 2022-05-10 DIAGNOSIS — M5116 Intervertebral disc disorders with radiculopathy, lumbar region: Secondary | ICD-10-CM

## 2022-05-10 DIAGNOSIS — M5137 Other intervertebral disc degeneration, lumbosacral region: Secondary | ICD-10-CM

## 2022-05-10 DIAGNOSIS — G8929 Other chronic pain: Secondary | ICD-10-CM

## 2022-05-10 DIAGNOSIS — M5441 Lumbago with sciatica, right side: Secondary | ICD-10-CM

## 2022-05-10 DIAGNOSIS — G894 Chronic pain syndrome: Secondary | ICD-10-CM

## 2022-05-10 DIAGNOSIS — M19012 Primary osteoarthritis, left shoulder: Secondary | ICD-10-CM

## 2022-05-10 DIAGNOSIS — M5030 Other cervical disc degeneration, unspecified cervical region: Secondary | ICD-10-CM

## 2022-05-10 MED ORDER — HYDROCODONE-ACETAMINOPHEN 10-325 MG PO TABS: ORAL_TABLET | ORAL | 0 refills | Status: DC

## 2022-05-10 MED ORDER — DIVALPROEX SODIUM 500 MG PO DR TAB: 500.0000 mg | DELAYED_RELEASE_TABLET | Freq: Two times a day (BID) | ORAL | 1 refills | Status: DC

## 2022-05-10 NOTE — Progress Notes (Signed)
   Acute Office Visit  Subjective:     Patient ID: Amber Rice, female    DOB: 10-21-1955, 66 y.o.   MRN: [id number]  Chief Complaint  Patient presents with  . Mass    Epigastric     HPI Patient is in today for ***  6/5 last pain refill.   ROS      Objective:    BP (!) 145/109   Pulse 90   Ht '5\' 4"'$  (1.626 m)   Wt 174 lb (78.9 kg)   SpO2 98%   BMI 29.87 kg/m  {Vitals History (Optional):23777}  Physical Exam  No results found for any visits on 05/10/22.      Assessment & Plan:   Problem List Items Addressed This Visit   None   No orders of the defined types were placed in this encounter.   No follow-ups on file.  Iran Planas, PA-C

## 2022-05-10 NOTE — Telephone Encounter (Signed)
Patient walked in to clinic requesting a medication refill of divalproex (DEPAKOTE) 500 MG DR tablet  [facility]  [id number] [place]  [phone] Fax:  [phone]    Last ordered: 04/04/22 60 tablets Last visit: 04/13/22 Next visit: 05/23/22

## 2022-05-10 NOTE — Telephone Encounter (Signed)
Medication management - Telephone message left for patient that Dr. [name] had sent in her requested new [facility]ivalproex [facility] [address] [place].

## 2022-05-11 ENCOUNTER — Encounter: Payer: Self-pay | Admitting: Physician Assistant

## 2022-05-11 DIAGNOSIS — R29898 Other symptoms and signs involving the musculoskeletal system: Secondary | ICD-10-CM | POA: Insufficient documentation

## 2022-05-15 NOTE — Progress Notes (Signed)
Normal xray of sternum.

## 2022-05-16 ENCOUNTER — Other Ambulatory Visit (HOSPITAL_COMMUNITY): Payer: Self-pay | Admitting: Psychiatry

## 2022-05-18 ENCOUNTER — Telehealth (HOSPITAL_COMMUNITY): Payer: Self-pay

## 2022-05-18 MED ORDER — OLANZAPINE 10 MG PO TABS: 10.0000 mg | ORAL_TABLET | Freq: Every day | ORAL | 0 refills | Status: DC

## 2022-05-18 NOTE — Telephone Encounter (Signed)
Left vm informing patient

## 2022-05-18 NOTE — Telephone Encounter (Signed)
Patient says she would like to switch to Zyprexa. She says she has the money to afford it now. [facility] [address] [facility] [place] Please advise Next ov 10/17

## 2022-05-18 NOTE — Telephone Encounter (Signed)
10mg

## 2022-05-22 ENCOUNTER — Other Ambulatory Visit (HOSPITAL_COMMUNITY): Payer: Self-pay

## 2022-05-22 MED ORDER — DIVALPROEX SODIUM 500 MG PO DR TAB: 500.0000 mg | DELAYED_RELEASE_TABLET | Freq: Two times a day (BID) | ORAL | 1 refills | Status: DC

## 2022-05-23 ENCOUNTER — Other Ambulatory Visit (HOSPITAL_BASED_OUTPATIENT_CLINIC_OR_DEPARTMENT_OTHER): Payer: Self-pay | Admitting: Physician Assistant

## 2022-05-23 ENCOUNTER — Ambulatory Visit (HOSPITAL_COMMUNITY): Payer: Medicare Other | Admitting: Psychiatry

## 2022-05-23 DIAGNOSIS — Z1231 Encounter for screening mammogram for malignant neoplasm of breast: Secondary | ICD-10-CM

## 2022-05-25 ENCOUNTER — Ambulatory Visit (INDEPENDENT_AMBULATORY_CARE_PROVIDER_SITE_OTHER): Payer: Medicare Other | Admitting: Psychiatry

## 2022-05-25 ENCOUNTER — Encounter (HOSPITAL_COMMUNITY): Payer: Self-pay | Admitting: Psychiatry

## 2022-05-25 ENCOUNTER — Ambulatory Visit (INDEPENDENT_AMBULATORY_CARE_PROVIDER_SITE_OTHER): Payer: Medicare Other

## 2022-05-25 VITALS — BP 148/74 | Temp 98.4°F | Ht 64.0 in | Wt 171.0 lb

## 2022-05-25 DIAGNOSIS — F5102 Adjustment insomnia: Secondary | ICD-10-CM

## 2022-05-25 DIAGNOSIS — Z1231 Encounter for screening mammogram for malignant neoplasm of breast: Secondary | ICD-10-CM | POA: Diagnosis not present

## 2022-05-25 DIAGNOSIS — F315 Bipolar disorder, current episode depressed, severe, with psychotic features: Secondary | ICD-10-CM

## 2022-05-25 MED ORDER — OLANZAPINE 20 MG PO TABS: 20.0000 mg | ORAL_TABLET | Freq: Every day | ORAL | 0 refills | Status: DC

## 2022-05-25 NOTE — Progress Notes (Signed)
Patient ID: Amber Rice, female   DOB: 1956/06/24, 66 y.o.   MRN: [id number] [name] [id number] 66 y.o.  05/25/2022      Chief Complaint: Follow up bipolar disorder. Med review,     History of Present Illness:   Diagnosed with bipolar disorder depressed. Anxiety disorder NOS. Insomnia  Has been non compliant and called she threw away depakote, then wanted to start zyprexa now at '10mg'$   Still feel paranoid, AH. Non commanding but feeling guarded and feel pills are fake, mind racing and feels delusional Not suicidal, sister is not talking to her  Overall not agitated today but worriful of her condition and paranoia   Duration of bipolar :20 plus years  Aggravating factor : past surgeries, non compliance   Modifying factors;reading when she can read Severity recent admission , still not compliant  Past Medical History:  Diagnosis Date   Degenerative disc disease, lumbar    diagnosed in 2005   Hyperlipidemia    Impacted cerumen of left ear 04/23/2017   Family History  Problem Relation Age of Onset   Cancer Mother    Heart attack Father    Hyperlipidemia Father    Cancer Maternal Grandmother    Cancer Paternal Grandmother    Bipolar disorder Paternal Uncle        Committed suicide in his 65's   Dementia Paternal Uncle    Depression Cousin    Schizophrenia Cousin    Multiple sclerosis Sister    Breast cancer Cousin    Alcohol abuse Neg Hx    Anxiety disorder Neg Hx     Outpatient Encounter Medications as of 05/25/2022  Medication Sig   OLANZapine (ZYPREXA) 20 MG tablet Take 1 tablet (20 mg total) by mouth at bedtime.   divalproex (DEPAKOTE) 500 MG DR tablet Take 1 tablet (500 mg total) by mouth 2 (two) times daily.   furosemide (LASIX) 20 MG tablet    HYDROcodone-acetaminophen (NORCO) 10-325 MG tablet Take one tablet up to twice a day as needed for pain.   ibuprofen (ADVIL) 200 MG tablet Take by mouth.    OLANZapine (ZYPREXA) 10 MG tablet Take 1 tablet (10 mg total) by mouth at bedtime.   rOPINIRole (REQUIP) 0.25 MG tablet Take by mouth.   No facility-administered encounter medications on file as of 05/25/2022.    No results found for this or any previous visit (from the past 2160 hour(s)).    BP (!) 148/74 (BP Location: Left Arm, Patient Position: Sitting, Cuff Size: Normal) Comment: smoked before coming in for appt  Temp 98.4 F (36.9 C)   Ht '5\' 4"'$  (1.626 m)   Wt 171 lb (77.6 kg)   BMI 29.35 kg/m    Review of Systems  Cardiovascular:  Negative for chest pain.  Neurological:  Negative for tremors.  Psychiatric/Behavioral:  Positive for depression. Negative for substance abuse and suicidal ideas. The patient has insomnia.     Mental Status Examination  Appearance: casual Alert: Yes Attention: fair  Cooperative: Yes Eye Contact:  Speech: normal Psychomotor Activity: Decreased Memory/Concentration: adequate Oriented: person, place, time/date and situation Mood: anxious, paranoid Affect: congruent Thought Processes and Associations: Coherent, paranoid and AH non commanding Fund of Knowledge: Fair Thought Content: Suicidal ideation and Homicidal ideation were denied Insight: Fair Judgement: Fair  Diagnosis: Bipolar disorder type I manic per history for psychotic features. Insomnia NOS. Adjustment disorder with anxiety . EPS Treatment Plan:   Prior documentation reviewed  1. Bipolar: depression : manic: paranoid, increase zyprexa to '20mg'$  qhs, agrees to restart depakot '500mg'$  bid was helpful at hospital when she was discharged but then stopped taking Reviewed meds and compliance disucssed,  Discussed risk of unstability if not taking meds    2. Depression: feels down, increase zyprexa and add depakote compliance discussed   3. Insomnia:zyprexa helps, discussed sleep hygiene    Direct care time spent in office  20 min including chart review and  documentation  Fu 3weeks in office,  [name], MD

## 2022-05-29 NOTE — Progress Notes (Signed)
Normal mammogram. Follow up in one year.

## 2022-05-30 ENCOUNTER — Telehealth: Payer: Self-pay | Admitting: Neurology

## 2022-05-30 NOTE — Telephone Encounter (Signed)
[facility] left [facility]. [facility] and [facility], but it is on backorder and they are not able to get it. I called patient back and let her know (LVM) that she needs to call different pharmacies and found out where she can get this prescription and let us know where to send, to call with any problems.

## 2022-06-07 ENCOUNTER — Other Ambulatory Visit (HOSPITAL_COMMUNITY): Payer: Self-pay

## 2022-06-07 ENCOUNTER — Other Ambulatory Visit: Payer: Self-pay | Admitting: Physician Assistant

## 2022-06-07 ENCOUNTER — Other Ambulatory Visit: Payer: Self-pay | Admitting: Neurology

## 2022-06-07 DIAGNOSIS — M5137 Other intervertebral disc degeneration, lumbosacral region: Secondary | ICD-10-CM

## 2022-06-07 DIAGNOSIS — M5030 Other cervical disc degeneration, unspecified cervical region: Secondary | ICD-10-CM

## 2022-06-07 DIAGNOSIS — G8929 Other chronic pain: Secondary | ICD-10-CM

## 2022-06-07 DIAGNOSIS — M19012 Primary osteoarthritis, left shoulder: Secondary | ICD-10-CM

## 2022-06-07 DIAGNOSIS — M5116 Intervertebral disc disorders with radiculopathy, lumbar region: Secondary | ICD-10-CM

## 2022-06-07 DIAGNOSIS — G894 Chronic pain syndrome: Secondary | ICD-10-CM

## 2022-06-07 MED ORDER — HYDROCODONE-ACETAMINOPHEN 10-325 MG PO TABS
ORAL_TABLET | ORAL | 0 refills | Status: DC
  Filled 2022-06-08: qty 60, 30d supply, fill #0

## 2022-06-07 MED ORDER — HYDROCODONE-ACETAMINOPHEN 7.5-325 MG PO TABS: 1.0000 | ORAL_TABLET | Freq: Every day | ORAL | 0 refills | Status: DC | PRN

## 2022-06-07 MED ORDER — ROPINIROLE HCL 0.25 MG PO TABS: 0.2500 mg | ORAL_TABLET | Freq: Every day | ORAL | 1 refills | Status: DC

## 2022-06-07 NOTE — Telephone Encounter (Signed)
Patient made aware.

## 2022-06-07 NOTE — Telephone Encounter (Signed)
Last fill 05/10/2022 last visit 05/10/2022

## 2022-06-07 NOTE — Telephone Encounter (Signed)
Patient called and LVM asking for RX of Hydrocodone 7.5 mg (her old dosage) because the pharmacy does not have the 10 mg in stock. Pended. Please advise.   She is also asking for Requip refill. RX sent.

## 2022-06-08 ENCOUNTER — Encounter (HOSPITAL_COMMUNITY): Payer: Self-pay

## 2022-06-08 ENCOUNTER — Ambulatory Visit (HOSPITAL_COMMUNITY): Payer: Medicare Other | Admitting: Psychiatry

## 2022-06-08 ENCOUNTER — Other Ambulatory Visit (HOSPITAL_COMMUNITY): Payer: Self-pay

## 2022-06-13 ENCOUNTER — Encounter (HOSPITAL_COMMUNITY): Payer: Self-pay

## 2022-06-13 ENCOUNTER — Other Ambulatory Visit (HOSPITAL_COMMUNITY): Payer: Self-pay

## 2022-06-23 ENCOUNTER — Other Ambulatory Visit (HOSPITAL_COMMUNITY): Payer: Self-pay

## 2022-06-27 ENCOUNTER — Ambulatory Visit (HOSPITAL_COMMUNITY): Payer: Medicare Other | Admitting: Psychiatry

## 2022-07-03 ENCOUNTER — Other Ambulatory Visit (HOSPITAL_COMMUNITY): Payer: Self-pay

## 2022-07-03 MED ORDER — DIVALPROEX SODIUM ER 500 MG PO TB24: 500.0000 mg | ORAL_TABLET | Freq: Two times a day (BID) | ORAL | 0 refills | Status: DC

## 2022-07-03 MED ORDER — OLANZAPINE 10 MG PO TABS: 10.0000 mg | ORAL_TABLET | Freq: Every day | ORAL | 0 refills | Status: DC

## 2022-07-03 MED ORDER — DIVALPROEX SODIUM 500 MG PO DR TAB: 500.0000 mg | DELAYED_RELEASE_TABLET | Freq: Two times a day (BID) | ORAL | 1 refills | Status: DC

## 2022-07-10 ENCOUNTER — Telehealth (HOSPITAL_COMMUNITY): Payer: Self-pay | Admitting: *Deleted

## 2022-07-10 MED ORDER — DIVALPROEX SODIUM ER 500 MG PO TB24: 500.0000 mg | ORAL_TABLET | Freq: Two times a day (BID) | ORAL | 0 refills | Status: DC

## 2022-07-10 NOTE — Addendum Note (Signed)
Addended by: [name] on: 07/10/2022 02:15 PM   Modules accepted: Orders

## 2022-07-10 NOTE — Telephone Encounter (Signed)
Rx REFILL REQUEST 90 DAY  [id number] [place], [place] - [address]   divalproex (DEPAKOTE ER) 500 MG 24 hr tablet   LAST APPT: 06/27/22 NEXT APPT: 07/13/22

## 2022-07-11 ENCOUNTER — Ambulatory Visit (INDEPENDENT_AMBULATORY_CARE_PROVIDER_SITE_OTHER): Payer: Medicare Other

## 2022-07-11 ENCOUNTER — Ambulatory Visit (INDEPENDENT_AMBULATORY_CARE_PROVIDER_SITE_OTHER): Payer: Medicare Other | Admitting: Sports Medicine

## 2022-07-11 DIAGNOSIS — G8929 Other chronic pain: Secondary | ICD-10-CM

## 2022-07-11 DIAGNOSIS — M51379 Other intervertebral disc degeneration, lumbosacral region without mention of lumbar back pain or lower extremity pain: Secondary | ICD-10-CM

## 2022-07-11 DIAGNOSIS — M19071 Primary osteoarthritis, right ankle and foot: Secondary | ICD-10-CM

## 2022-07-11 DIAGNOSIS — M5137 Other intervertebral disc degeneration, lumbosacral region: Secondary | ICD-10-CM | POA: Diagnosis not present

## 2022-07-11 DIAGNOSIS — M533 Sacrococcygeal disorders, not elsewhere classified: Secondary | ICD-10-CM | POA: Diagnosis not present

## 2022-07-11 DIAGNOSIS — M545 Low back pain, unspecified: Secondary | ICD-10-CM | POA: Diagnosis not present

## 2022-07-11 MED ORDER — TRIAMCINOLONE ACETONIDE 40 MG/ML IJ SUSP
40.0000 mg | Freq: Once | INTRAMUSCULAR | Status: AC
  Administered 2022-07-11: 40 mg via INTRAMUSCULAR

## 2022-07-11 MED ORDER — PREDNISONE 50 MG PO TABS: ORAL_TABLET | ORAL | 0 refills | Status: DC

## 2022-07-11 MED ORDER — GABAPENTIN 300 MG PO CAPS: ORAL_CAPSULE | ORAL | 3 refills | Status: DC

## 2022-07-11 NOTE — Progress Notes (Signed)
    Procedures performed today:    Procedure: Real-time Ultrasound Guided injection of the right ankle joint Device: Samsung HS60  Verbal informed consent obtained.  Time-out conducted.  Noted no overlying erythema, induration, or other signs of local infection.  Skin prepped in a sterile fashion.  Local anesthesia: Topical Ethyl chloride.  With sterile technique and under real time ultrasound guidance: Noted arthritic changes, 1 cc Kenalog 40, 2 cc lidocaine, 2 cc bupivacaine injected easily Completed without difficulty  Advised to call if fevers/chills, erythema, induration, drainage, or persistent bleeding.  Images permanently stored and available for review in PACS.  Impression: Technically successful ultrasound guided injection.  Independent interpretation of notes and tests performed by another provider:   None.  Brief History, Exam, Impression, and Recommendations:    Primary osteoarthritis of right ankle Pleasant 66 year old female, tibiotalar osteoarthritis, last injection was approximately 2 years ago, repeat tibiotalar joint injection today, return to see me as needed for this.  DDD (degenerative disc disease), lumbosacral Amber Rice is having numbness going down her right leg to most of her toes, particularly the third toe. Suspect more of an L5 radiculitis. Adding 5 days of prednisone, x-rays, Neurontin, home conditioning, return to see me in 6 weeks for this, MRI if no better.    ____________________________________________ Gwen Her. Dianah Field, M.D., ABFM., CAQSM., AME. Primary Care and Sports Medicine [facility]  Adjunct Professor of [employer] [facility] [employer]  Risk manager

## 2022-07-11 NOTE — Assessment & Plan Note (Signed)
Pleasant 66 year old female, tibiotalar osteoarthritis, last injection was approximately 2 years ago, repeat tibiotalar joint injection today, return to see me as needed for this.

## 2022-07-11 NOTE — Assessment & Plan Note (Signed)
Amber Rice is having numbness going down her right leg to most of her toes, particularly the third toe. Suspect more of an L5 radiculitis. Adding 5 days of prednisone, x-rays, Neurontin, home conditioning, return to see me in 6 weeks for this, MRI if no better.

## 2022-07-13 ENCOUNTER — Telehealth (HOSPITAL_COMMUNITY): Payer: Medicare Other | Admitting: Psychiatry

## 2022-07-13 ENCOUNTER — Encounter (HOSPITAL_COMMUNITY): Payer: Self-pay

## 2022-07-24 ENCOUNTER — Telehealth (INDEPENDENT_AMBULATORY_CARE_PROVIDER_SITE_OTHER): Payer: Medicare Other | Admitting: Psychiatry

## 2022-07-24 ENCOUNTER — Encounter (HOSPITAL_COMMUNITY): Payer: Self-pay | Admitting: Psychiatry

## 2022-07-24 DIAGNOSIS — F315 Bipolar disorder, current episode depressed, severe, with psychotic features: Secondary | ICD-10-CM

## 2022-07-24 DIAGNOSIS — F5102 Adjustment insomnia: Secondary | ICD-10-CM | POA: Diagnosis not present

## 2022-07-24 MED ORDER — OLANZAPINE 10 MG PO TABS: 10.0000 mg | ORAL_TABLET | Freq: Every day | ORAL | 1 refills | Status: DC

## 2022-07-24 NOTE — Progress Notes (Signed)
Patient ID: Amber Rice, female   DOB: 19-Sep-1955, 66 y.o.   MRN: [id number] [place] [id number] 66 y.o.  07/24/2022   Virtual Visit via  audio Note  I connected with Amber Rice on 07/24/22 at 10:00 AM EST by a audio  enabled telemedicine application and verified that I am speaking with the correct person using two identifiers.  Location: Patient: home Provider: home office   I discussed the limitations of evaluation and management by telemedicine and the availability of in person appointments. The patient expressed understanding and agreed to proceed.     I discussed the assessment and treatment plan with the patient. The patient was provided an opportunity to ask questions and all were answered. The patient agreed with the plan and demonstrated an understanding of the instructions.   The patient was advised to call back or seek an in-person evaluation if the symptoms worsen or if the condition fails to improve as anticipated.  I provided 15 plus  minutes of non-face-to-face time during this encounter.     Chief Complaint: Follow up bipolar disorder. Med review,     History of Present Illness:   Diagnosed with bipolar disorder depressed. Anxiety disorder NOS. Insomnia  Has been doing recently better and compliant with meds, mood described better and not depressed Has started gaba from pcp for back pain  Olanzapine is '10mg'$  Taking depakote bid No reported side effects, feels less paranoid and denies AH hallucinations    Not agitated Gaba may have helped mood as well.   Duration of bipolar :20 plus years  Aggravating factor : past surgeries, non compliance history  Modifying factors;reading when she can read Severity recent admission , now better  Past Medical History:  Diagnosis Date   Degenerative disc disease, lumbar    diagnosed in 2005   Hyperlipidemia    Impacted cerumen of left ear 04/23/2017    Family History  Problem Relation Age of Onset   Cancer Mother    Heart attack Father    Hyperlipidemia Father    Cancer Maternal Grandmother    Cancer Paternal Grandmother    Bipolar disorder Paternal Uncle        Committed suicide in his 30's   Dementia Paternal Uncle    Depression Cousin    Schizophrenia Cousin    Multiple sclerosis Sister    Breast cancer Cousin    Alcohol abuse Neg Hx    Anxiety disorder Neg Hx     Outpatient Encounter Medications as of 07/24/2022  Medication Sig   divalproex (DEPAKOTE ER) 500 MG 24 hr tablet Take 1 tablet (500 mg total) by mouth 2 (two) times daily.   furosemide (LASIX) 20 MG tablet    gabapentin (NEURONTIN) 300 MG capsule One tab PO qHS for a week, then BID for a week, then TID. May double weekly to a max of 3,'600mg'$ /day   HYDROcodone-acetaminophen (NORCO) 10-325 MG tablet Take one tablet up to twice a day as needed for pain.   HYDROcodone-acetaminophen (NORCO) 7.5-325 MG tablet Take 1-2 tablets by mouth daily as needed for moderate pain (cough).   ibuprofen (ADVIL) 200 MG tablet Take by mouth.   OLANZapine (ZYPREXA) 10 MG tablet Take 1 tablet (10 mg total) by mouth at bedtime.   predniSONE (DELTASONE) 50 MG tablet One tab PO daily for 5 days.   rOPINIRole (REQUIP) 0.25 MG tablet Take 1 tablet (0.25 mg total) by mouth at bedtime.   [DISCONTINUED] OLANZapine (ZYPREXA)  10 MG tablet Take 1 tablet (10 mg total) by mouth at bedtime.   [DISCONTINUED] OLANZapine (ZYPREXA) 20 MG tablet Take 1 tablet (20 mg total) by mouth at bedtime.   No facility-administered encounter medications on file as of 07/24/2022.    No results found for this or any previous visit (from the past 2160 hour(s)).    There were no vitals taken for this visit.   Review of Systems  Cardiovascular:  Negative for chest pain.  Musculoskeletal:  Positive for back pain.  Neurological:  Negative for tremors.  Psychiatric/Behavioral:  Negative for substance abuse and  suicidal ideas.     Mental Status Examination  Appearance: casual Alert: Yes Attention: fair  Cooperative: Yes Eye Contact:  Speech: normal Psychomotor Activity: Decreased Memory/Concentration: adequate Oriented: person, place, time/date and situation Mood: better Affect: congruent Thought Processes and Associations: Coherent, not paranoid Fund of Knowledge: Fair Thought Content: Suicidal ideation and Homicidal ideation were denied Insight: Fair Judgement: Fair  Diagnosis: Bipolar disorder type I manic per history for psychotic features. Insomnia NOS. Adjustment disorder with anxiety . EPS Treatment Plan:   Prior documentation reviewed  1. Bipolar: depression : manic: doing better not paranoid or hyperverbal continue olanzapine, depakote Reviewed side effects and compliant addressed     2. Depression: manageable conitnue zyprexa and deapkote  3. Insomnia: better on olanzapine, also on requip and gaba  Fu 65m Next to be in office Compliance discussed NMerian Capron MD

## 2022-08-09 ENCOUNTER — Other Ambulatory Visit: Payer: Self-pay | Admitting: Neurology

## 2022-08-09 MED ORDER — HYDROCODONE-ACETAMINOPHEN 7.5-325 MG PO TABS: 1.0000 | ORAL_TABLET | Freq: Every day | ORAL | 0 refills | Status: DC | PRN

## 2022-08-09 NOTE — Telephone Encounter (Signed)
Patient called and requested a refill on pain meds. Last written in November for #60 no refills  Last appt 05/10/2022

## 2022-08-09 NOTE — Telephone Encounter (Signed)
...  PDMP reviewed during this encounter.  No concerns.  UTD appt Refill sent Follow up next month.

## 2022-08-10 ENCOUNTER — Telehealth (HOSPITAL_COMMUNITY): Payer: Self-pay | Admitting: *Deleted

## 2022-08-10 NOTE — Telephone Encounter (Signed)
Opened in Error.

## 2022-08-11 NOTE — Addendum Note (Signed)
Addended byAnnamaria Helling on: 08/11/2022 04:44 PM   Modules accepted: Orders

## 2022-08-11 NOTE — Telephone Encounter (Signed)
[facility]

## 2022-08-11 NOTE — Telephone Encounter (Signed)
Patient called back saying [facility]. Called patient to let her know that Vicodin sent to [facility] on [address]. LVM. She is to call me back if she has [facility].

## 2022-08-14 MED ORDER — HYDROCODONE-ACETAMINOPHEN 7.5-325 MG PO TABS: 1.0000 | ORAL_TABLET | Freq: Every day | ORAL | 0 refills | Status: DC | PRN

## 2022-08-22 ENCOUNTER — Ambulatory Visit: Payer: Medicare Other | Admitting: Sports Medicine

## 2022-08-28 ENCOUNTER — Telehealth: Payer: Self-pay

## 2022-08-28 MED ORDER — ROPINIROLE HCL 0.5 MG PO TABS: 0.5000 mg | ORAL_TABLET | Freq: Every day | ORAL | 1 refills | Status: DC

## 2022-08-28 NOTE — Telephone Encounter (Signed)
Requested increase in requip. Sent .'5mg'$  to take at bedtime.

## 2022-08-29 ENCOUNTER — Ambulatory Visit (INDEPENDENT_AMBULATORY_CARE_PROVIDER_SITE_OTHER): Payer: Medicare Other | Admitting: Psychiatry

## 2022-08-29 ENCOUNTER — Ambulatory Visit: Payer: Medicare Other | Admitting: Sports Medicine

## 2022-08-29 ENCOUNTER — Encounter (HOSPITAL_COMMUNITY): Payer: Self-pay | Admitting: Psychiatry

## 2022-08-29 VITALS — BP 114/70 | HR 74 | Ht 65.0 in | Wt 190.0 lb

## 2022-08-29 DIAGNOSIS — F5102 Adjustment insomnia: Secondary | ICD-10-CM | POA: Diagnosis not present

## 2022-08-29 DIAGNOSIS — G8929 Other chronic pain: Secondary | ICD-10-CM | POA: Diagnosis not present

## 2022-08-29 DIAGNOSIS — F315 Bipolar disorder, current episode depressed, severe, with psychotic features: Secondary | ICD-10-CM

## 2022-08-29 MED ORDER — SERTRALINE HCL 50 MG PO TABS: 50.0000 mg | ORAL_TABLET | Freq: Every day | ORAL | 0 refills | Status: DC

## 2022-08-29 NOTE — Progress Notes (Signed)
Patient ID: Amber Rice, female   DOB: 30-Sep-1955, 67 y.o.   MRN: [id number] [place] [id number] 67 y.o.  08/29/2022      Chief Complaint: Follow up bipolar disorder. Med review,     History of Present Illness:   Diagnosed with bipolar disorder depressed. Anxiety disorder NOS. Insomnia Feels subdued, says not much to do during the day and goes to bed, irregular sleep Stays away from people, gets lonely   Wants to get back on zoloft in past has helped depression Still taking depakote and olanzapine says it helps keep not manic No tremors Suffers from pain that makes her to take meds and it can add to tiredness  Otherwise compliant with meds, not delusional  Not agitated   Duration of bipolar :20 plus years  Aggravating factor : past surgeries, non compliance history  Modifying factors;reading when she can read Severity recent admission , now better  Past Medical History:  Diagnosis Date   Degenerative disc disease, lumbar    diagnosed in 2005   Hyperlipidemia    Impacted cerumen of left ear 04/23/2017   Family History  Problem Relation Age of Onset   Cancer Mother    Heart attack Father    Hyperlipidemia Father    Cancer Maternal Grandmother    Cancer Paternal Grandmother    Bipolar disorder Paternal Uncle        Committed suicide in his 10's   Dementia Paternal Uncle    Depression Cousin    Schizophrenia Cousin    Multiple sclerosis Sister    Breast cancer Cousin    Alcohol abuse Neg Hx    Anxiety disorder Neg Hx     Outpatient Encounter Medications as of 08/29/2022  Medication Sig   divalproex (DEPAKOTE ER) 500 MG 24 hr tablet Take 1 tablet (500 mg total) by mouth 2 (two) times daily.   HYDROcodone-acetaminophen (NORCO) 7.5-325 MG tablet Take 1-2 tablets by mouth daily as needed for moderate pain (cough).   ibuprofen (ADVIL) 200 MG tablet Take by mouth.   OLANZapine (ZYPREXA) 10 MG tablet Take 1  tablet (10 mg total) by mouth at bedtime.   rOPINIRole (REQUIP) 0.5 MG tablet Take 1 tablet (0.5 mg total) by mouth at bedtime.   sertraline (ZOLOFT) 50 MG tablet Take 1 tablet (50 mg total) by mouth daily.   No facility-administered encounter medications on file as of 08/29/2022.    No results found for this or any previous visit (from the past 2160 hour(s)).    BP 114/70   Pulse 74   Ht '5\' 5"'$  (1.651 m)   Wt 190 lb (86.2 kg)   BMI 31.62 kg/m    Review of Systems  Musculoskeletal:  Positive for back pain.  Neurological:  Negative for tremors.  Psychiatric/Behavioral:  Negative for substance abuse and suicidal ideas.     Mental Status Examination  Appearance: casual Alert: Yes Attention: fair  Cooperative: Yes Eye Contact:  Speech: normal Psychomotor Activity: Decreased Memory/Concentration: adequate Oriented: person, place, time/date and situation Mood: fair Affect: congruent Thought Processes and Associations: Coherent, not paranoid Fund of Knowledge: Fair Thought Content: Suicidal ideation and Homicidal ideation were denied Insight: Fair Judgement: Fair  Diagnosis: Bipolar disorder type I manic per history for psychotic features. Insomnia NOS. Adjustment disorder with anxiety . EPS Treatment Plan:   Prior documentation reviewed  1. Bipolar: depression : somewhat subdued, will start small dose of zoloft '50mg'$ , continue olanzapine, no tremors  Also  on deapkote, discussed to avoid naps during the day   2. Depression: subdued, start zoloft  3. Insomnia: manageable, reviewed sleep hygiene, take olanzapine at night and avoid naps during the day  Fu 1 - 67mDirect care time spent in office 20 min including chart review, face to face Compliance discussed NMerian Capron MD

## 2022-08-30 ENCOUNTER — Other Ambulatory Visit (HOSPITAL_COMMUNITY): Payer: Self-pay | Admitting: Psychiatry

## 2022-09-11 ENCOUNTER — Telehealth (HOSPITAL_COMMUNITY): Payer: Self-pay | Admitting: Psychiatry

## 2022-09-11 MED ORDER — SERTRALINE HCL 100 MG PO TABS: 100.0000 mg | ORAL_TABLET | Freq: Every day | ORAL | 0 refills | Status: DC

## 2022-09-11 NOTE — Telephone Encounter (Signed)
Pt calling. She would like to increase her Zoloft to '100mg'$  [facility]   Next Visit - 2/27 Last Visit - 1/23  CB# [phone]

## 2022-10-03 ENCOUNTER — Encounter (HOSPITAL_COMMUNITY): Payer: Self-pay | Admitting: Psychiatry

## 2022-10-03 ENCOUNTER — Telehealth (INDEPENDENT_AMBULATORY_CARE_PROVIDER_SITE_OTHER): Payer: Medicare Other | Admitting: Psychiatry

## 2022-10-03 DIAGNOSIS — F315 Bipolar disorder, current episode depressed, severe, with psychotic features: Secondary | ICD-10-CM

## 2022-10-03 DIAGNOSIS — F5102 Adjustment insomnia: Secondary | ICD-10-CM | POA: Diagnosis not present

## 2022-10-03 MED ORDER — OLANZAPINE 10 MG PO TABS: 10.0000 mg | ORAL_TABLET | Freq: Every day | ORAL | 1 refills | Status: DC

## 2022-10-03 NOTE — Progress Notes (Signed)
Patient ID: Amber Rice, female   DOB: 1956-03-30, 67 y.o.   MRN: [id number] [place] [id number] 67 y.o.  10/03/2022      Chief Complaint: Follow up bipolar disorder. Med review,     History of Present Illness:   Diagnosed with bipolar disorder depressed. Anxiety disorder NOS. Insomnia Last visit was feeling subdued, started zoloft helped and changed increased to '100mg'$  later by phone Doing better, still somewhat subdued, trying to add activities  Still on zyprexa,  Denies hallucinations , paranoia improved  No tremors Suffers from pain that makes her to take meds and it can add to tiredness  Otherwise compliant with meds, not delusional Not agitated   Duration of bipolar :20 plus years  Aggravating factor : past surgeries, non compliance history   Modifying factors; reading Severity better   Past Medical History:  Diagnosis Date   Degenerative disc disease, lumbar    diagnosed in 2005   Hyperlipidemia    Impacted cerumen of left ear 04/23/2017   Family History  Problem Relation Age of Onset   Cancer Mother    Heart attack Father    Hyperlipidemia Father    Cancer Maternal Grandmother    Cancer Paternal Grandmother    Bipolar disorder Paternal Uncle        Committed suicide in his 34's   Dementia Paternal Uncle    Depression Cousin    Schizophrenia Cousin    Multiple sclerosis Sister    Breast cancer Cousin    Alcohol abuse Neg Hx    Anxiety disorder Neg Hx     Outpatient Encounter Medications as of 10/03/2022  Medication Sig   divalproex (DEPAKOTE ER) 500 MG 24 hr tablet Take 1 tablet (500 mg total) by mouth 2 (two) times daily.   HYDROcodone-acetaminophen (NORCO) 7.5-325 MG tablet Take 1-2 tablets by mouth daily as needed for moderate pain (cough).   ibuprofen (ADVIL) 200 MG tablet Take by mouth.   OLANZapine (ZYPREXA) 10 MG tablet Take 1 tablet (10 mg total) by mouth at bedtime.   rOPINIRole  (REQUIP) 0.5 MG tablet Take 1 tablet (0.5 mg total) by mouth at bedtime.   sertraline (ZOLOFT) 100 MG tablet Take 1 tablet (100 mg total) by mouth daily.   [DISCONTINUED] OLANZapine (ZYPREXA) 10 MG tablet Take 1 tablet (10 mg total) by mouth at bedtime.   No facility-administered encounter medications on file as of 10/03/2022.    No results found for this or any previous visit (from the past 2160 hour(s)).    There were no vitals taken for this visit.   Review of Systems  Musculoskeletal:  Positive for back pain.  Neurological:  Negative for tremors.  Psychiatric/Behavioral:  Negative for substance abuse and suicidal ideas.     Mental Status Examination  Appearance: casual Alert: Yes Attention: fair  Cooperative: Yes Eye Contact:  Speech: normal Psychomotor Activity: Decreased Memory/Concentration: adequate Oriented: person, place, time/date and situation Mood: improved Affect: congruent Thought Processes and Associations: Coherent, not paranoid Fund of Knowledge: Fair Thought Content: Suicidal ideation and Homicidal ideation were denied Insight: Fair Judgement: Fair  Diagnosis: Bipolar disorder type I manic per history for psychotic features. Insomnia NOS. Adjustment disorder with anxiety . EPS Treatment Plan:   Prior documentation reviewed  1. Bipolar: depression : some better mood, continue zoloft, depakote, olanzapine  2. Depression: improving continue zoloft, olanzapine    3. Insomnia: reviewed sleep hhygine, continue  olanzapine  Fu  2-57m  Direct  care time spent in office 20 min including face to face, chart review Compliance discussed [name], MD

## 2022-10-04 ENCOUNTER — Other Ambulatory Visit (HOSPITAL_COMMUNITY): Payer: Self-pay | Admitting: Psychiatry

## 2022-10-12 ENCOUNTER — Telehealth (HOSPITAL_COMMUNITY): Payer: Self-pay | Admitting: *Deleted

## 2022-10-12 MED ORDER — OLANZAPINE 10 MG PO TABS: 10.0000 mg | ORAL_TABLET | Freq: Every day | ORAL | 0 refills | Status: DC

## 2022-10-12 NOTE — Telephone Encounter (Signed)
[id number] [place] [place] [facility] [address] 90 day Prescription Request   OLANZapine (ZYPREXA) 10 MG tablet   Last appt   10/03/22 Next appt 12/01/22

## 2022-10-12 NOTE — Addendum Note (Signed)
Addended by: [name] on: 10/12/2022 11:43 AM   Modules accepted: Orders

## 2022-10-12 NOTE — Telephone Encounter (Signed)
Patient Requested Refill- divalproex (DEPAKOTE ER) 500 MG 24 hr tablet  [id number] [place], [place] [facility] [address]    Last appt   10/03/22 Next appt 12/01/22

## 2022-10-13 MED ORDER — DIVALPROEX SODIUM ER 500 MG PO TB24: 500.0000 mg | ORAL_TABLET | Freq: Two times a day (BID) | ORAL | 0 refills | Status: DC

## 2022-10-13 NOTE — Telephone Encounter (Signed)
Medication management - Message left for patient that Dr. [name] had sent in her requested new Depakote ER [facility] [place] and requested patient call back if any issues.

## 2022-10-13 NOTE — Addendum Note (Signed)
Addended by: [name] on: 10/13/2022 08:47 AM   Modules accepted: Orders

## 2022-10-18 ENCOUNTER — Other Ambulatory Visit: Payer: Self-pay | Admitting: Neurology

## 2022-10-18 NOTE — Telephone Encounter (Signed)
Lvm for patient to call back to schedule an appointment with [facility] for med refill. tvt

## 2022-10-18 NOTE — Telephone Encounter (Signed)
Has to be seen within 3 months to have controlled substance refilled. Make appt for chronic pain contract up date.

## 2022-10-18 NOTE — Telephone Encounter (Signed)
Please call patient to schedule appt 

## 2022-10-18 NOTE — Telephone Encounter (Signed)
Patient LVM wanting a refill of hydrocodone.   Last appt 05/10/2022 (saw Dr. Darene Lamer 07/11/2022)  Last written 08/14/2022 #60 no refills

## 2022-10-20 ENCOUNTER — Encounter: Payer: Self-pay | Admitting: Physician Assistant

## 2022-10-20 ENCOUNTER — Other Ambulatory Visit: Payer: Self-pay | Admitting: Physician Assistant

## 2022-10-20 ENCOUNTER — Ambulatory Visit (INDEPENDENT_AMBULATORY_CARE_PROVIDER_SITE_OTHER): Payer: Medicare Other | Admitting: Physician Assistant

## 2022-10-20 VITALS — BP 116/50 | HR 72 | Ht 65.0 in | Wt 199.0 lb

## 2022-10-20 DIAGNOSIS — G2581 Restless legs syndrome: Secondary | ICD-10-CM | POA: Diagnosis not present

## 2022-10-20 DIAGNOSIS — M5441 Lumbago with sciatica, right side: Secondary | ICD-10-CM

## 2022-10-20 DIAGNOSIS — Z1159 Encounter for screening for other viral diseases: Secondary | ICD-10-CM

## 2022-10-20 DIAGNOSIS — G8929 Other chronic pain: Secondary | ICD-10-CM

## 2022-10-20 DIAGNOSIS — M5442 Lumbago with sciatica, left side: Secondary | ICD-10-CM

## 2022-10-20 DIAGNOSIS — G894 Chronic pain syndrome: Secondary | ICD-10-CM | POA: Diagnosis not present

## 2022-10-20 DIAGNOSIS — Z79899 Other long term (current) drug therapy: Secondary | ICD-10-CM

## 2022-10-20 MED ORDER — ROPINIROLE HCL 0.5 MG PO TABS: 1.0000 mg | ORAL_TABLET | Freq: Every day | ORAL | 1 refills | Status: DC

## 2022-10-20 NOTE — Progress Notes (Signed)
Established Patient Office Visit  Subjective   Patient ID: Amber Rice, female    DOB: 1956/04/28  Age: 67 y.o. MRN: [id number]  Chief Complaint  Patient presents with   Follow-up    HPI Pt is a 67 yo obese female with chronic pain due to OA, RLS, Bipolar who presents to the clinic for medication refills.   Pt's RLS is getting worse at night and needing more requip. Would like increase.   Her pain is fairly controlled. She takes on average one norco a day about 60 every 2 months. She needs refills.   BH manages Bipolar.  .. Active Ambulatory Problems    Diagnosis Date Noted   Hyperlipidemia 07/09/2013   GERD (gastroesophageal reflux disease) 07/09/2013   DDD (degenerative disc disease), lumbosacral 07/09/2013   Bipolar I disorder, current or most recent episode depressed, with psychotic features with mixed features ([place]) 07/09/2013   Osteoarthritis of left glenohumeral joint 04/30/2014   Status post reverse arthroplasty of shoulder, left 03/01/2015   Bilateral carpal tunnel syndrome 05/03/2015   DDD (degenerative disc disease), cervical 05/03/2015   Chronic SI joint pain 09/21/2015   Leukocytosis 01/14/2016   Acquired hypothyroidism 01/14/2016   Callus 05/08/2016   Tobacco abuse 10/12/2016   Colon polyp 01/04/2018   BMI 35.0-35.9,adult 12/19/2018   Myalgia 06/23/2019   Chronic cough 07/23/2019   Arthralgia 07/23/2019   Glossitis 08/14/2019   Primary osteoarthritis of right ankle 08/21/2019   Bilateral lower extremity edema 10/22/2019   Elevated blood pressure reading 10/24/2019   Proteinuria 10/24/2019   Weakness 12/22/2019   Fatigue 12/22/2019   Chronic venous stasis 01/19/2020   RLS (restless legs syndrome) 04/16/2020   Chronic pain syndrome 05/05/2020   Right upper quadrant guarding 10/18/2020   Hepatic steatosis 10/18/2020   Abdominal distension (gaseous) 10/18/2020   Seasonal allergic rhinitis due to pollen 11/29/2020   Fibromyalgia 03/07/2021    Dupuytren's contracture of left hand 03/07/2021   Leg edema, right 03/07/2021   Trigger finger, left middle finger 03/17/2021   Medication side effect 11/09/2021   Prominent xiphoid 05/11/2022   Resolved Ambulatory Problems    Diagnosis Date Noted   Low back pain with sciatica 07/09/2013   Lumbar disc herniation with radiculopathy 05/03/2015   Impacted cerumen of left ear 04/23/2017   Chronic bilateral low back pain with bilateral sciatica 11/11/2019   Past Medical History:  Diagnosis Date   Degenerative disc disease, lumbar      ROS See HPI.    Objective:     BP (!) 116/50   Pulse 72   Ht 5\' 5"  (1.651 m)   Wt 199 lb (90.3 kg)   SpO2 99%   BMI 33.12 kg/m  BP Readings from Last 3 Encounters:  10/20/22 (!) 116/50  05/11/22 (!) 143/88  09/20/21 (!) 169/77   Wt Readings from Last 3 Encounters:  10/20/22 199 lb (90.3 kg)  05/10/22 174 lb (78.9 kg)  11/09/21 200 lb (90.7 kg)      Physical Exam Constitutional:      Appearance: Normal appearance. She is obese.  HENT:     Head: Normocephalic.  Cardiovascular:     Rate and Rhythm: Normal rate and regular rhythm.  Pulmonary:     Effort: Pulmonary effort is normal.  Musculoskeletal:     Right lower leg: No edema.     Left lower leg: No edema.  Neurological:     General: No focal deficit present.     Mental Status: She is  alert and oriented to person, place, and time.  Psychiatric:        Mood and Affect: Mood normal.       The 10-year ASCVD risk score (Arnett DK, et al., 2019) is: 11%    Assessment & Plan:  Amber Rice KitchenSeerit was seen today for follow-up.  Diagnoses and all orders for this visit:  Chronic pain syndrome -     COMPLETE METABOLIC PANEL WITH GFR  Medication management -     COMPLETE METABOLIC PANEL WITH GFR  RLS (restless legs syndrome) -     COMPLETE METABOLIC PANEL WITH GFR -     rOPINIRole (REQUIP) 0.5 MG tablet; Take 2 tablets (1 mg total) by mouth at bedtime.  Chronic bilateral low  back pain with bilateral sciatica -     COMPLETE METABOLIC PANEL WITH GFR  Encounter for hepatitis C screening test for low risk patient -     Hepatitis C Antibody     Pt is UTD with pain contract and UDS .Amber KitchenPDMP reviewed during this encounter. No concerns.  1/9 last norco refill and taking once or twice a day Follow up in 3 months via [place] database  Increased requip to 1mg  at bedtime Declined smoking cessation Discussed massage and warm compresses for RLS.  Cmp ordered   Return in about 3 months (around 01/20/2023).    Iran Planas, PA-C

## 2022-10-23 ENCOUNTER — Other Ambulatory Visit: Payer: Self-pay | Admitting: Physician Assistant

## 2022-10-23 LAB — COMPLETE METABOLIC PANEL WITH GFR
AG Ratio: 1.6 (calc) (ref 1.0–2.5)
ALT: 10 U/L (ref 6–29)
AST: 10 U/L (ref 10–35)
Albumin: 4.1 g/dL (ref 3.6–5.1)
Alkaline phosphatase (APISO): 65 U/L (ref 37–153)
BUN: 19 mg/dL (ref 7–25)
CO2: 27 mmol/L (ref 20–32)
Calcium: 9 mg/dL (ref 8.6–10.4)
Chloride: 104 mmol/L (ref 98–110)
Creat: 0.86 mg/dL (ref 0.50–1.05)
Globulin: 2.5 g/dL (calc) (ref 1.9–3.7)
Glucose, Bld: 75 mg/dL (ref 65–99)
Potassium: 5 mmol/L (ref 3.5–5.3)
Sodium: 140 mmol/L (ref 135–146)
Total Bilirubin: 0.2 mg/dL (ref 0.2–1.2)
Total Protein: 6.6 g/dL (ref 6.1–8.1)
eGFR: 74 mL/min/{1.73_m2} (ref >=60)

## 2022-10-23 LAB — HEPATITIS C ANTIBODY: Hepatitis C Ab: NONREACTIVE

## 2022-10-23 MED ORDER — HYDROCODONE-ACETAMINOPHEN 7.5-325 MG PO TABS: 1.0000 | ORAL_TABLET | Freq: Every day | ORAL | 0 refills | Status: DC | PRN

## 2022-10-23 NOTE — Progress Notes (Signed)
Kidney, liver, glucose look great.

## 2022-10-23 NOTE — Telephone Encounter (Signed)
Patient last seen Friday, refill was not sent. Last filled in January, please sign.

## 2022-10-26 ENCOUNTER — Other Ambulatory Visit: Payer: Self-pay

## 2022-10-26 ENCOUNTER — Ambulatory Visit (INDEPENDENT_AMBULATORY_CARE_PROVIDER_SITE_OTHER): Payer: Medicare Other | Admitting: Sports Medicine

## 2022-10-26 ENCOUNTER — Other Ambulatory Visit (INDEPENDENT_AMBULATORY_CARE_PROVIDER_SITE_OTHER): Payer: Medicare Other

## 2022-10-26 DIAGNOSIS — M19071 Primary osteoarthritis, right ankle and foot: Secondary | ICD-10-CM

## 2022-10-26 DIAGNOSIS — M65332 Trigger finger, left middle finger: Secondary | ICD-10-CM

## 2022-10-26 NOTE — Assessment & Plan Note (Signed)
Pleasant 67 year old female, recurrence of left middle finger triggering, she also has a Dupuytren's contracture that is asymptomatic. We last injected this this in August 2022 and she did well until now, repeat left third flexor tendon sheath injection, return as needed.

## 2022-10-26 NOTE — Assessment & Plan Note (Signed)
Tibiotalar osteoarthritis, last injection was in December 2023, repeat right tibiotalar joint injection today. Return to see me as needed.

## 2022-10-26 NOTE — Progress Notes (Signed)
    Procedures performed today:    None.  Independent interpretation of notes and tests performed by another provider:   Procedure: Real-time Ultrasound Guided injection of the left third flexor tendon sheath Device: Samsung HS60  Verbal informed consent obtained.  Time-out conducted.  Noted no overlying erythema, induration, or other signs of local infection.  Skin prepped in a sterile fashion.  Local anesthesia: Topical Ethyl chloride.  With sterile technique and under real time ultrasound guidance: Noted flexor tendon nodule, 1/2 cc lidocaine, 1/2 cc kenalog 40 injected easily.   Completed without difficulty  Advised to call if fevers/chills, erythema, induration, drainage, or persistent bleeding.  Images permanently stored and available for review in PACS.  Impression: Technically successful ultrasound guided injection.  Procedure: Real-time Ultrasound Guided injection of the right tibiotalar joint Device: Samsung HS60  Verbal informed consent obtained.  Time-out conducted.  Noted no overlying erythema, induration, or other signs of local infection.  Skin prepped in a sterile fashion.  Local anesthesia: Topical Ethyl chloride.  With sterile technique and under real time ultrasound guidance: Noted arthritic joint, 1 cc Kenalog 40, 1 cc lidocaine, 1 cc bupivacaine injected easily Completed without difficulty  Advised to call if fevers/chills, erythema, induration, drainage, or persistent bleeding.  Images permanently stored and available for review in PACS.  Impression: Technically successful ultrasound guided injection.  Brief History, Exam, Impression, and Recommendations:    Trigger finger, left middle finger Pleasant 67 year old female, recurrence of left middle finger triggering, she also has a Dupuytren's contracture that is asymptomatic. We last injected this this in August 2022 and she did well until now, repeat left third flexor tendon sheath injection, return as  needed.  Primary osteoarthritis of right ankle Tibiotalar osteoarthritis, last injection was in December 2023, repeat right tibiotalar joint injection today. Return to see me as needed.    ____________________________________________ Gwen Her. Dianah Field, M.D., ABFM., CAQSM., AME. Primary Care and Sports Medicine [facility]  Adjunct Professor of [place] of [facility] of Medicine  Risk manager

## 2022-11-16 ENCOUNTER — Ambulatory Visit (INDEPENDENT_AMBULATORY_CARE_PROVIDER_SITE_OTHER): Payer: Medicare Other | Admitting: Sports Medicine

## 2022-11-16 ENCOUNTER — Other Ambulatory Visit: Payer: Self-pay | Admitting: Sports Medicine

## 2022-11-16 DIAGNOSIS — M5137 Other intervertebral disc degeneration, lumbosacral region: Secondary | ICD-10-CM

## 2022-11-16 DIAGNOSIS — M51379 Other intervertebral disc degeneration, lumbosacral region without mention of lumbar back pain or lower extremity pain: Secondary | ICD-10-CM

## 2022-11-16 MED ORDER — TRIAZOLAM 0.25 MG PO TABS: ORAL_TABLET | ORAL | 0 refills | Status: DC

## 2022-11-16 MED ORDER — GABAPENTIN 800 MG PO TABS: 800.0000 mg | ORAL_TABLET | Freq: Two times a day (BID) | ORAL | 3 refills | Status: DC

## 2022-11-16 NOTE — Progress Notes (Addendum)
    Procedures performed today:    None.  Independent interpretation of notes and tests performed by another provider:   None.  Brief History, Exam, Impression, and Recommendations:    DDD (degenerative disc disease), lumbosacral [name] returns, she is a 66 year old female, we have been treating her for some time for lumbar radiculopathy. Historically this was more of an L5 radiculitis on the right. Unfortunately she failed prednisone, Neurontin, home conditioning. She did this for over 6 weeks. Unfortunately she never followed up. She is now off gabapentin, she would like a refill as this was significantly helpful, at this point I think we should proceed with MRI for epidural planning, likely right L5-S1 transforaminal. She will need triazolam for preprocedural anxiolysis. She would like me to go ahead and order the epidural as soon as I see the MRI results.  Update: Multilevel pinched nerves and herniated disks, proceeding with right L5-S1 transforaminal epidural    ____________________________________________ Ihor Austin. Benjamin Stain, M.D., ABFM., CAQSM., AME. Primary Care and Sports Medicine [facility]  Adjunct Professor of Family Medicine  [place] of [facility] of Medicine  Restaurant manager, fast food

## 2022-11-16 NOTE — Assessment & Plan Note (Addendum)
Amber Rice returns, she is a 67 year old female, we have been treating her for some time for lumbar radiculopathy. Historically this was more of an L5 radiculitis on the right. Unfortunately she failed prednisone, Neurontin, home conditioning. She did this for over 6 weeks. Unfortunately she never followed up. She is now off gabapentin, she would like a refill as this was significantly helpful, at this point I think we should proceed with MRI for epidural planning, likely right L5-S1 transforaminal. She will need triazolam for preprocedural anxiolysis. She would like me to go ahead and order the epidural as soon as I see the MRI results.  Update: Multilevel pinched nerves and herniated disks, proceeding with right L5-S1 transforaminal epidural

## 2022-11-18 ENCOUNTER — Ambulatory Visit (INDEPENDENT_AMBULATORY_CARE_PROVIDER_SITE_OTHER): Payer: Medicare Other

## 2022-11-18 DIAGNOSIS — M5137 Other intervertebral disc degeneration, lumbosacral region: Secondary | ICD-10-CM

## 2022-11-18 DIAGNOSIS — M545 Low back pain, unspecified: Secondary | ICD-10-CM

## 2022-11-22 ENCOUNTER — Telehealth: Payer: Self-pay | Admitting: Sports Medicine

## 2022-11-22 DIAGNOSIS — M5137 Other intervertebral disc degeneration, lumbosacral region: Secondary | ICD-10-CM

## 2022-11-22 DIAGNOSIS — M51379 Other intervertebral disc degeneration, lumbosacral region without mention of lumbar back pain or lower extremity pain: Secondary | ICD-10-CM

## 2022-11-22 NOTE — Telephone Encounter (Signed)
Patient is requesting referral to Dr. Peggye Pitt to have an epidural. Please advise.

## 2022-11-22 NOTE — Addendum Note (Signed)
Addended by: Monica Becton on: 11/22/2022 09:28 AM   Modules accepted: Orders

## 2022-11-23 NOTE — Telephone Encounter (Signed)
Referral placed.

## 2022-11-23 NOTE — Telephone Encounter (Signed)
Referral, clinical notes and copies of insurance cards have been faxed to [facility] at [phone]. Office will contact patient to schedule referral appointment.

## 2022-11-23 NOTE — Telephone Encounter (Signed)
Left message on patients voicemail updating patient on referral status. Referral received and faxed to Dr. Jon Gills office.

## 2022-11-25 ENCOUNTER — Other Ambulatory Visit: Payer: Self-pay | Admitting: Physician Assistant

## 2022-11-27 MED ORDER — HYDROCODONE-ACETAMINOPHEN 7.5-325 MG PO TABS: 1.0000 | ORAL_TABLET | Freq: Every day | ORAL | 0 refills | Status: DC | PRN

## 2022-12-01 ENCOUNTER — Telehealth (INDEPENDENT_AMBULATORY_CARE_PROVIDER_SITE_OTHER): Payer: Medicare Other | Admitting: Psychiatry

## 2022-12-01 ENCOUNTER — Encounter (HOSPITAL_COMMUNITY): Payer: Self-pay | Admitting: Psychiatry

## 2022-12-01 DIAGNOSIS — F5102 Adjustment insomnia: Secondary | ICD-10-CM | POA: Diagnosis not present

## 2022-12-01 DIAGNOSIS — G8929 Other chronic pain: Secondary | ICD-10-CM

## 2022-12-01 DIAGNOSIS — F315 Bipolar disorder, current episode depressed, severe, with psychotic features: Secondary | ICD-10-CM

## 2022-12-01 NOTE — Progress Notes (Signed)
Patient ID: Amber Rice, female   DOB: 1956/04/09, 67 y.o.   MRN: [id number] [facility] Follow-up Outpatient   Amber Rice 67 y.o.  12/01/2022    Virtual Visit via Video Note  I connected with Amber Rice on 12/01/22 at 12:30 PM EDT by a video enabled telemedicine application and verified that I am speaking with the correct person using two identifiers.  Location: Patient: home Provider: home office   I discussed the limitations of evaluation and management by telemedicine and the availability of in person appointments. The patient expressed understanding and agreed to proceed.      I discussed the assessment and treatment plan with the patient. The patient was provided an opportunity to ask questions and all were answered. The patient agreed with the plan and demonstrated an understanding of the instructions.   The patient was advised to call back or seek an in-person evaluation if the symptoms worsen or if the condition fails to improve as anticipated.  I provided 20 minutes of non-face-to-face time during this encounter.    Chief Complaint: Follow up bipolar disorder. Med review,     History of Present Illness:   Diagnosed with bipolar disorder depressed. Anxiety disorder NOS. Insomnia Doing better , zoloft was started last visit, helped the depression  Less paranoid, busy with church group Tried to connect with sister but she could not  Not agitated or hallucinating, she feels better   Duration of bipolar :20 plus years  Aggravating factor : past surgeries, non compliance history   Modifying factors; reading, church Severity  better  Past Medical History:  Diagnosis Date   Degenerative disc disease, lumbar    diagnosed in 2005   Hyperlipidemia    Impacted cerumen of left ear 04/23/2017   Family History  Problem Relation Age of Onset   Cancer Mother    Heart attack Father    Hyperlipidemia Father    Cancer Maternal  Grandmother    Cancer Paternal Grandmother    Bipolar disorder Paternal Uncle        Committed suicide in his 63's   Dementia Paternal Uncle    Depression Cousin    Schizophrenia Cousin    Multiple sclerosis Sister    Breast cancer Cousin    Alcohol abuse Neg Hx    Anxiety disorder Neg Hx     Outpatient Encounter Medications as of 12/01/2022  Medication Sig   divalproex (DEPAKOTE ER) 500 MG 24 hr tablet Take 1 tablet (500 mg total) by mouth 2 (two) times daily.   gabapentin (NEURONTIN) 800 MG tablet Take 1-2 tablets (800-1,600 mg total) by mouth 2 (two) times daily.   HYDROcodone-acetaminophen (NORCO) 7.5-325 MG tablet Take 1-2 tablets by mouth daily as needed for moderate pain (cough).   ibuprofen (ADVIL) 200 MG tablet Take by mouth.   OLANZapine (ZYPREXA) 10 MG tablet Take 1 tablet (10 mg total) by mouth at bedtime.   rOPINIRole (REQUIP) 0.5 MG tablet Take 2 tablets (1 mg total) by mouth at bedtime.   sertraline (ZOLOFT) 100 MG tablet TAKE 1 TABLET BY MOUTH EVERY DAY   triazolam (HALCION) 0.25 MG tablet 1-2 tabs PO 2 hours before procedure or imaging.  Do not drive with this medication.   No facility-administered encounter medications on file as of 12/01/2022.    Recent Results (from the past 2160 hour(s))  COMPLETE METABOLIC PANEL WITH GFR     Status: None   Collection Time: 10/20/22 10:18 AM  Result Value Ref Range  Glucose, Bld 75 65 - 99 mg/dL    Comment: .            Fasting reference interval .    BUN 19 7 - 25 mg/dL   Creat 1.61 0.96 - 0.45 mg/dL   eGFR 74 > OR = 60 WU/JWJ/1.91Y7   BUN/Creatinine Ratio SEE NOTE: 6 - 22 (calc)    Comment:    Not Reported: BUN and Creatinine are within    reference range. .    Sodium 140 135 - 146 mmol/L   Potassium 5.0 3.5 - 5.3 mmol/L   Chloride 104 98 - 110 mmol/L   CO2 27 20 - 32 mmol/L   Calcium 9.0 8.6 - 10.4 mg/dL   Total Protein 6.6 6.1 - 8.1 g/dL   Albumin 4.1 3.6 - 5.1 g/dL   Globulin 2.5 1.9 - 3.7 g/dL (calc)   AG  Ratio 1.6 1.0 - 2.5 (calc)   Total Bilirubin 0.2 0.2 - 1.2 mg/dL   Alkaline phosphatase (APISO) 65 37 - 153 U/L   AST 10 10 - 35 U/L   ALT 10 6 - 29 U/L  Hepatitis C antibody     Status: None   Collection Time: 10/20/22 10:18 AM  Result Value Ref Range   Hepatitis C Ab NON-REACTIVE NON-REACTIVE    Comment: . HCV antibody was non-reactive. There is no laboratory  evidence of HCV infection. . In most cases, no further action is required. However, if recent HCV exposure is suspected, a test for HCV RNA (test code 82956) is suggested. . For additional information please refer to http://education.questdiagnostics.com/faq/FAQ22v1 (This link is being provided for informational/ educational purposes only.) .       There were no vitals taken for this visit.   Review of Systems  Neurological:  Negative for tremors.  Psychiatric/Behavioral:  Negative for depression, substance abuse and suicidal ideas.     Mental Status Examination  Appearance: casual Alert: Yes Attention: fair  Cooperative: Yes Eye Contact:  Speech: normal Psychomotor Activity: Decreased Memory/Concentration: adequate Oriented: person, place, time/date and situation Mood: improved Affect: congruent Thought Processes and Associations: Coherent, not paranoid Fund of Knowledge: Fair Thought Content: Suicidal ideation and Homicidal ideation were denied Insight: Fair Judgement: Fair  Diagnosis: Bipolar disorder type I manic per history for psychotic features. Insomnia NOS. Adjustment disorder with anxiety . EPS Treatment Plan:  Prior documentation reviewed   1. Bipolar: depression : better continue VPA, olanzapine and zoloft No side effects  2. Depression: better on zoloft     3. Insomnia: manageable continue zyprexa   Fu  2-29m.   Compliance discussed Thresa Ross, MD

## 2023-01-02 ENCOUNTER — Telehealth (HOSPITAL_COMMUNITY): Payer: Self-pay | Admitting: *Deleted

## 2023-01-02 ENCOUNTER — Other Ambulatory Visit: Payer: Self-pay | Admitting: Physician Assistant

## 2023-01-02 ENCOUNTER — Other Ambulatory Visit: Payer: Self-pay | Admitting: Psychiatry

## 2023-01-02 MED ORDER — SERTRALINE HCL 100 MG PO TABS: 100.0000 mg | ORAL_TABLET | Freq: Every day | ORAL | 0 refills | Status: DC

## 2023-01-02 NOTE — Telephone Encounter (Signed)
OLANZapine (ZYPREXA) 10 MG tablet Medication Date: 10/12/2022 Department: [facility] at [facility] Ordering/Authorizing: Thresa Ross, MD   Order Providers  Prescribing Provider Encounter Provider  Thresa Ross, MD Rushie Chestnut, RMA   Outpatient Medication Detail   Disp Refills Start End   OLANZapine [facility]) 10 MG tablet 90 tablet 0 10/12/2022 10/12/2023   Sig - Route: Take 1 tablet (10 mg total) by mouth at bedtime. - Oral   Sent to pharmacy as: OLANZapine (ZYPREXA) 10 MG tablet                                                && sertraline (ZOLOFT) 100 MG tablet Medication Date: 10/04/2022 Department: [facility] at [facility] Ordering/Authorizing: Thresa Ross, MD   Order Providers  Prescribing Provider Encounter Provider  Thresa Ross, MD Thresa Ross, MD   Outpatient Medication Detail   Disp Refills Start End   sertraline (ZOLOFT) 100 MG tablet 90 tablet 0 10/04/2022    Sig - Route: TAKE 1 TABLET BY MOUTH EVERY DAY - Oral   Sent to pharmacy as: sertraline (ZOLOFT) 100 MG tablet     Next Appt 03/02/23 Last Appt 12/01/22

## 2023-01-02 NOTE — Telephone Encounter (Signed)
Ordered

## 2023-01-03 ENCOUNTER — Other Ambulatory Visit: Payer: Self-pay | Admitting: Physician Assistant

## 2023-01-03 NOTE — Telephone Encounter (Signed)
Requesting refill on her hydrocodone.  I do not mind refilling but she does not have an appointment coming up she supposed to follow-up around June 15 with [facility].  Lets go ahead and get her scheduled and then I am happy to send over the refill for now.

## 2023-01-04 ENCOUNTER — Telehealth (HOSPITAL_COMMUNITY): Payer: Self-pay | Admitting: *Deleted

## 2023-01-04 ENCOUNTER — Other Ambulatory Visit: Payer: Self-pay | Admitting: Psychiatry

## 2023-01-04 MED ORDER — HYDROCODONE-ACETAMINOPHEN 7.5-325 MG PO TABS: 1.0000 | ORAL_TABLET | Freq: Every day | ORAL | 0 refills | Status: DC | PRN

## 2023-01-04 MED ORDER — OLANZAPINE 10 MG PO TABS: 10.0000 mg | ORAL_TABLET | Freq: Every day | ORAL | 0 refills | Status: DC

## 2023-01-04 NOTE — Telephone Encounter (Signed)
Patient informed. 

## 2023-01-04 NOTE — Telephone Encounter (Signed)
OLANZapine (ZYPREXA) 10 MG tablet [[id number]]   Order Details Dose: 10 mg Route: Oral Frequency: Daily at bedtime  Dispense Quantity: 90 tablet Refills: 0   Note to Pharmacy: Delete prior refills       Sig: Take 1 tablet (10 mg total) by mouth at bedtime.       Start Date: 03/07 /24 End Date: 10/12/23 after 365 doses  Written Date: 03/07/ 24     [id number] [place], [place] - [address]  [address] [place], [place] [address]

## 2023-01-04 NOTE — Telephone Encounter (Signed)
Called spoke with patient let her know to call her pharmacy about medication and scheduled an appt with Lesly Rubenstein on June 18th

## 2023-01-04 NOTE — Telephone Encounter (Signed)
Ordered

## 2023-01-04 NOTE — Telephone Encounter (Signed)
There are 2 notes open about this.  Please see the 1 from 528 that I had routed back requesting that patient schedule an appointment and then I will be happy to refill her medication.  She is due for an appointment around June 15.  I want to go ahead and get that on the books because I know [facility] can be pretty full.

## 2023-01-06 ENCOUNTER — Other Ambulatory Visit (HOSPITAL_COMMUNITY): Payer: Self-pay | Admitting: Psychiatry

## 2023-01-09 ENCOUNTER — Telehealth: Payer: Self-pay | Admitting: Physician Assistant

## 2023-01-09 NOTE — Telephone Encounter (Signed)
Pt. Wants to know if next visit can be mychart video visit instead of in person office visit on 01/23/2023. Please advise.  Patient also wants to know if  Ropinirole 1 mg can be increased to Ropinirole 2 mg. Please advise.

## 2023-01-12 ENCOUNTER — Telehealth: Payer: Self-pay | Admitting: Physician Assistant

## 2023-01-12 DIAGNOSIS — G2581 Restless legs syndrome: Secondary | ICD-10-CM

## 2023-01-12 MED ORDER — ROPINIROLE HCL 2 MG PO TABS: 2.0000 mg | ORAL_TABLET | Freq: Every day | ORAL | 0 refills | Status: DC

## 2023-01-12 NOTE — Telephone Encounter (Signed)
Patient called she needs refills  for Ropinirole 2mg  [facility] [address] [place]  [phone]

## 2023-01-23 ENCOUNTER — Telehealth (INDEPENDENT_AMBULATORY_CARE_PROVIDER_SITE_OTHER): Payer: Medicare Other | Admitting: Physician Assistant

## 2023-01-23 ENCOUNTER — Encounter: Payer: Self-pay | Admitting: Physician Assistant

## 2023-01-23 VITALS — Ht 65.0 in | Wt 210.0 lb

## 2023-01-23 DIAGNOSIS — G2581 Restless legs syndrome: Secondary | ICD-10-CM

## 2023-01-23 DIAGNOSIS — G894 Chronic pain syndrome: Secondary | ICD-10-CM | POA: Diagnosis not present

## 2023-01-23 NOTE — Progress Notes (Signed)
..Virtual Visit via Video Note  I connected with Amber Rice on 01/23/23 at  1:40 PM EDT by a video enabled telemedicine application and verified that I am speaking with the correct person using two identifiers.  Location: Patient: car Provider: clinic  .Marland KitchenParticipating in visit:  Patient: Amber Rice Provider: Tandy Gaw PA-C   I discussed the limitations of evaluation and management by telemedicine and the availability of in person appointments. The patient expressed understanding and agreed to proceed.  History of Present Illness: Patient is a [age]-year-old female with chronic pain syndrome due to degenerative disc disease who calls into the clinic for 74-month follow-up.  She is taking Norco 1-2 times a day for breakthrough pain.  She does see an orthopedist and they have scheduled epidural injections in the next month.  Restless leg has improved with increase in Requip to 2 mg at night.  She does not need refills today.  She is seeing behavioral health for her depression and anxiety.  She is doing better on medications.  . Active Ambulatory Problems    Diagnosis Date Noted   Hyperlipidemia 07/09/2013   GERD (gastroesophageal reflux disease) 07/09/2013   DDD (degenerative disc disease), lumbosacral 07/09/2013   Bipolar I disorder, current or most recent episode depressed, with psychotic features with mixed features ([facility]) 07/09/2013   Osteoarthritis of left glenohumeral joint 04/30/2014   Status post reverse arthroplasty of shoulder, left 03/01/2015   Bilateral carpal tunnel syndrome 05/03/2015   DDD (degenerative disc disease), cervical 05/03/2015   Chronic SI joint pain 09/21/2015   Leukocytosis 01/14/2016   Acquired hypothyroidism 01/14/2016   Callus 05/08/2016   Tobacco abuse 10/12/2016   Colon polyp 01/04/2018   BMI 35.0-35.9,adult 12/19/2018   Myalgia 06/23/2019   Chronic cough 07/23/2019   Arthralgia 07/23/2019   Glossitis 08/14/2019   Primary osteoarthritis of  right ankle 08/21/2019   Bilateral lower extremity edema 10/22/2019   Elevated blood pressure reading 10/24/2019   Proteinuria 10/24/2019   Weakness 12/22/2019   Fatigue 12/22/2019   Chronic venous stasis 01/19/2020   RLS (restless legs syndrome) 04/16/2020   Chronic pain syndrome 05/05/2020   Right upper quadrant guarding 10/18/2020   Hepatic steatosis 10/18/2020   Abdominal distension (gaseous) 10/18/2020   Seasonal allergic rhinitis due to pollen 11/29/2020   Fibromyalgia 03/07/2021   Dupuytren's contracture of left hand 03/07/2021   Leg edema, right 03/07/2021   Trigger finger, left middle finger 03/17/2021   Medication side effect 11/09/2021   Prominent xiphoid 05/11/2022   Resolved Ambulatory Problems    Diagnosis Date Noted   Low back pain with sciatica 07/09/2013   Lumbar disc herniation with radiculopathy 05/03/2015   Impacted cerumen of left ear 04/23/2017   Chronic bilateral low back pain with bilateral sciatica 11/11/2019   Past Medical History:  Diagnosis Date   Degenerative disc disease, lumbar         Observations/Objective: No acute distress Normal mood and appearance Normal breathing  .Marland Kitchen Today's Vitals   01/23/23 1309  Weight: 210 lb (95.3 kg)  Height: 5\' 5"  (1.651 m)   Body mass index is 34.95 kg/m.  Assessment and Plan: Marland KitchenMarland KitchenIvan was seen today for medication follow-up.  Diagnoses and all orders for this visit:  Chronic pain syndrome  RLS (restless legs syndrome)  Pain contract 05/2022 UDS 05/2022 .Marland KitchenPDMP reviewed during this encounter. No concerns Follow up in 3 months Scheduled for epidural injection  Continue requip for RLS   Follow Up Instructions:    I discussed the  assessment and treatment plan with the patient. The patient was provided an opportunity to ask questions and all were answered. The patient agreed with the plan and demonstrated an understanding of the instructions.   The patient was advised to call back or  seek an in-person evaluation if the symptoms worsen or if the condition fails to improve as anticipated.   Tandy Gaw, PA-C

## 2023-01-26 NOTE — Telephone Encounter (Signed)
Erroneous encounter

## 2023-02-13 ENCOUNTER — Other Ambulatory Visit: Payer: Self-pay | Admitting: Family Medicine

## 2023-02-13 MED ORDER — HYDROCODONE-ACETAMINOPHEN 7.5-325 MG PO TABS: 1.0000 | ORAL_TABLET | Freq: Every day | ORAL | 0 refills | Status: DC | PRN

## 2023-02-13 NOTE — Telephone Encounter (Signed)
..  PDMP reviewed during this encounter. Sent norco.

## 2023-02-16 ENCOUNTER — Telehealth: Payer: Self-pay | Admitting: Physician Assistant

## 2023-02-16 NOTE — Telephone Encounter (Signed)
Patient is ask  for Hydrocodone-aceminophen 7.5-325mg  be changed to 10mg -325mg  pharmacyis unable to get her original strength  [facility] [address] [place] [address] [phone]

## 2023-02-21 ENCOUNTER — Telehealth: Payer: Self-pay | Admitting: Physician Assistant

## 2023-02-21 NOTE — Telephone Encounter (Signed)
Amber Rice Erasmo Score  [phone] requesting pcp to sign death certificate on Patient DOD 02-19-23

## 2023-02-22 NOTE — Telephone Encounter (Signed)
Signed this AM.

## 2023-02-26 NOTE — Telephone Encounter (Signed)
13th

## 2023-03-02 ENCOUNTER — Telehealth (HOSPITAL_COMMUNITY): Payer: Medicare Other | Admitting: Psychiatry

## 2023-03-05 ENCOUNTER — Encounter (HOSPITAL_COMMUNITY): Payer: Self-pay | Admitting: Psychiatry

## 2023-03-08 DEATH — deceased

## 2023-04-09 ENCOUNTER — Other Ambulatory Visit: Payer: Self-pay | Admitting: Physician Assistant
